# Patient Record
Sex: Male | Born: 1935 | Race: White | Hispanic: No | Marital: Married | Smoking: Former smoker
Health system: Southern US, Community
[De-identification: ages and names within clinical notes are randomized; demographics above are authoritative.]

## PROBLEM LIST (undated history)

## (undated) DIAGNOSIS — L905 Scar conditions and fibrosis of skin: Secondary | ICD-10-CM

## (undated) DIAGNOSIS — N429 Disorder of prostate, unspecified: Secondary | ICD-10-CM

## (undated) DIAGNOSIS — I519 Heart disease, unspecified: Secondary | ICD-10-CM

## (undated) DIAGNOSIS — H3530 Unspecified macular degeneration: Secondary | ICD-10-CM

## (undated) DIAGNOSIS — I2510 Atherosclerotic heart disease of native coronary artery without angina pectoris: Secondary | ICD-10-CM

## (undated) DIAGNOSIS — Z9861 Coronary angioplasty status: Secondary | ICD-10-CM

## (undated) DIAGNOSIS — I714 Abdominal aortic aneurysm, without rupture: Secondary | ICD-10-CM

## (undated) DIAGNOSIS — M1990 Unspecified osteoarthritis, unspecified site: Secondary | ICD-10-CM

## (undated) DIAGNOSIS — R251 Tremor, unspecified: Secondary | ICD-10-CM

## (undated) DIAGNOSIS — I2109 ST elevation (STEMI) myocardial infarction involving other coronary artery of anterior wall: Secondary | ICD-10-CM

## (undated) DIAGNOSIS — I4891 Unspecified atrial fibrillation: Secondary | ICD-10-CM

## (undated) HISTORY — PX: APPENDECTOMY: SHX54

## (undated) HISTORY — PX: OTHER SURGICAL HISTORY: SHX169

---

## 1998-03-24 HISTORY — PX: HERNIA REPAIR: SHX51

## 2001-11-12 ENCOUNTER — Encounter: Admission: RE | Admit: 2001-11-12 | Discharge: 2001-11-12 | Payer: Self-pay | Admitting: Internal Medicine

## 2001-11-12 ENCOUNTER — Encounter: Payer: Self-pay | Admitting: Internal Medicine

## 2001-11-26 ENCOUNTER — Ambulatory Visit (HOSPITAL_COMMUNITY): Admission: RE | Admit: 2001-11-26 | Discharge: 2001-11-26 | Payer: Self-pay | Admitting: Gastroenterology

## 2001-11-26 ENCOUNTER — Encounter (INDEPENDENT_AMBULATORY_CARE_PROVIDER_SITE_OTHER): Payer: Self-pay | Admitting: *Deleted

## 2002-12-30 ENCOUNTER — Encounter: Payer: Self-pay | Admitting: Internal Medicine

## 2002-12-30 ENCOUNTER — Encounter: Admission: RE | Admit: 2002-12-30 | Discharge: 2002-12-30 | Payer: Self-pay | Admitting: Internal Medicine

## 2003-01-12 ENCOUNTER — Ambulatory Visit: Admission: RE | Admit: 2003-01-12 | Discharge: 2003-01-12 | Payer: Self-pay | Admitting: Internal Medicine

## 2003-07-10 ENCOUNTER — Encounter: Admission: RE | Admit: 2003-07-10 | Discharge: 2003-07-10 | Payer: Self-pay | Admitting: Internal Medicine

## 2004-01-23 ENCOUNTER — Encounter: Admission: RE | Admit: 2004-01-23 | Discharge: 2004-01-23 | Payer: Self-pay | Admitting: Internal Medicine

## 2004-10-24 ENCOUNTER — Emergency Department (HOSPITAL_COMMUNITY): Admission: EM | Admit: 2004-10-24 | Discharge: 2004-10-24 | Payer: Self-pay | Admitting: Emergency Medicine

## 2005-02-25 ENCOUNTER — Encounter: Admission: RE | Admit: 2005-02-25 | Discharge: 2005-02-25 | Payer: Self-pay | Admitting: Internal Medicine

## 2006-05-01 ENCOUNTER — Encounter: Admission: RE | Admit: 2006-05-01 | Discharge: 2006-05-01 | Payer: Self-pay | Admitting: Internal Medicine

## 2008-06-01 ENCOUNTER — Ambulatory Visit (HOSPITAL_BASED_OUTPATIENT_CLINIC_OR_DEPARTMENT_OTHER): Admission: RE | Admit: 2008-06-01 | Discharge: 2008-06-01 | Payer: Self-pay | Admitting: Orthopedic Surgery

## 2009-05-23 ENCOUNTER — Encounter: Admission: RE | Admit: 2009-05-23 | Discharge: 2009-05-23 | Payer: Self-pay | Admitting: Internal Medicine

## 2010-07-04 LAB — BASIC METABOLIC PANEL
CO2: 25 mEq/L (ref 19–32)
Calcium: 9.7 mg/dL (ref 8.4–10.5)
Creatinine, Ser: 0.99 mg/dL (ref 0.4–1.2)
Glucose, Bld: 119 mg/dL — ABNORMAL HIGH (ref 70–99)
Potassium: 4.2 mEq/L (ref 3.5–5.1)

## 2010-08-06 NOTE — Op Note (Signed)
[name], [name] NO.:  [id number]   MEDICAL RECORD NO.:  [id number]          PATIENT TYPE:  AMB   LOCATION:  DSC                          FACILITY:  [facility]   PHYSICIAN:  Katy Fitch. Sypher, M.D. DATE OF BIRTH:  January 01, 1932   DATE OF PROCEDURE:  06/01/2008  DATE OF DISCHARGE:                               OPERATIVE REPORT   POSTOPERATIVE DIAGNOSES:  1. Severe degenerative arthritis, right thumb interphalangeal joint      with large dorsal radial loose body and mucoid cyst at dorsoradial      nail fold.  2. Chronic degenerative arthritis, right ring finger distal      interphalangeal joint with large loose body dorsally, small loose      body radially, and central dorsal nail fold mucoid cyst with nail      deformity.   POSTOPERATIVE DIAGNOSES:  1. Severe degenerative arthritis, right thumb interphalangeal joint      with large dorsal radial loose body and mucoid cyst at dorsoradial      nail fold.  2. Chronic degenerative arthritis, right ring finger distal      interphalangeal joint with large loose body dorsally, small loose      body radially, and central dorsal nail fold mucoid cyst with nail      deformity.   OPERATION:  1. Arthrotomy, right thumb interphalangeal joint with debridement of      loose bodies, synovectomy, and removal of marginal osteophytes from      the base of distal phalanx and proximal phalangeal head.  2. Debridement of nail fold mucoid cyst, right thumb.  3. Arthrotomy, right ring finger distal interphalangeal joint with      removal of a large central dorsal loose body, synovectomy, and      removal of multiple small loose bodies adjacent to the radial      collateral ligament.  4. Removal of a central dorsal nail fold cyst that was compressing the      germinal nail matrix.   OPERATIONS:  Katy Fitch. Sypher, MD   ASSISTANT:  Marveen Reeks. Dasnoit, PA-C   ANESTHESIA:  2% lidocaine metacarpal head level block, right thumb and  right ring finger, supplemented by IV sedation.   SUPERVISING ANESTHESIOLOGIST:  Maren Beach, MD   INDICATIONS:  [name] is a 77 year old gentleman who employed by  [employer].  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   He was referred through the courtesy of Dr. Theressa Millard for evaluation  and management of chronic discomfort, mucoid formation, and degenerative  arthritis signs involving his right hand.  He had a very large horn-like  osteophyte/loose body on the dorsal radial aspect of his right thumb IP  joint.  He had a large mucoid cyst distal to this in the radial nail  wall.  X-rays revealed marked degenerative arthritis of all his  interphalangeal joints with a very large fractured osteophyte/loose body  in the IP joint of the right thumb.  X-rays of the ring finger revealed  a small  loose body deep to the radial collateral ligament and large  loose body dorsally.  He had a central dorsal nail fold cyst involving  the ring finger that was causing deformity of the nail.   He requested debridement.  We explained that we could debride the  joints, remove the loose bodies, and typically the mucoid cyst will  abate.  Once the pressure of the cyst is removed, his nail deformity  should correct overtime.   After informed consent, [name] is brought to the operating room at  this time.   PROCEDURE:  [name] was brought to room #8 of the [facility] and placed in the supine position upon the operating table.  Following Betadine prep, 2% lidocaine metacarpal head level blocks were  placed to obtain anesthesia of the right thumb and right ring finger.  As anesthesia set up, the arm and hand were prepped with Betadine soap  and solution, sterilely draped.  A pneumatic tourniquet was not  required.   The thumb was exsanguinated with a gauze wrap and a strip of Esmarch was  used as a digital tourniquet.  The  procedure commenced with an  elliptical excision of the mucoid cyst followed by a curvilinear  incision exposing the dorsoradial aspect of the IP joint.  A large  fractured osteophyte was debrided measuring 6 x 5 mm.  A complete  synovectomy was accomplished over the dorsal aspect of the IP joint  followed by power irrigation with a 20 mL syringe and a 19-gauge blunt  needle.  A microcurette was used to remove osteophytes at the proximal  phalangeal head and at the base of the distal phalanx.  We could not  identify other loose bodies due to the collateral ligaments.   The skin adjacent to the mucoid cyst was debrided with micro-rongeur.  The wound was then repaired with mattress sutures of 5-0 nylon tailoring  the defect created by cyst excision.  Attention was then directed to the  ring finger.  The ring finger was exsanguinated with a gauze wrap and a  strip of Esmarch was used as a digital tourniquet.  The dorsoradial  aspect of the joint was exposed through a curvilinear incision followed  by resection of the capsule between the terminal extensor tendon slip  and the radial collateral ligament.  Debridement of the joint removed a  4 x 3 mm loose body and a smaller loose body deep to the radial  collateral ligament.  After synovectomy, marginal osteophytes were  trimmed with a micro-rongeur and a microcurette.  The mucoid cyst was  then debrided through a second incision dorsally.  The connection  between the joint and the cyst was sounded and appeared to be coming  directly through the central portion of the extensor tendon.   After thorough debridement, the joint was irrigated with a 19 gauge  needle under pressure followed by repair of the skin wounds with  interrupted suture of 5-0 nylon.   The tourniquet was released after dressings were applied with Xeroflo  sterile gauze and Coban.  There were no apparent complications.   [name] tolerated the surgery and anesthesia well.   For aftercare, he  is provided a prescription for Vicodin 5 mg 1 p.o. q.4-6 hours p.r.n.  pain, 20 tablets without refill and Keflex 500 mg 1 p.o. q.8 h. x4 days  as prophylactic antibiotic.   He will return to see [place] in the office for followup in 1 week  or sooner  p.r.n. problems.      Katy Fitch Sypher, M.D.  Electronically Signed    RVS/MEDQ  D:  06/01/2008  T:  06/02/2008  Job:  [id number]   cc:   Theressa Millard, M.D.

## 2010-08-09 NOTE — Op Note (Signed)
   NAME:  [name], [name] NO.:  [id number]   MEDICAL RECORD NO.:  [id number]                   PATIENT TYPE:  AMB   LOCATION:  ENDO                                 FACILITY:  [facility]   PHYSICIAN:  Charolett Bumpers, M.D.             DATE OF BIRTH:  1931/11/16   DATE OF PROCEDURE:  DATE OF DISCHARGE:  11/26/2001                                 OPERATIVE REPORT   PROCEDURE INDICATION:  The patient is a 77 year-old male born May 12, 1931.  The patient has had recurrent bouts of diverticulitis.  He is  scheduled to undergo diagnostic colonoscopy.  I discussed with the patient  the complications associated with colonoscopy and polypectomy including a 15  per 1000 risk of bleeding and 4 per 1000 risk of colon perforation requiring  surgical repair.  The patient has signed the operative permit.   ENDOSCOPIST:  Charolett Bumpers, M.D.   PREMEDICATION:  Versed 5 mg and Fentanyl 50 mcg.   ENDOSCOPE:  Olympus pediatric colonoscope.   PROCEDURE:  After obtaining informed consent the patient was placed in the  left lateral decubitus position.  I administered intravenous Fentanyl and  intravenous Versed to achieve conscious sedation for the procedure.  The  patient's blood pressure, oxygen saturation and cardiac rhythm were  monitored throughout the procedure and documented in the medical record.   Anal inspection was normal.  Digital rectal exam was normal.  The prostate  is non-nodular.  The Olympus pediatric video colonoscope was introduced into  the rectum and easily advanced to the cecum.  Colonic preparation for the  exam today was excellent.   Rectum:  Normal.   Sigmoid colon, descending colon and splenic flexure:  Left colonic  diverticulosis is noted.  There is segmental colitis involving the splenic  flexure and descending colon.  The colonic mucosa is red and friable without  ulcers.  Multiple biopsies were taken.   Transverse colon:   Normal.   Hepatic flexure:  Normal.   Ascending colon:  Normal.   Cecum and ileocecal valve:  Normal.    ASSESSMENT:  Left colonic diverticulosis associated with a short segment of  mucosal colitis involving the splenic flexure and proximal descending colon  with biopsies pending.  No endoscopic evidence for the presence of  colorectal neoplasia.                                               Charolett Bumpers, M.D.    MKJ/MEDQ  D:  11/26/2001  T:  11/28/2001  Job:  [id number]   cc:   Winn Jock. [name], M.D.

## 2011-01-08 ENCOUNTER — Other Ambulatory Visit: Payer: Self-pay | Admitting: Dermatology

## 2012-06-23 ENCOUNTER — Other Ambulatory Visit: Payer: Self-pay | Admitting: Dermatology

## 2013-01-12 ENCOUNTER — Other Ambulatory Visit: Payer: Self-pay | Admitting: Internal Medicine

## 2013-01-12 DIAGNOSIS — I714 Abdominal aortic aneurysm, without rupture, unspecified: Secondary | ICD-10-CM

## 2013-01-14 ENCOUNTER — Ambulatory Visit
Admission: RE | Admit: 2013-01-14 | Discharge: 2013-01-14 | Disposition: A | Discharge status: Home / Self Care | Payer: Medicare Other | Source: Ambulatory Visit | Attending: Internal Medicine | Admitting: Internal Medicine

## 2013-01-14 DIAGNOSIS — I714 Abdominal aortic aneurysm, without rupture, unspecified: Secondary | ICD-10-CM

## 2013-02-10 ENCOUNTER — Encounter (HOSPITAL_COMMUNITY)
Admission: EM | Disposition: A | Discharge status: Home / Self Care | Payer: Medicare Other | Source: Home / Self Care | Attending: Cardiology

## 2013-02-10 ENCOUNTER — Ambulatory Visit (HOSPITAL_COMMUNITY): Admit: 2013-02-10 | Payer: Self-pay | Admitting: Cardiology

## 2013-02-10 ENCOUNTER — Encounter (HOSPITAL_BASED_OUTPATIENT_CLINIC_OR_DEPARTMENT_OTHER): Payer: Self-pay | Admitting: Emergency Medicine

## 2013-02-10 ENCOUNTER — Emergency Department (HOSPITAL_BASED_OUTPATIENT_CLINIC_OR_DEPARTMENT_OTHER): Payer: Medicare Other

## 2013-02-10 ENCOUNTER — Inpatient Hospital Stay (HOSPITAL_BASED_OUTPATIENT_CLINIC_OR_DEPARTMENT_OTHER)
Admission: EM | Admit: 2013-02-10 | Discharge: 2013-02-16 | DRG: 246 | Disposition: A | Discharge status: Home / Self Care | Payer: Medicare Other | Attending: Cardiology | Admitting: Cardiology

## 2013-02-10 DIAGNOSIS — I4819 Other persistent atrial fibrillation: Secondary | ICD-10-CM | POA: Diagnosis not present

## 2013-02-10 DIAGNOSIS — I2510 Atherosclerotic heart disease of native coronary artery without angina pectoris: Secondary | ICD-10-CM

## 2013-02-10 DIAGNOSIS — Z7982 Long term (current) use of aspirin: Secondary | ICD-10-CM

## 2013-02-10 DIAGNOSIS — I519 Heart disease, unspecified: Secondary | ICD-10-CM | POA: Diagnosis not present

## 2013-02-10 DIAGNOSIS — E119 Type 2 diabetes mellitus without complications: Secondary | ICD-10-CM | POA: Diagnosis present

## 2013-02-10 DIAGNOSIS — R030 Elevated blood-pressure reading, without diagnosis of hypertension: Secondary | ICD-10-CM | POA: Diagnosis present

## 2013-02-10 DIAGNOSIS — I714 Abdominal aortic aneurysm, without rupture, unspecified: Secondary | ICD-10-CM | POA: Diagnosis present

## 2013-02-10 DIAGNOSIS — E785 Hyperlipidemia, unspecified: Secondary | ICD-10-CM | POA: Diagnosis present

## 2013-02-10 DIAGNOSIS — Z87891 Personal history of nicotine dependence: Secondary | ICD-10-CM

## 2013-02-10 DIAGNOSIS — Z8679 Personal history of other diseases of the circulatory system: Secondary | ICD-10-CM | POA: Diagnosis not present

## 2013-02-10 DIAGNOSIS — I4891 Unspecified atrial fibrillation: Secondary | ICD-10-CM | POA: Diagnosis present

## 2013-02-10 DIAGNOSIS — I10 Essential (primary) hypertension: Secondary | ICD-10-CM | POA: Diagnosis present

## 2013-02-10 DIAGNOSIS — I739 Peripheral vascular disease, unspecified: Secondary | ICD-10-CM | POA: Diagnosis present

## 2013-02-10 DIAGNOSIS — R570 Cardiogenic shock: Secondary | ICD-10-CM

## 2013-02-10 DIAGNOSIS — I5041 Acute combined systolic (congestive) and diastolic (congestive) heart failure: Secondary | ICD-10-CM

## 2013-02-10 DIAGNOSIS — Z955 Presence of coronary angioplasty implant and graft: Secondary | ICD-10-CM

## 2013-02-10 DIAGNOSIS — I213 ST elevation (STEMI) myocardial infarction of unspecified site: Secondary | ICD-10-CM

## 2013-02-10 DIAGNOSIS — I4901 Ventricular fibrillation: Secondary | ICD-10-CM | POA: Diagnosis present

## 2013-02-10 DIAGNOSIS — I2109 ST elevation (STEMI) myocardial infarction involving other coronary artery of anterior wall: Principal | ICD-10-CM | POA: Diagnosis present

## 2013-02-10 HISTORY — DX: Unspecified atrial fibrillation: I48.91

## 2013-02-10 HISTORY — DX: Abdominal aortic aneurysm, without rupture: I71.4

## 2013-02-10 HISTORY — DX: ST elevation (STEMI) myocardial infarction involving other coronary artery of anterior wall: I21.09

## 2013-02-10 HISTORY — DX: Atherosclerotic heart disease of native coronary artery without angina pectoris: I25.10

## 2013-02-10 HISTORY — PX: PERCUTANEOUS CORONARY STENT INTERVENTION (PCI-S): SHX6016

## 2013-02-10 HISTORY — DX: Disorder of prostate, unspecified: N42.9

## 2013-02-10 HISTORY — DX: Coronary angioplasty status: Z98.61

## 2013-02-10 HISTORY — DX: Heart disease, unspecified: I51.9

## 2013-02-10 HISTORY — PX: LEFT HEART CATHETERIZATION WITH CORONARY ANGIOGRAM: SHX5451

## 2013-02-10 LAB — COMPREHENSIVE METABOLIC PANEL
Albumin: 4.2 g/dL (ref 3.5–5.2)
Alkaline Phosphatase: 65 U/L (ref 39–117)
BUN: 23 mg/dL (ref 6–23)
CO2: 22 mEq/L (ref 19–32)
Calcium: 10.3 mg/dL (ref 8.4–10.5)
Chloride: 100 mEq/L (ref 96–112)
Creatinine, Ser: 1.3 mg/dL (ref 0.50–1.35)
GFR calc Af Amer: 59 mL/min — ABNORMAL LOW (ref >90)
Glucose, Bld: 191 mg/dL — ABNORMAL HIGH (ref 70–99)
Potassium: 4.9 mEq/L (ref 3.5–5.1)
Sodium: 135 mEq/L (ref 135–145)
Total Protein: 7.7 g/dL (ref 6.0–8.3)

## 2013-02-10 LAB — MRSA PCR SCREENING: MRSA by PCR: NEGATIVE

## 2013-02-10 LAB — POCT I-STAT 3, ART BLOOD GAS (G3+)
Bicarbonate: 17.6 mEq/L — ABNORMAL LOW (ref 20.0–24.0)
O2 Saturation: 97 %
TCO2: 19 mmol/L (ref 0–100)
pCO2 arterial: 38.9 mmHg (ref 35.0–45.0)
pH, Arterial: 7.265 — ABNORMAL LOW (ref 7.350–7.450)
pO2, Arterial: 109 mmHg — ABNORMAL HIGH (ref 80.0–100.0)

## 2013-02-10 LAB — CBC
HCT: 49 % (ref 39.0–52.0)
MCV: 91.2 fL (ref 78.0–100.0)
Platelets: 202 10*3/uL (ref 150–400)
RBC: 5.37 MIL/uL (ref 4.22–5.81)
RDW: 14.2 % (ref 11.5–15.5)
WBC: 9.7 10*3/uL (ref 4.0–10.5)

## 2013-02-10 SURGERY — LEFT HEART CATHETERIZATION WITH CORONARY ANGIOGRAM
Anesthesia: LOCAL

## 2013-02-10 MED ORDER — FLUMAZENIL 1 MG/10ML IV SOLN
INTRAVENOUS | Status: AC
  Filled 2013-02-10: qty 10

## 2013-02-10 MED ORDER — HEPARIN (PORCINE) IN NACL 2-0.9 UNIT/ML-% IJ SOLN
INTRAMUSCULAR | Status: AC
  Filled 2013-02-10: qty 1000

## 2013-02-10 MED ORDER — SODIUM CHLORIDE 0.9 % IV SOLN
1000.0000 mL | INTRAVENOUS | Status: DC
  Administered 2013-02-10: 1000 mL via INTRAVENOUS

## 2013-02-10 MED ORDER — MIDAZOLAM HCL 2 MG/2ML IJ SOLN
INTRAMUSCULAR | Status: AC
  Filled 2013-02-10: qty 2

## 2013-02-10 MED ORDER — AMIODARONE HCL IN DEXTROSE 360-4.14 MG/200ML-% IV SOLN
60.0000 mg/h | INTRAVENOUS | Status: AC
  Administered 2013-02-10 (×2): 60 mg/h via INTRAVENOUS
  Filled 2013-02-10 (×2): qty 200

## 2013-02-10 MED ORDER — SODIUM CHLORIDE 0.9 % IJ SOLN: 3.0000 mL | INTRAMUSCULAR | Status: DC | PRN

## 2013-02-10 MED ORDER — BIVALIRUDIN 250 MG IV SOLR
INTRAVENOUS | Status: AC
  Filled 2013-02-10: qty 250

## 2013-02-10 MED ORDER — AMIODARONE HCL IN DEXTROSE 360-4.14 MG/200ML-% IV SOLN
30.0000 mg/h | INTRAVENOUS | Status: DC
  Filled 2013-02-10 (×2): qty 200

## 2013-02-10 MED ORDER — MORPHINE SULFATE 4 MG/ML IJ SOLN
INTRAMUSCULAR | Status: AC
  Administered 2013-02-10: 10:00:00
  Filled 2013-02-10: qty 1

## 2013-02-10 MED ORDER — ONDANSETRON HCL 4 MG/2ML IJ SOLN
4.0000 mg | Freq: Four times a day (QID) | INTRAMUSCULAR | Status: DC | PRN
  Filled 2013-02-10: qty 2

## 2013-02-10 MED ORDER — ASPIRIN 81 MG PO CHEW
81.0000 mg | CHEWABLE_TABLET | Freq: Every day | ORAL | Status: DC
  Administered 2013-02-11 – 2013-02-16 (×6): 81 mg via ORAL
  Filled 2013-02-10 (×6): qty 1

## 2013-02-10 MED ORDER — LIDOCAINE HCL (PF) 1 % IJ SOLN
INTRAMUSCULAR | Status: AC
  Filled 2013-02-10: qty 30

## 2013-02-10 MED ORDER — TICAGRELOR 90 MG PO TABS
90.0000 mg | ORAL_TABLET | Freq: Two times a day (BID) | ORAL | Status: DC
  Administered 2013-02-10 – 2013-02-16 (×12): 90 mg via ORAL
  Filled 2013-02-10 (×13): qty 1

## 2013-02-10 MED ORDER — NITROGLYCERIN 0.2 MG/ML ON CALL CATH LAB
INTRAVENOUS | Status: AC
  Filled 2013-02-10: qty 1

## 2013-02-10 MED ORDER — FUROSEMIDE 10 MG/ML IJ SOLN
20.0000 mg | Freq: Two times a day (BID) | INTRAMUSCULAR | Status: DC
  Administered 2013-02-10 – 2013-02-14 (×8): 20 mg via INTRAVENOUS
  Filled 2013-02-10 (×9): qty 2

## 2013-02-10 MED ORDER — ATORVASTATIN CALCIUM 80 MG PO TABS
80.0000 mg | ORAL_TABLET | Freq: Every day | ORAL | Status: DC
  Administered 2013-02-10 – 2013-02-15 (×6): 80 mg via ORAL
  Filled 2013-02-10 (×7): qty 1

## 2013-02-10 MED ORDER — ALLOPURINOL 100 MG PO TABS
100.0000 mg | ORAL_TABLET | Freq: Every day | ORAL | Status: DC
  Administered 2013-02-11 – 2013-02-16 (×6): 100 mg via ORAL
  Filled 2013-02-10 (×6): qty 1

## 2013-02-10 MED ORDER — NITROGLYCERIN 0.4 MG SL SUBL
SUBLINGUAL_TABLET | SUBLINGUAL | Status: AC
  Administered 2013-02-10: 10:00:00
  Filled 2013-02-10: qty 25

## 2013-02-10 MED ORDER — SODIUM CHLORIDE 0.9 % IV SOLN
2.0000 mg/h | INTRAVENOUS | Status: AC
  Filled 2013-02-10: qty 10

## 2013-02-10 MED ORDER — DOPAMINE-DEXTROSE 3.2-5 MG/ML-% IV SOLN
2.0000 ug/kg/min | INTRAVENOUS | Status: DC
  Administered 2013-02-10: 10 ug/kg/min via INTRAVENOUS
  Administered 2013-02-11: 12 ug/kg/min via INTRAVENOUS
  Administered 2013-02-11: 10 ug/kg/min via INTRAVENOUS
  Administered 2013-02-12: 1 ug/kg/min via INTRAVENOUS
  Administered 2013-02-12: 7 ug/kg/min via INTRAVENOUS
  Filled 2013-02-10 (×3): qty 250

## 2013-02-10 MED ORDER — ACETAMINOPHEN 325 MG PO TABS
650.0000 mg | ORAL_TABLET | ORAL | Status: DC | PRN
  Administered 2013-02-11 (×2): 650 mg via ORAL
  Filled 2013-02-10 (×2): qty 2

## 2013-02-10 MED ORDER — ALUM & MAG HYDROXIDE-SIMETH 200-200-20 MG/5ML PO SUSP
30.0000 mL | ORAL | Status: DC | PRN
Start: 2013-02-10 — End: 2013-02-16
  Administered 2013-02-10: 30 mL via ORAL
  Filled 2013-02-10: qty 30

## 2013-02-10 MED ORDER — SODIUM CHLORIDE 0.9 % IV SOLN
0.2500 mg/kg/h | INTRAVENOUS | Status: DC
  Filled 2013-02-10 (×2): qty 250

## 2013-02-10 MED ORDER — SODIUM CHLORIDE 0.9 % IV SOLN
250.0000 mL | INTRAVENOUS | Status: DC | PRN
  Administered 2013-02-10: 13:00:00 via INTRAVENOUS
  Administered 2013-02-11: 250 mL via INTRAVENOUS

## 2013-02-10 MED ORDER — HEPARIN SODIUM (PORCINE) 5000 UNIT/ML IJ SOLN
INTRAMUSCULAR | Status: AC
  Filled 2013-02-10: qty 1

## 2013-02-10 MED ORDER — FUROSEMIDE 10 MG/ML IJ SOLN
20.0000 mg | Freq: Once | INTRAMUSCULAR | Status: AC
  Administered 2013-02-10: 15:00:00 via INTRAVENOUS

## 2013-02-10 MED ORDER — SODIUM CHLORIDE 0.9 % IV SOLN
1000.0000 mL | Freq: Once | INTRAVENOUS | Status: AC
  Administered 2013-02-10: 1000 mL via INTRAVENOUS

## 2013-02-10 MED ORDER — SODIUM CHLORIDE 0.9 % IV SOLN
1.0000 mL/kg/h | INTRAVENOUS | Status: AC
  Administered 2013-02-10: 1 mL/kg/h via INTRAVENOUS

## 2013-02-10 MED ORDER — SODIUM CHLORIDE 0.9 % IV SOLN
50.0000 ug/h | INTRAVENOUS | Status: AC
  Filled 2013-02-10: qty 50

## 2013-02-10 MED ORDER — ONDANSETRON HCL 4 MG/2ML IJ SOLN
INTRAMUSCULAR | Status: AC
  Filled 2013-02-10: qty 2

## 2013-02-10 MED ORDER — ASPIRIN 81 MG PO CHEW
CHEWABLE_TABLET | ORAL | Status: AC
  Administered 2013-02-10: 10:00:00
  Filled 2013-02-10: qty 4

## 2013-02-10 MED ORDER — FUROSEMIDE 10 MG/ML IJ SOLN
INTRAMUSCULAR | Status: AC
  Administered 2013-02-10: 20 mg via INTRAVENOUS
  Filled 2013-02-10: qty 4

## 2013-02-10 MED ORDER — HEPARIN BOLUS VIA INFUSION
4000.0000 [IU] | Freq: Once | INTRAVENOUS | Status: AC
  Administered 2013-02-10: 4000 [IU] via INTRAVENOUS

## 2013-02-10 MED ORDER — FUROSEMIDE 10 MG/ML IJ SOLN
20.0000 mg | Freq: Once | INTRAMUSCULAR | Status: AC
  Administered 2013-02-10: 20 mg via INTRAVENOUS

## 2013-02-10 MED ORDER — SODIUM CHLORIDE 0.9 % IJ SOLN
3.0000 mL | Freq: Two times a day (BID) | INTRAMUSCULAR | Status: DC
  Administered 2013-02-10 – 2013-02-15 (×11): 3 mL via INTRAVENOUS

## 2013-02-10 MED ORDER — TICAGRELOR 90 MG PO TABS
ORAL_TABLET | ORAL | Status: AC
  Filled 2013-02-10: qty 1

## 2013-02-10 NOTE — ED Provider Notes (Signed)
CSN: [id number]     Arrival date & time 02/10/13  4098 History   First MD Initiated Contact with Patient 02/10/13 0945     Chief Complaint  Patient presents with  . Chest Pain   (Consider location/radiation/quality/duration/timing/severity/associated sxs/prior Treatment) Patient is a 79 y.o. male presenting with chest pain.  Chest Pain Associated symptoms: diaphoresis and nausea   Associated symptoms: no abdominal pain, no shortness of breath and not vomiting     79 year old male here with chest pain. Complains of 10 out of 10 substernal chest pressure-like pain that intermittently radiating to the back. Started last at 6 PM and resolved overnight, started again this morning prompting his trip to the ED. Has associated nausea without vomiting and diaphoresis.  Denies dyspnea. Pain is helped minimally by morphine and nitroglycerin. Past Medical History  Diagnosis Date  . Hypertension   . Prostate disease   . Abdominal aortic aneurysm    Past Surgical History  Procedure Laterality Date  . Appendectomy     No family history on file. History  Substance Use Topics  . Smoking status: Former Games developer  . Smokeless tobacco: Not on file  . Alcohol Use: No    Review of Systems  Constitutional: Positive for diaphoresis.  Respiratory: Negative for shortness of breath.   Cardiovascular: Positive for chest pain.  Gastrointestinal: Positive for nausea. Negative for vomiting and abdominal pain.  All other systems reviewed and are negative.    Allergies  Ciprofloxacin  Home Medications   Current Outpatient Rx  Name  Route  Sig  Dispense  Refill  . allopurinol (ZYLOPRIM) 100 MG tablet   Oral   Take 100 mg by mouth daily.         Marland Kitchen aspirin 81 MG tablet   Oral   Take 81 mg by mouth daily.         Marland Kitchen atenolol (TENORMIN) 100 MG tablet   Oral   Take 100 mg by mouth daily.         Marland Kitchen dutasteride (AVODART) 0.5 MG capsule   Oral   Take 0.5 mg by mouth daily.         Marland Kitchen  GLUCOSAMINE HCL PO   Oral   Take by mouth.         Marland Kitchen lisinopril (PRINIVIL,ZESTRIL) 10 MG tablet   Oral   Take 10 mg by mouth daily.         . tamsulosin (FLOMAX) 0.4 MG CAPS capsule   Oral   Take 0.4 mg by mouth.          BP 104/74  Pulse 70  Temp(Src) 97.6 F (36.4 C) (Oral)  Resp 18  Ht 5\' 10"  (1.778 m)  Wt 192 lb (87.091 kg)  BMI 27.55 kg/m2  SpO2 100% Physical Exam  Nursing note and vitals reviewed. Constitutional: He is oriented to person, place, and time. He appears well-developed and well-nourished. He appears distressed.  HENT:  Head: Normocephalic and atraumatic.  Eyes: EOM are normal. Pupils are equal, round, and reactive to light.  Neck: Normal range of motion. Neck supple.  Cardiovascular: Normal rate, regular rhythm and normal heart sounds.   Pulmonary/Chest: Effort normal and breath sounds normal. No respiratory distress. He has no wheezes. He exhibits no tenderness.  Abdominal: Soft. Bowel sounds are normal. There is no tenderness.  Musculoskeletal: He exhibits no edema.  Neurological: He is alert and oriented to person, place, and time.  Skin: Skin is warm. He is diaphoretic.  Psychiatric: His  mood appears anxious.    ED Course  Procedures (including critical care time) Labs Review Labs Reviewed  COMPREHENSIVE METABOLIC PANEL - Abnormal; Notable for the following:    Glucose, Bld 191 (*)    GFR calc non Af Amer 51 (*)    GFR calc Af Amer 59 (*)    All other components within normal limits  PRO B NATRIURETIC PEPTIDE  CBC  TROPONIN I   Imaging Review Dg Chest Portable 1 View  02/10/2013   CLINICAL DATA:  Chest pain.  EXAM: PORTABLE CHEST - 1 VIEW  COMPARISON:  None.  FINDINGS: Mediastinum and hilar structures are normal. Borderline cardiomegaly with pulmonary vascular prominence and interstitial prominence with Kerley B lines. Tiny left pleural effusion cannot be excluded. These findings consistent with congestive heart failure with pulmonary  interstitial edema. No pneumothorax. No acute osseous abnormality.  IMPRESSION: Congestive heart failure with pulmonary interstitial edema.   Electronically Signed   By: Maisie Fus  Register   On: 02/10/2013 10:08    EKG Interpretation   None       MDM   1. STEMI (ST elevation myocardial infarction)     79 year old male here with evolving STEMI> Initial EKG showing depression in 2, 3, and AVf, repeat 20 minutes later shows diffuse elevation in anterior leads.   Given Nitro,morphine, O2,  2 L NS bolus for for transient hypotension to systolic of 60s, ASA, and started on a heparin drip. HE did take his atenolol at home this am.   Attending discused case with cardiology and [facility] EDP at [facility] and patient was [facility]  For further treatment.   Murtis Sink, MD [facility] Resident, PGY-2 02/10/2013, 10:46 AM       Elenora Gamma, MD 02/10/13 1046

## 2013-02-10 NOTE — ED Provider Notes (Signed)
I saw and evaluated the patient, reviewed the resident's note and I agree with the findings and plan.   .Face to face Exam:  General:  Awake, complaining of substernal chest pain with occasional radiation to the back.  Complaining of diaphoresis and nausea. HEENT:  Atraumatic Resp:  Normal effort Abd:  Nondistended Neuro:No focal weakness  Patient had dramatic EKG changes between the first and second EKGs consistent with acute anterior ST elevation MI.  At the time the EKG was done I was talking to the cardiologist, Dr. Herbie Baltimore, who directed me to transfer patient immediately to catheter lab at [facility] which was done.  At time of discharge patient was awake alert and conversing.  Patient still having chest pain but it seemed to have improved.  CRITICAL CARE Performed by: Nelia Shi Total critical care time: 45 min Critical care time was exclusive of separately billable procedures and treating other patients. Critical care was necessary to treat or prevent imminent or life-threatening deterioration. Critical care was time spent personally by me on the following activities: development of treatment plan with patient and/or surrogate as well as nursing, discussions with consultants, evaluation of patient's response to treatment, examination of patient, obtaining history from patient or surrogate, ordering and performing treatments and interventions, ordering and review of laboratory studies, ordering and review of radiographic studies, pulse oximetry and re-evaluation of patient's condition.     Nelia Shi, MD 02/10/13 [phone]

## 2013-02-10 NOTE — ED Notes (Signed)
Transferred to [facility] via [facility]

## 2013-02-10 NOTE — Progress Notes (Signed)
Patient came in as [name] and post CPR. Pt. was immediately taken to Cath Lab . I visited with patient family in consult room 5 and provided emotional and spiritual support. There was presence with patient wife a [facility] staff member who took the time on her way to work  Help pt.'s wife by escorting her to cath lab waiting and remained with her son arrived.  I assisted her in directing her son to her location and offered our services. Will follow as needed and pass on to unit Chaplain for continued support.  02/10/13 1200  Clinical Encounter Type  Visited With Family;Health care provider  Visit Type Spiritual support;Patient in surgery;Trauma  Referral From Nurse  Spiritual Encounters  Spiritual Needs Emotional  Stress Factors  Family Stress Factors Health changes  .Marland KitchenMarland KitchenVenida Jarvis, [place], pager [phone]

## 2013-02-10 NOTE — ED Notes (Signed)
Pt c/o chest pain since yesterday. Pt sts pain radiates to back occasionally. Denies n/v/shob. Denies dizziness.

## 2013-02-10 NOTE — H&P (Signed)
Chief Complaint: Chest Pain  HPI: The patient is a 79 y/o male, with a history of HTN, HLD and AAA, who initially presented to [facility] with a complaint of severe 10/10 chest pain x 1 days and diaphroesis. EKG demonstrated anterolateral ST elevations. Code STEMI was activated and he was transported urgently to the [facility] Cath lab for emergent cardiac catheterization. In the field, he went into VF and was shocked x 2. CPR was performed for 2 minutes. On arrival to [facility], he was stable and taken to the cath lab. He was given 4 baby ASA, IV heparin and IV Amiodarone was initiated.    Past Medical History  Diagnosis Date  . Hypertension   . Prostate disease   . Abdominal aortic aneurysm     Past Surgical History  Procedure Laterality Date  . Appendectomy      No family history on file. Social History:  reports that he has quit smoking. He does not have any smokeless tobacco history on file. He reports that he does not drink alcohol or use illicit drugs.  Allergies:  Allergies  Allergen Reactions  . Ciprofloxacin     Medications Prior to Admission  Medication Sig Dispense Refill  . allopurinol (ZYLOPRIM) 100 MG tablet Take 100 mg by mouth daily.      Marland Kitchen aspirin 81 MG tablet Take 81 mg by mouth daily.      Marland Kitchen atenolol (TENORMIN) 100 MG tablet Take 100 mg by mouth daily.      Marland Kitchen dutasteride (AVODART) 0.5 MG capsule Take 0.5 mg by mouth daily.      Marland Kitchen GLUCOSAMINE HCL PO Take by mouth.      Marland Kitchen lisinopril (PRINIVIL,ZESTRIL) 10 MG tablet Take 10 mg by mouth daily.      . tamsulosin (FLOMAX) 0.4 MG CAPS capsule Take 0.4 mg by mouth.        Results for orders placed during the hospital encounter of 02/10/13 (from the past 48 hour(s))  PRO B NATRIURETIC PEPTIDE     Status: None   Collection Time    02/10/13  9:55 AM      Result Value Range   Pro B Natriuretic peptide (BNP) 215.4  0 - 450 pg/mL  CBC     Status: None   Collection Time    02/10/13  9:55 AM      Result Value  Range   WBC 9.7  4.0 - 10.5 K/uL   RBC 5.37  4.22 - 5.81 MIL/uL   Hemoglobin 16.5  13.0 - 17.0 g/dL   HCT 78.2  95.6 - 21.3 %   MCV 91.2  78.0 - 100.0 fL   MCH 30.7  26.0 - 34.0 pg   MCHC 33.7  30.0 - 36.0 g/dL   RDW 08.6  57.8 - 46.9 %   Platelets 202  150 - 400 K/uL  COMPREHENSIVE METABOLIC PANEL     Status: Abnormal   Collection Time    02/10/13  9:55 AM      Result Value Range   Sodium 135  135 - 145 mEq/L   Potassium 4.9  3.5 - 5.1 mEq/L   Chloride 100  96 - 112 mEq/L   CO2 22  19 - 32 mEq/L   Glucose, Bld 191 (*) 70 - 99 mg/dL   BUN 23  6 - 23 mg/dL   Creatinine, Ser 6.29  0.50 - 1.35 mg/dL   Calcium 52.8  8.4 - 41.3 mg/dL  Total Protein 7.7  6.0 - 8.3 g/dL   Albumin 4.2  3.5 - 5.2 g/dL   AST 36  0 - 37 U/L   Comment: SLIGHT HEMOLYSIS     HEMOLYSIS AT THIS LEVEL MAY AFFECT RESULT   ALT 30  0 - 53 U/L   Alkaline Phosphatase 65  39 - 117 U/L   Total Bilirubin 0.4  0.3 - 1.2 mg/dL   GFR calc non Af Amer 51 (*) >90 mL/min   GFR calc Af Amer 59 (*) >90 mL/min   Comment: (NOTE)     The eGFR has been calculated using the CKD EPI equation.     This calculation has not been validated in all clinical situations.     eGFR's persistently <90 mL/min signify possible Chronic Kidney     Disease.  TROPONIN I     Status: None   Collection Time    02/10/13  9:55 AM      Result Value Range   Troponin I <0.30  <0.30 ng/mL   Comment:            Due to the release kinetics of cTnI,     a negative result within the first hours     of the onset of symptoms does not rule out     myocardial infarction with certainty.     If myocardial infarction is still suspected,     repeat the test at appropriate intervals.   Dg Chest Portable 1 View  02/10/2013   CLINICAL DATA:  Chest pain.  EXAM: PORTABLE CHEST - 1 VIEW  COMPARISON:  None.  FINDINGS: Mediastinum and hilar structures are normal. Borderline cardiomegaly with pulmonary vascular prominence and interstitial prominence with Kerley B  lines. Tiny left pleural effusion cannot be excluded. These findings consistent with congestive heart failure with pulmonary interstitial edema. No pneumothorax. No acute osseous abnormality.  IMPRESSION: Congestive heart failure with pulmonary interstitial edema.   Electronically Signed   By: Maisie Fus  Register   On: 02/10/2013 10:08    Review of Systems  Constitutional: Positive for diaphoresis.  Cardiovascular: Positive for chest pain.  All other systems reviewed and are negative.    Blood pressure 104/74, pulse 70, temperature 97.6 F (36.4 C), temperature source Oral, resp. rate 18, height 5\' 10"  (1.778 m), weight 192 lb (87.091 kg), SpO2 100.00%. Physical Exam  Constitutional: He is oriented to person, place, and time. He appears well-developed and well-nourished.  Cardiovascular: Normal rate, regular rhythm, normal heart sounds and intact distal pulses.  Exam reveals no gallop and no friction rub.   No murmur heard. Pulses:      Radial pulses are 2+ on the right side, and 2+ on the left side.       Dorsalis pedis pulses are 2+ on the right side, and 2+ on the left side.  Respiratory: Effort normal and breath sounds normal. No respiratory distress. He has no wheezes.  Musculoskeletal: He exhibits no edema.  Neurological: He is alert and oriented to person, place, and time.  Skin:  Dry but cool  Psychiatric: He has a normal mood and affect. His behavior is normal.     Assessment/Plan Principal Problem:   STEMI (ST elevation myocardial infarction) Active Problems:   HTN (hypertension)   HLD (hyperlipidemia)   AAA (abdominal aortic aneurysm)  Plan: Proceed to cath lab for emergent PCI in setting of anterolatearl STEMI.  Allayne Butcher, PA-C 02/10/2013, 12:29 PM  I have seen and evaluated the patient  this PM along with Boyce Medici, PA, or [place], [place]. I agree with her findings, examination as well as impression recommendations.  Has a history of hypertension,  diabetes and dyslipidemia. He is a former smoker with a moderate sized AAA.  Began having SSCP last PM - came & went x 2, but returned this AM - more severe & did not go away.  Was getting ready to do yard work @ the [employer], but friend took him home & wife called EMS --> [facility], initial ECG with Inferolateral ST depressions.  CP did not go away & follow-on ECG showed Anterior STE (~1-2 mm in V1, V3-V6, 3-4 mm in V2) --> CODE STEMI called.  En route to [facility], VF with CPR x 2 min & 1 shock.   Upon arrival, BPs in ~90s, SaO2 ~90%.    Taken directly to cath lab for Anterior STEMI.   Marykay Lex, M.D., M.S. [facility] [address] [place], [place]  [address]  [phone] Pager # [phone] 02/10/2013 12:34 PM

## 2013-02-10 NOTE — CV Procedure (Signed)
CARDIAC CATHETERIZATION AND PERCUTANEOUS CORONARY INTERVENTION REPORT  NAME:  [name]   MRN: [id number] DOB:  December 31, 1931   ADMIT DATE: 02/10/2013 Procedure Date: 02/10/2013  INTERVENTIONAL CARDIOLOGIST: Marykay Lex, M.D., MS PRIMARY CARE PROVIDER: Darnelle Bos, MD PRIMARY CARDIOLOGIST: N/a  PATIENT:  [name] is a 79 y.o. male with a history of hypertension, hyperlipidemia, diabetes with a known roughly 4 cm AAA, who is otherwise very active. He was in his usual state of health until yesterday when he began noted significant substernal chest discomfort. He had at least 2 episodes yesterday spontaneously resolved. He was able to get to sleep without much difficulty last night however, this morning (02/10/2013) he awoke normally and went with a friend to have breakfast at church prior to doing some yard work. While at breakfast he again had an episode of this discomfort. His friend who is a cardiac patient was concerned and took him back home. Wants a ride home the significance of the pain was dramatically and he was then willing to call EMS. He was taken to the med Center direct and arrived they are somewhere around 9 to 9:30 AM. An ECG showed diffuse inferolateral ST depressions. He continued to have chest discomfort despite having been given sublingual nitroglycerin and morphine. In fact his blood pressures dropped into the 60s systolic after the second nitroglycerin. At that time I was contacted by the ER physician, who felt that this was an acute coronary syndrome. By my request at roughly 1008 hours this morning, another ACT was checked which revealed significant 3-4 mm elevations in V2 with at least 1-2 mm elevations in the remainder the precordial leads. Code STEMI was called, and the patient was brought emergently to the emergency room directly to the cardiac catheterization lab. En route to the emergency room, the patient suffered ventricular fibrillation after recurrence  of severe pain. The EMS crew performed CPR for roughly 2 minutes with one shock restoring a perfusing rhythm. His initial blood pressure after CPR was in the 140s, however upon arrival to the Cath Lab his blood pressures were in the 90s systolic as was his oxygen saturations in the 80-90% range. Given the clear etiology of ACS/anterior STEMI, the patient was prepped and draped for emergent cardiac catheterization. He arrived to the catheterization of a roughly 1100 hours Do to his poor oxygenation level, anesthesiology was on standby for possible intubation. Amiodarone bolus was administered while the patient was being prepped, and the drip continued.  PRE-OPERATIVE DIAGNOSIS:    Anterior STEMI  Status/Post Ventricular Fibrillation  Known peripheral vascular disease with AAA  PROCEDURES PERFORMED:    LEFT HEART CATHETERIZATION WITH CORONARY ANGIOGRAPHY  PERCUTANEOUS CORONARY INTERVENTION OF PROXIMAL-MID LAD 95-99% AND 80% TANDEM LESIONS - PROMUS PREMIER DES 2.75 MM X 24 MM (2.9 MM)  PROCEDURE:Consent:  Risks of procedure as well as the alternatives and risks of each were explained to the patient.  Verbal consent for procedure obtained.   Unable to obtain written consent because of emergent medical necessity.  PROCEDURE: The patient was brought to the 2nd Floor [facility] in the fasting state and prepped and draped in the usual sterile fashion for Right groin or radial access. A modified Allen's test with plethysmography was performed, revealing excellent Ulnar artery collateral flow.  Sterile technique was used including antiseptics, cap, gloves, gown, hand hygiene, mask and sheet.  Skin prep: Chlorhexidine.  Time Out: Verified patient identification, verified procedure, site/side was marked, verified correct  patient position, special equipment/implants available, medications/allergies/relevent history reviewed, required imaging and test results available.   Performed  Access: Right Radial Artery; 6 Fr Sheath -- Seldinger technique (Angiocath Micropuncture Kit)  IA Radial Cocktail given via sheath; IV Angiomax bolus & drip administered once cathteter reached the ascending aorta. Diagnostic:  5 Fr JR 4 advanced to the ascending aorta over a Versicore wire. 6 Fr XB 3.5 Guide, 5 Fr AR-1 and EZ RAD Right catheters exchanged over long exchange safety J-wire  Right Coronary Artery Angiography: JR 4  Left Coronary Artery Angiography: XB LAD 3.5 Guide  LV Hemodynamics (LV Gram): JR 4  MEDICATIONS:  Anesthesia:  Local Lidocaine 2 ml  Sedation:  2 mg IV Versed, 50 mcg IV fentanyl ; 0.4 mg of Rumazicon was administered after the patient became extremely lethargic and unable to protect his airway.  Omnipaque Contrast: 225 ml  Anticoagulation:  Angiomax Bolus & drip  Anti-Platelet Agent:  Brilinta 180 mg  Amiodarone 150 mg bolus followed by initial loading dose drip  Dopamine infusion initiated 20 mcg/Kg/min shortly after diagnostic images of RCA --> reduced to 10 mcg/Kg/min upon completion of PCI  During the end of the diagnostic part of the catheterization and initial in part of the PCI, the patient's oxygen saturation levels began to drop notably. The Cath Lab staff Performed Bag Valve Mask Ventilation to ensure adequate oxygenation. Anesthesia was present, but the patient was arousable after Rumazicon was administered. He was able to protect his own airway but was placed on a 100% nonrebreather. His dopamine dose was also adjusted based on heart rate and blood pressure.  Hemodynamics:  Central Aortic / Mean Pressures: Initial blood pressures 64/48  mmHg; closing blood pressures 96/40 mmHg  Left Ventricular Pressures / EDP: 96/12 mmHg; 28 mmHg  Left Ventriculography: 10 mL Hand-injection via JR 4  EF: Roughly 45 %  Wall Motion: Mild inferior hypokinesis  Coronary Anatomy:  Left Main: Large caliber vessel that is essentially the  proximal LAD. There is a 20% ostial stenosis. LAD: After long segment of the "Left Main," there is a proximal LAD 95-99% subtotal occlusion at the branch point of a small D1 and moderate caliber SP1. After this lesion there is a tubular 50% stenosis leading up to a major D2 that is not involving the lesion. Beyond D2 however there is a focal 80% hazy lesion. The remainder the LAD is free of any significant disease. There 2 smaller diagonal branches downstream as it wraps around the apex.  D1: Small-caliber vessel at the branch point with a severe stenosis is. There is no involvement lesion.  D2: Moderate to large caliber dominant diagonal branch that is not involved in the actual severe segment of the LAD lesion   RCA: Very large caliber, dominant vessel with 2 major RV marginal branches. It bifurcates distally into a large caliber Right Posterior Descending Artery (RPDA) and the moderate large-caliber Right Posterior AV Groove Branch (RPAV). No significant lesions noted  RPDA: Large-caliber vessel with 2 proximal branches. The vessel reaches almost to the apex. Angiographically normal.  RPL Sysytem:The RPAV began large-caliber vessel to moderate large-caliber posterior lateral system which is a major RPL with several smaller branches.  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  It was very difficult to selectively engage. Nonselective imaging revealed that bifurcates into a smaller almost ramus-like vessel then a lateral OM that gives rise to  2 branches. The vessel is moderate caliber as it courses around the aorta but tapers and a smaller moderate caliber branches. No significant lesions were noted.  Based on the localization on the ECG of ST elevations in the clear-cut LAD lesion as culprit, plans are made to proceed with intervention. As the patient was being ventilated using Bag-Valve-Mask, he was  not loaded with Brilinta until after the procedure was over. Was actually bifurcation lesion involving the smaller D1 which would not be salvageable if jailed, however the second 80% lesion in tandem did land very close distally to the ostium of D2. Therefore the decision was made to stent across D2 to cover both lesions with a single stent.  Percutaneous Coronary Intervention:  Culprit lesion, tandem 95-99% and 80% lesions ranging from proximal to mid LAD.   TIMI 3 flow pre-and post.  Both lesions reduced to 0%.  Guide: 6 Fr   XB LAD 3.5 Guidewire: BMW (LAD), Pro-Water (D2) Predilation Balloon: Sprinter Legend 2.5 mm x 15 mm;   6 Atm x 30 Sec x 2 inflations - 1 for each lesion  Stent: Promus Premier DES 2.75 mm x 24 mm;   Deployed at: 12 Atm x 30 Sec,   Postdilated with stent balloon: 16 Atm x 48 Sec  Final Diameter: 2.9 mm  Post deployment angiography in multiple views, with and without guidewire in place revealed excellent stent deployment and lesion coverage.  There was no evidence of dissection or perforation.  After completing the PCI, the 2 additional catheters noted above (AR 1 and EZ RAD Right) were used to attempt to engage the anomalous circumflex for additional angiographic images that didn't reveal the findings of the above.  The JR 4 was first used, was unsuccessful in this endeavor, however was used to cross the aortic valve for measurement of hemodynamics and performing a Hand-Injection Left Ventriculogram (power injection was not used due to the elevated EDP)  TR Band:  1225 Hours, 13 mL air  PATIENT DISPOSITION:    The patient was transferred to the PACU holding area in a relatively hemodynamicaly stable, chest pain free condition on 10 mcg/Kg/min of dopamine, amiodarone drip continued along with Angiomax at reduced rate.  He was still on 100% NRB mask with oxygen saturations of 92-96%.  The patient tolerated the procedure well, and there were no complications.   However, the patient did show signs of cardiogenic shock requiring pressor support. He was also hypoxic with significantly elevated EDP. Exam performed following the procedure revealed pulmonary edema (Killip Class III). The decision was made that he would not need intubation as with radial cath he would not have to lie flat for prolonged period time.  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  Standard post radial cath care for TR band removal.  Based on the elevated LVEDP, he will be administered low dose of boluses of IV Lasix  due to hypotension  Continue dopamine at current rate, would attempt to wean overnight, but would not completely wean below 3 mcg/Kg/min to allow for increased renal perfusion.  DAPT for a minimum of 1 year, however based on the location of this lesion, would continue lifelong. -- Case Management has been consulted for initiation of Brilinta  Would not reinitiate beta blocker or ARB/CCB until blood pressure improves off pressors  As he is currently in Killip 3 heart failure and mild cardiogenic shock, he will not be fast-track discharge. Anticipate earliest discharge being Monday, November 24.   Marykay Lex, M.D., M.S. [facility] [address] [place], [place]  [address]  [phone]  02/10/2013 12:53  PM

## 2013-02-10 NOTE — ED Notes (Signed)
REPEAT EKG PERFORMED AND ST ELEVATION NOTED PER DR BEATON.

## 2013-02-11 DIAGNOSIS — I519 Heart disease, unspecified: Secondary | ICD-10-CM

## 2013-02-11 DIAGNOSIS — I219 Acute myocardial infarction, unspecified: Secondary | ICD-10-CM

## 2013-02-11 DIAGNOSIS — Z9861 Coronary angioplasty status: Secondary | ICD-10-CM

## 2013-02-11 DIAGNOSIS — R570 Cardiogenic shock: Secondary | ICD-10-CM

## 2013-02-11 LAB — CBC
HCT: 45 % (ref 39.0–52.0)
Hemoglobin: 15.7 g/dL (ref 13.0–17.0)
MCHC: 34.9 g/dL (ref 30.0–36.0)
MCV: 91.6 fL (ref 78.0–100.0)
RBC: 4.91 MIL/uL (ref 4.22–5.81)
WBC: 19.9 10*3/uL — ABNORMAL HIGH (ref 4.0–10.5)

## 2013-02-11 LAB — BASIC METABOLIC PANEL
BUN: 25 mg/dL — ABNORMAL HIGH (ref 6–23)
Chloride: 108 mEq/L (ref 96–112)
Glucose, Bld: 160 mg/dL — ABNORMAL HIGH (ref 70–99)
Potassium: 4.3 mEq/L (ref 3.5–5.1)
Sodium: 139 mEq/L (ref 135–145)

## 2013-02-11 LAB — TROPONIN I: Troponin I: 18.22 ng/mL (ref <0.30)

## 2013-02-11 MED ORDER — PERFLUTREN LIPID MICROSPHERE
INTRAVENOUS | Status: AC
  Administered 2013-02-11: 18:00:00
  Filled 2013-02-11: qty 10

## 2013-02-11 MED ORDER — HYDROCODONE-ACETAMINOPHEN 5-325 MG PO TABS
1.0000 | ORAL_TABLET | Freq: Four times a day (QID) | ORAL | Status: DC | PRN
  Administered 2013-02-11 – 2013-02-15 (×6): 1 via ORAL
  Filled 2013-02-11 (×6): qty 1

## 2013-02-11 MED FILL — Sodium Chloride IV Soln 0.9%: INTRAVENOUS | Qty: 50 | Status: AC

## 2013-02-11 NOTE — Progress Notes (Signed)
Subjective: Feels much better. No further CP. Out of bed and in a chair. No right wrist pain.   Objective: Vital signs in last 24 hours: Temp:  [97.2 F (36.2 C)-98.2 F (36.8 C)] 97.8 F (36.6 C) (11/21 0400) Pulse Rate:  [50-96] 87 (11/21 0700) Resp:  [15-28] 23 (11/21 0700) BP: (62-135)/(36-88) 111/49 mmHg (11/21 0700) SpO2:  [69 %-100 %] 98 % (11/21 0700) FiO2 (%):  [30 %-50 %] 30 % (11/21 0400) Weight:  [192 lb (87.091 kg)] 192 lb (87.091 kg) (11/20 1013)    Intake/Output from previous day: 11/20 0701 - 11/21 0700 In: 965.1 [I.V.:965.1] Out: 1760 [Urine:1760] Intake/Output this shift:    Medications Current Facility-Administered Medications  Medication Dose Route Frequency Provider Last Rate Last Dose  . 0.9 %  sodium chloride infusion  1,000 mL Intravenous Continuous Elenora Gamma, MD 125 mL/hr at 02/10/13 1007 1,000 mL at 02/10/13 1007  . 0.9 %  sodium chloride infusion  250 mL Intravenous PRN Marykay Lex, MD      . acetaminophen (TYLENOL) tablet 650 mg  650 mg Oral Q4H PRN Marykay Lex, MD   650 mg at 02/11/13 0054  . allopurinol (ZYLOPRIM) tablet 100 mg  100 mg Oral Daily Marykay Lex, MD      . alum & mag hydroxide-simeth (MAALOX/MYLANTA) 200-200-20 MG/5ML suspension 30 mL  30 mL Oral Q4H PRN Lennette Bihari, MD   30 mL at 02/10/13 2209  . aspirin chewable tablet 81 mg  81 mg Oral Daily Marykay Lex, MD      . atorvastatin (LIPITOR) tablet 80 mg  80 mg Oral q1800 Marykay Lex, MD   80 mg at 02/10/13 1813  . bivalirudin (ANGIOMAX) 5 mg/mL in sodium chloride 0.9 % 50 mL infusion  0.25 mg/kg/hr Intravenous Continuous Marykay Lex, MD   0.253 mg/kg/hr at 02/10/13 1900  . DOPamine (INTROPIN) 800 mg in dextrose 5 % 250 mL infusion  2-20 mcg/kg/min Intravenous Titrated Marykay Lex, MD 19.6 mL/hr at 02/11/13 0306 12 mcg/kg/min at 02/11/13 0306  . fentaNYL (SUBLIMAZE) 10 mcg/mL in sodium chloride 0.9 % 250 mL infusion  50 mcg/hr Intravenous To Cath  Marykay Lex, MD      . furosemide (LASIX) injection 20 mg  20 mg Intravenous BID Marykay Lex, MD   20 mg at 02/10/13 1837  . midazolam (VERSED) 1 mg/mL in sodium chloride 0.9 % 50 mL infusion  2 mg/hr Intravenous To Cath Marykay Lex, MD      . ondansetron [facility]) injection 4 mg  4 mg Intravenous Q6H PRN Marykay Lex, MD      . sodium chloride 0.9 % injection 3 mL  3 mL Intravenous Q12H Marykay Lex, MD   3 mL at 02/10/13 2209  . sodium chloride 0.9 % injection 3 mL  3 mL Intravenous PRN Marykay Lex, MD      . Ticagrelor [facility]) tablet 90 mg  90 mg Oral BID Marykay Lex, MD   90 mg at 02/10/13 2205    PE: General appearance: alert, cooperative and no distress Lungs: clear to auscultation bilaterally Heart: regular rate and rhythm, S1, S2 normal, no murmur, click, rub or gallop Extremities: no LEE Pulses: 2+ and symmetric Skin: warm and dry] Neurologic: Grossly normal  Lab Results:   Recent Labs  02/10/13 0955 02/11/13 0527  WBC 9.7 19.9*  HGB 16.5 15.7  HCT 49.0 45.0  PLT 202  182   BMET  Recent Labs  02/10/13 0955 02/11/13 0527  NA 135 139  K 4.9 4.3  CL 100 108  CO2 22 20  GLUCOSE 191* 160*  BUN 23 25*  CREATININE 1.30 1.30  CALCIUM 10.3 8.7   Cardiac Panel (last 3 results)  Recent Labs  02/10/13 0955 02/10/13 1957 02/11/13 0527  TROPONINI <0.30 4.57* 18.22*    Studies/Results:  [facility] 02/10/13 POST-OPERATIVE DIAGNOSIS:  Severe Single-Vessel CAD involving tandem lesions in the proximal-mid LAD (with near subtotal occlusion at the proximal lesion)  Successful PCI covering both lesions with a single Promus Premier DES stent with excellent angiographic findings post deployment  Mildly diminished LVEF with severely elevated LVEDP consistent with acute systolic and diastolic heart failure secondary to MI  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 He had severe single-vessel CAD involving tandem lesions in the proximal-mid LAD (with near subtotal occlusion at the proximal lesion). Successful PCI covering both lesions with a single Promus Premier DES stent with excellent angiographic findings, post deployment. Mildly diminished LVEF with severely elevated LVEDP consistent with acute systolic and diastolic heart failure secondary to MI. He is on DAPT with ASA + Brilinta. No further CP. ST elevations resolved on EKG. HR stable. BP a bit soft, but stable w/ SBP in the low 100s. He is on low dose IV Lasix for HF. Will likely need a low dose BB and ACE/ARB once BP improves. Continue statin. ?2D echo. Keep in CCU today. Will need cardiac rehab, maybe later today or in the am. Md to follow.     LOS: 1 day    Brittainy M. Delmer Islam 02/11/2013 8:58 AM

## 2013-02-11 NOTE — Progress Notes (Signed)
See my note also. Remains in pulmonary edema and shock. Keep in ICU. Give lasix after he lays back in bed.

## 2013-02-11 NOTE — Progress Notes (Signed)
Subjective:  He is smiling and says he feels well, but remains hypotensive, pressor dependent and hypoxic without O2 supplementation. Chest discomfort when coughing only. Unable to wean down dopamine. Lungs have bilateral rales. Good urine output.  Objective:  Temp:  [97.2 F (36.2 C)-98.2 F (36.8 C)] 97.8 F (36.6 C) (11/21 0400) Pulse Rate:  [50-96] 87 (11/21 0700) Resp:  [15-28] 23 (11/21 0700) BP: (62-135)/(36-88) 111/49 mmHg (11/21 0700) SpO2:  [69 %-100 %] 98 % (11/21 0700) FiO2 (%):  [30 %-50 %] 30 % (11/21 0400) Weight:  [192 lb (87.091 kg)] 192 lb (87.091 kg) (11/20 1013) Weight change:   Intake/Output from previous day: 11/20 0701 - 11/21 0700 In: 965.1 [I.V.:965.1] Out: 1760 [Urine:1760]  Intake/Output from this shift:    Medications: Current Facility-Administered Medications  Medication Dose Route Frequency Provider Last Rate Last Dose  . 0.9 %  sodium chloride infusion  1,000 mL Intravenous Continuous Elenora Gamma, MD 125 mL/hr at 02/10/13 1007 1,000 mL at 02/10/13 1007  . 0.9 %  sodium chloride infusion  250 mL Intravenous PRN Marykay Lex, MD      . acetaminophen (TYLENOL) tablet 650 mg  650 mg Oral Q4H PRN Marykay Lex, MD   650 mg at 02/11/13 0054  . allopurinol (ZYLOPRIM) tablet 100 mg  100 mg Oral Daily Marykay Lex, MD      . alum & mag hydroxide-simeth (MAALOX/MYLANTA) 200-200-20 MG/5ML suspension 30 mL  30 mL Oral Q4H PRN Lennette Bihari, MD   30 mL at 02/10/13 2209  . aspirin chewable tablet 81 mg  81 mg Oral Daily Marykay Lex, MD      . atorvastatin (LIPITOR) tablet 80 mg  80 mg Oral q1800 Marykay Lex, MD   80 mg at 02/10/13 1813  . bivalirudin (ANGIOMAX) 5 mg/mL in sodium chloride 0.9 % 50 mL infusion  0.25 mg/kg/hr Intravenous Continuous Marykay Lex, MD   0.253 mg/kg/hr at 02/10/13 1900  . DOPamine (INTROPIN) 800 mg in dextrose 5 % 250 mL infusion  2-20 mcg/kg/min Intravenous Titrated Marykay Lex, MD 19.6 mL/hr at  02/11/13 0306 12 mcg/kg/min at 02/11/13 0306  . fentaNYL (SUBLIMAZE) 10 mcg/mL in sodium chloride 0.9 % 250 mL infusion  50 mcg/hr Intravenous To Cath Marykay Lex, MD      . furosemide (LASIX) injection 20 mg  20 mg Intravenous BID Marykay Lex, MD   20 mg at 02/10/13 1837  . midazolam (VERSED) 1 mg/mL in sodium chloride 0.9 % 50 mL infusion  2 mg/hr Intravenous To Cath Marykay Lex, MD      . ondansetron [facility]) injection 4 mg  4 mg Intravenous Q6H PRN Marykay Lex, MD      . sodium chloride 0.9 % injection 3 mL  3 mL Intravenous Q12H Marykay Lex, MD   3 mL at 02/10/13 2209  . sodium chloride 0.9 % injection 3 mL  3 mL Intravenous PRN Marykay Lex, MD      . Ticagrelor [facility]) tablet 90 mg  90 mg Oral BID Marykay Lex, MD   90 mg at 02/10/13 2205    Physical Exam: General appearance: alert, cooperative and no distress Neck: no adenopathy, no carotid bruit, no JVD, supple, symmetrical, trachea midline and thyroid not enlarged, symmetric, no tenderness/mass/nodules Lungs: rales bilaterally Heart: regular rate and rhythm, S1, S2 normal and S3 present Abdomen: soft, non-tender; bowel sounds normal; no masses,  no  organomegaly Extremities: extremities normal, atraumatic, no cyanosis or edema Pulses: 2+ and symmetric Skin: Skin color, texture, turgor normal. No rashes or lesions Neurologic: Grossly normal  Lab Results: Results for orders placed during the hospital encounter of 02/10/13 (from the past 48 hour(s))  PRO B NATRIURETIC PEPTIDE     Status: None   Collection Time    02/10/13  9:55 AM      Result Value Range   Pro B Natriuretic peptide (BNP) 215.4  0 - 450 pg/mL  CBC     Status: None   Collection Time    02/10/13  9:55 AM      Result Value Range   WBC 9.7  4.0 - 10.5 K/uL   RBC 5.37  4.22 - 5.81 MIL/uL   Hemoglobin 16.5  13.0 - 17.0 g/dL   HCT 16.1  09.6 - 04.5 %   MCV 91.2  78.0 - 100.0 fL   MCH 30.7  26.0 - 34.0 pg   MCHC 33.7  30.0 - 36.0 g/dL     RDW 40.9  81.1 - 91.4 %   Platelets 202  150 - 400 K/uL  COMPREHENSIVE METABOLIC PANEL     Status: Abnormal   Collection Time    02/10/13  9:55 AM      Result Value Range   Sodium 135  135 - 145 mEq/L   Potassium 4.9  3.5 - 5.1 mEq/L   Chloride 100  96 - 112 mEq/L   CO2 22  19 - 32 mEq/L   Glucose, Bld 191 (*) 70 - 99 mg/dL   BUN 23  6 - 23 mg/dL   Creatinine, Ser 7.82  0.50 - 1.35 mg/dL   Calcium 95.6  8.4 - 21.3 mg/dL   Total Protein 7.7  6.0 - 8.3 g/dL   Albumin 4.2  3.5 - 5.2 g/dL   AST 36  0 - 37 U/L   Comment: SLIGHT HEMOLYSIS     HEMOLYSIS AT THIS LEVEL MAY AFFECT RESULT   ALT 30  0 - 53 U/L   Alkaline Phosphatase 65  39 - 117 U/L   Total Bilirubin 0.4  0.3 - 1.2 mg/dL   GFR calc non Af Amer 51 (*) >90 mL/min   GFR calc Af Amer 59 (*) >90 mL/min   Comment: (NOTE)     The eGFR has been calculated using the CKD EPI equation.     This calculation has not been validated in all clinical situations.     eGFR's persistently <90 mL/min signify possible Chronic Kidney     Disease.  TROPONIN I     Status: None   Collection Time    02/10/13  9:55 AM      Result Value Range   Troponin I <0.30  <0.30 ng/mL   Comment:            Due to the release kinetics of cTnI,     a negative result within the first hours     of the onset of symptoms does not rule out     myocardial infarction with certainty.     If myocardial infarction is still suspected,     repeat the test at appropriate intervals.  POCT ACTIVATED CLOTTING TIME     Status: None   Collection Time    02/10/13 11:34 AM      Result Value Range   Activated Clotting Time 595    POCT I-STAT 3, BLOOD GAS (G3+)  Status: Abnormal   Collection Time    02/10/13 11:56 AM      Result Value Range   pH, Arterial 7.265 (*) 7.350 - 7.450   pCO2 arterial 38.9  35.0 - 45.0 mmHg   pO2, Arterial 109.0 (*) 80.0 - 100.0 mmHg   Bicarbonate 17.6 (*) 20.0 - 24.0 mEq/L   TCO2 19  0 - 100 mmol/L   O2 Saturation 97.0     Acid-base  deficit 9.0 (*) 0.0 - 2.0 mmol/L   Sample type ARTERIAL    MRSA PCR SCREENING     Status: None   Collection Time    02/10/13  1:32 PM      Result Value Range   MRSA by PCR NEGATIVE  NEGATIVE   Comment:            The GeneXpert MRSA Assay (FDA     approved for NASAL specimens     only), is one component of a     comprehensive MRSA colonization     surveillance program. It is not     intended to diagnose MRSA     infection nor to guide or     monitor treatment for     MRSA infections.  TROPONIN I     Status: Abnormal   Collection Time    02/10/13  7:57 PM      Result Value Range   Troponin I 4.57 (*) <0.30 ng/mL   Comment:            Due to the release kinetics of cTnI,     a negative result within the first hours     of the onset of symptoms does not rule out     myocardial infarction with certainty.     If myocardial infarction is still suspected,     repeat the test at appropriate intervals.     REPEATED TO VERIFY     CRITICAL RESULT CALLED TO, READ BACK BY AND VERIFIED WITH:     Roberto Scales [id number] [facility]  CBC     Status: Abnormal   Collection Time    02/11/13  5:27 AM      Result Value Range   WBC 19.9 (*) 4.0 - 10.5 K/uL   RBC 4.91  4.22 - 5.81 MIL/uL   Hemoglobin 15.7  13.0 - 17.0 g/dL   HCT 11.9  14.7 - 82.9 %   MCV 91.6  78.0 - 100.0 fL   MCH 32.0  26.0 - 34.0 pg   MCHC 34.9  30.0 - 36.0 g/dL   RDW 56.2  13.0 - 86.5 %   Platelets 182  150 - 400 K/uL  BASIC METABOLIC PANEL     Status: Abnormal   Collection Time    02/11/13  5:27 AM      Result Value Range   Sodium 139  135 - 145 mEq/L   Potassium 4.3  3.5 - 5.1 mEq/L   Chloride 108  96 - 112 mEq/L   CO2 20  19 - 32 mEq/L   Glucose, Bld 160 (*) 70 - 99 mg/dL   BUN 25 (*) 6 - 23 mg/dL   Creatinine, Ser 7.84  0.50 - 1.35 mg/dL   Calcium 8.7  8.4 - 69.6 mg/dL   GFR calc non Af Amer 51 (*) >90 mL/min   GFR calc Af Amer 59 (*) >90 mL/min   Comment: (NOTE)     The eGFR has been calculated using the CKD  EPI equation.     This calculation has not been validated in all clinical situations.     eGFR's persistently <90 mL/min signify possible Chronic Kidney     Disease.  TROPONIN I     Status: Abnormal   Collection Time    02/11/13  5:27 AM      Result Value Range   Troponin I 18.22 (*) <0.30 ng/mL   Comment:            Due to the release kinetics of cTnI,     a negative result within the first hours     of the onset of symptoms does not rule out     myocardial infarction with certainty.     If myocardial infarction is still suspected,     repeat the test at appropriate intervals.     CRITICAL VALUE NOTED.  VALUE IS CONSISTENT WITH PREVIOUSLY REPORTED AND CALLED VALUE.     REPEATED TO VERIFY    Imaging: Imaging results have been reviewed and Dg Chest Portable 1 View  02/10/2013   CLINICAL DATA:  Chest pain.  EXAM: PORTABLE CHEST - 1 VIEW  COMPARISON:  None.  FINDINGS: Mediastinum and hilar structures are normal. Borderline cardiomegaly with pulmonary vascular prominence and interstitial prominence with Kerley B lines. Tiny left pleural effusion cannot be excluded. These findings consistent with congestive heart failure with pulmonary interstitial edema. No pneumothorax. No acute osseous abnormality.  IMPRESSION: Congestive heart failure with pulmonary interstitial edema.   Electronically Signed   By: Maisie Fus  Register   On: 02/10/2013 10:08    Assessment:  1. Principal Problem: 2.   ST elevation myocardial infarction (STEMI) of anterior wall, initial episode of care 3. Active Problems: 4.   HTN (hypertension) 5.   HLD (hyperlipidemia) 6.   AAA (abdominal aortic aneurysm) 7.   CAD S/P percutaneous coronary angioplasty - Prox-Mid LAD; Promus DES 2.75 mm x 24 mm (2.9 mm) 8.   Presence of drug coated stent in LAD coronary artery 9.   Cardiogenic shock 10.   Ventricular fibrillation - occurred en route to [facility]; CPR & Defib by EMS 11. Pulmonary edema  Plan:  1. Remains critically ill,  Killip III, pressor dependent 2. Troponin release is relatively low, suggesting that he has a large amount of stunning and can expect improvement after a couple of days. Meanwhile he is very vulnerable to therisk of worsening hypotension, heart failure and malignant arrhythmia. Keep in ICU. Unable to give ACEi or beta blockers. Check echo. He still needs diuretics.  Time Spent Directly with Patient:  35 minutes  Length of Stay:  LOS: 1 day    Jaxzen Vanhorn 02/11/2013, 9:07 AM

## 2013-02-11 NOTE — Progress Notes (Addendum)
  Echocardiogram 2D Echocardiogram with Definity has been performed.  Lucynda Rosano FRANCES 02/11/2013, 6:35 PM

## 2013-02-12 DIAGNOSIS — I4901 Ventricular fibrillation: Secondary | ICD-10-CM

## 2013-02-12 LAB — PRO B NATRIURETIC PEPTIDE: Pro B Natriuretic peptide (BNP): 9429 pg/mL — ABNORMAL HIGH (ref 0–450)

## 2013-02-12 LAB — CBC
MCH: 30.3 pg (ref 26.0–34.0)
MCHC: 33.6 g/dL (ref 30.0–36.0)
MCV: 90.4 fL (ref 78.0–100.0)
Platelets: 163 10*3/uL (ref 150–400)
RBC: 4.58 MIL/uL (ref 4.22–5.81)

## 2013-02-12 LAB — BASIC METABOLIC PANEL
Calcium: 9.2 mg/dL (ref 8.4–10.5)
Creatinine, Ser: 1.07 mg/dL (ref 0.50–1.35)
GFR calc Af Amer: 74 mL/min — ABNORMAL LOW (ref >90)
GFR calc non Af Amer: 64 mL/min — ABNORMAL LOW (ref >90)
Glucose, Bld: 134 mg/dL — ABNORMAL HIGH (ref 70–99)
Potassium: 4.1 mEq/L (ref 3.5–5.1)
Sodium: 132 mEq/L — ABNORMAL LOW (ref 135–145)

## 2013-02-12 LAB — URINALYSIS, ROUTINE W REFLEX MICROSCOPIC
Bilirubin Urine: NEGATIVE
Hgb urine dipstick: NEGATIVE
Ketones, ur: NEGATIVE mg/dL
Leukocytes, UA: NEGATIVE
Nitrite: NEGATIVE
Protein, ur: NEGATIVE mg/dL
pH: 6 (ref 5.0–8.0)

## 2013-02-12 LAB — CK TOTAL AND CKMB (NOT AT ARMC)
CK, MB: 12.7 ng/mL (ref 0.3–4.0)
Total CK: 422 U/L — ABNORMAL HIGH (ref 7–232)

## 2013-02-12 LAB — TROPONIN I: Troponin I: 8.46 ng/mL (ref <0.30)

## 2013-02-12 NOTE — Progress Notes (Signed)
CRITICAL VALUE ALERT  Critical value received:  CKMB 12.7  Date of notification:  02/12/2013  Time of notification:  1310  Critical value read back:yes  Nurse who received alert:  Kimber Relic   MD notified (1st page): Nada Boozer, NP  Time of first page:  1310  MD notified (2nd page):  Time of second page:  Responding MD:  Nada Boozer  Time MD responded:  1311

## 2013-02-12 NOTE — Progress Notes (Signed)
Subjective:   Objective: Vital signs in last 24 hours: Temp:  [98.1 F (36.7 C)-98.9 F (37.2 C)] 98.9 F (37.2 C) (11/22 0800) Pulse Rate:  [82-110] 83 (11/22 0800) Resp:  [19-27] 22 (11/22 0800) BP: (81-134)/(33-92) 97/34 mmHg (11/22 0800) SpO2:  [95 %-100 %] 99 % (11/22 0800) Weight:  [190 lb 14.7 oz (86.6 kg)] 190 lb 14.7 oz (86.6 kg) (11/22 0500) Weight change: -1 lb 1.3 oz (-0.491 kg) Last BM Date: 02/10/13 Intake/Output from previous day: -3040 (-3814 since admit) 11/21 0701 - 11/22 0700 In: 500.6 [I.V.:500.6] Out: 3520 [Urine:3520] Intake/Output this shift: Total I/O In: 402.8 [P.O.:360; I.V.:42.8] Out: -   PE: Per MD:  General:Pleasant affect, NAD Skin:Warm and dry, brisk capillary refill HEENT:normocephalic, sclera clear, mucus membranes moist Neck:supple, no JVD, no bruits  Heart:S1S2 RRR without murmur, gallup, rub or click Lungs:clear without rales, rhonchi, or wheezes RUE:AVWU, non tender, + BS, do not palpate liver spleen or masses Ext:no lower ext edema, 2+ pedal pulses, 2+ radial pulses Neuro:alert and oriented, MAE, follows commands, + facial symmetry   Lab Results:  Recent Labs  02/10/13 0955 02/11/13 0527  WBC 9.7 19.9*  HGB 16.5 15.7  HCT 49.0 45.0  PLT 202 182   BMET  Recent Labs  02/10/13 0955 02/11/13 0527  NA 135 139  K 4.9 4.3  CL 100 108  CO2 22 20  GLUCOSE 191* 160*  BUN 23 25*  CREATININE 1.30 1.30  CALCIUM 10.3 8.7    Recent Labs  02/10/13 1957 02/11/13 0527  TROPONINI 4.57* 18.22*    No results found for this basename: CHOL,  HDL,  LDLCALC,  LDLDIRECT,  TRIG,  CHOLHDL   No results found for this basename: HGBA1C     No results found for this basename: TSH    Hepatic Function Panel  Recent Labs  02/10/13 0955  PROT 7.7  ALBUMIN 4.2  AST 36  ALT 30  ALKPHOS 65  BILITOT 0.4     Studies/Results: Dg Chest Portable 1 View  02/10/2013   CLINICAL DATA:  Chest pain.  EXAM: PORTABLE CHEST  - 1 VIEW  COMPARISON:  None.  FINDINGS: Mediastinum and hilar structures are normal. Borderline cardiomegaly with pulmonary vascular prominence and interstitial prominence with Kerley B lines. Tiny left pleural effusion cannot be excluded. These findings consistent with congestive heart failure with pulmonary interstitial edema. No pneumothorax. No acute osseous abnormality.  IMPRESSION: Congestive heart failure with pulmonary interstitial edema.   Electronically Signed   By: Maisie Fus  Register   On: 02/10/2013 10:08    Medications: I have reviewed the patient's current medications. Scheduled Meds: . allopurinol  100 mg Oral Daily  . aspirin  81 mg Oral Daily  . atorvastatin  80 mg Oral q1800  . furosemide  20 mg Intravenous BID  . sodium chloride  3 mL Intravenous Q12H  . Ticagrelor  90 mg Oral BID   Continuous Infusions: . DOPamine 7 mcg/kg/min (02/12/13 0530)   PRN Meds:.sodium chloride, alum & mag hydroxide-simeth, HYDROcodone-acetaminophen, ondansetron (ZOFRAN) IV, sodium chloride  Assessment/Plan: Principal Problem:   ST elevation myocardial infarction (STEMI) of anterior wall, initial episode of care Active Problems:   CAD S/P percutaneous coronary angioplasty - Prox-Mid LAD; Promus DES 2.75 mm x 24 mm (2.9 mm)   Ventricular fibrillation - occurred en route to [facility]; CPR & Defib by EMS   HTN (hypertension)   HLD (hyperlipidemia)   AAA (abdominal aortic aneurysm)  Cardiogenic shock  PLAN:on IV dopamine, slowly weaning. Check labs with elevated WBC yesterday and check UA.  Will recheck CXR in AM.  On Low dose Lasix 20 mg IV BID.    LOS: 2 days   Time spent with pt. :20 minutes. [facility] R  Nurse Practitioner Certified Pager [phone] 02/12/2013, 9:41 AM   Agree with note written by Nada Boozer RNP  S/P Ant STEMI, VF arrest with defib and CPR, emergent PCI/Stent prox LAD done radially by Dr. Herbie Baltimore. He had CGS with pulm edema. Diuresed. Lunds clear. Cor RRR. BP better.  On Amio and IV dop (weaning) and BP better. Denies CP/SOB. BNP was 16K, will re check. WBCs increased  prob related to MI. Will follow. Re check CXR and BNP. AFEB. CRH once off pressors. Start BB and ACE-I once BP stablizes off pressors. 2D pending (not read yet).  Runell Gess 02/12/2013 9:59 AM

## 2013-02-13 ENCOUNTER — Other Ambulatory Visit: Payer: Self-pay

## 2013-02-13 ENCOUNTER — Inpatient Hospital Stay (HOSPITAL_COMMUNITY): Payer: Medicare Other

## 2013-02-13 ENCOUNTER — Encounter (HOSPITAL_COMMUNITY): Payer: Self-pay | Admitting: Cardiology

## 2013-02-13 DIAGNOSIS — I480 Paroxysmal atrial fibrillation: Secondary | ICD-10-CM

## 2013-02-13 DIAGNOSIS — I2510 Atherosclerotic heart disease of native coronary artery without angina pectoris: Secondary | ICD-10-CM

## 2013-02-13 DIAGNOSIS — Z8679 Personal history of other diseases of the circulatory system: Secondary | ICD-10-CM | POA: Diagnosis not present

## 2013-02-13 DIAGNOSIS — I4819 Other persistent atrial fibrillation: Secondary | ICD-10-CM | POA: Diagnosis not present

## 2013-02-13 DIAGNOSIS — I25119 Atherosclerotic heart disease of native coronary artery with unspecified angina pectoris: Secondary | ICD-10-CM | POA: Insufficient documentation

## 2013-02-13 DIAGNOSIS — I4891 Unspecified atrial fibrillation: Secondary | ICD-10-CM

## 2013-02-13 DIAGNOSIS — I519 Heart disease, unspecified: Secondary | ICD-10-CM | POA: Diagnosis not present

## 2013-02-13 HISTORY — DX: Paroxysmal atrial fibrillation: I48.0

## 2013-02-13 HISTORY — DX: Atherosclerotic heart disease of native coronary artery without angina pectoris: I25.10

## 2013-02-13 LAB — MAGNESIUM: Magnesium: 2.7 mg/dL — ABNORMAL HIGH (ref 1.5–2.5)

## 2013-02-13 LAB — BASIC METABOLIC PANEL
BUN: 22 mg/dL (ref 6–23)
BUN: 23 mg/dL (ref 6–23)
CO2: 21 mEq/L (ref 19–32)
Calcium: 8.5 mg/dL (ref 8.4–10.5)
Chloride: 98 mEq/L (ref 96–112)
Creatinine, Ser: 1 mg/dL (ref 0.50–1.35)
Creatinine, Ser: 1.01 mg/dL (ref 0.50–1.35)
GFR calc Af Amer: 79 mL/min — ABNORMAL LOW (ref >90)
GFR calc non Af Amer: 69 mL/min — ABNORMAL LOW (ref >90)
GFR calc non Af Amer: 69 mL/min — ABNORMAL LOW (ref >90)
Glucose, Bld: 114 mg/dL — ABNORMAL HIGH (ref 70–99)
Glucose, Bld: 141 mg/dL — ABNORMAL HIGH (ref 70–99)
Potassium: 3.3 mEq/L — ABNORMAL LOW (ref 3.5–5.1)
Potassium: 3.8 mEq/L (ref 3.5–5.1)
Sodium: 130 mEq/L — ABNORMAL LOW (ref 135–145)
Sodium: 135 mEq/L (ref 135–145)

## 2013-02-13 LAB — CBC
HCT: 41 % (ref 39.0–52.0)
Hemoglobin: 14.5 g/dL (ref 13.0–17.0)
MCH: 31.9 pg (ref 26.0–34.0)
MCHC: 35.4 g/dL (ref 30.0–36.0)
MCV: 90.3 fL (ref 78.0–100.0)
Platelets: 127 10*3/uL — ABNORMAL LOW (ref 150–400)
RBC: 4.54 MIL/uL (ref 4.22–5.81)
WBC: 16.6 10*3/uL — ABNORMAL HIGH (ref 4.0–10.5)

## 2013-02-13 LAB — TROPONIN I: Troponin I: 3.2 ng/mL (ref <0.30)

## 2013-02-13 LAB — HEPARIN LEVEL (UNFRACTIONATED): Heparin Unfractionated: 0.21 IU/mL — ABNORMAL LOW (ref 0.30–0.70)

## 2013-02-13 MED ORDER — AMIODARONE HCL IN DEXTROSE 360-4.14 MG/200ML-% IV SOLN
30.0000 mg/h | INTRAVENOUS | Status: DC
  Administered 2013-02-13 – 2013-02-14 (×2): 30 mg/h via INTRAVENOUS
  Filled 2013-02-13 (×3): qty 200

## 2013-02-13 MED ORDER — SODIUM CHLORIDE 0.9 % IV SOLN
INTRAVENOUS | Status: DC
  Administered 2013-02-13: 10 mL/h via INTRAVENOUS

## 2013-02-13 MED ORDER — HEPARIN BOLUS VIA INFUSION
4000.0000 [IU] | Freq: Once | INTRAVENOUS | Status: AC
  Administered 2013-02-13: 4000 [IU] via INTRAVENOUS
  Filled 2013-02-13: qty 4000

## 2013-02-13 MED ORDER — IBUTILIDE FUMARATE 1 MG/10ML IV SOLN
1.0000 mg | Freq: Once | INTRAVENOUS | Status: AC
  Administered 2013-02-13: 1 mg via INTRAVENOUS
  Filled 2013-02-13: qty 10

## 2013-02-13 MED ORDER — MAGNESIUM SULFATE 40 MG/ML IJ SOLN
2.0000 g | INTRAMUSCULAR | Status: AC
  Administered 2013-02-13: 2 g via INTRAVENOUS
  Filled 2013-02-13: qty 50

## 2013-02-13 MED ORDER — POTASSIUM CHLORIDE CRYS ER 20 MEQ PO TBCR
60.0000 meq | EXTENDED_RELEASE_TABLET | Freq: Once | ORAL | Status: AC
  Administered 2013-02-13: 60 meq via ORAL
  Filled 2013-02-13: qty 3

## 2013-02-13 MED ORDER — AMIODARONE HCL IN DEXTROSE 360-4.14 MG/200ML-% IV SOLN
60.0000 mg/h | INTRAVENOUS | Status: AC
  Administered 2013-02-13: 60 mg/h via INTRAVENOUS
  Filled 2013-02-13 (×2): qty 200

## 2013-02-13 MED ORDER — POTASSIUM CHLORIDE 10 MEQ/100ML IV SOLN
10.0000 meq | INTRAVENOUS | Status: AC
  Administered 2013-02-13 (×2): 10 meq via INTRAVENOUS
  Filled 2013-02-13 (×2): qty 100

## 2013-02-13 MED ORDER — POTASSIUM CHLORIDE CRYS ER 10 MEQ PO TBCR
EXTENDED_RELEASE_TABLET | ORAL | Status: AC
  Filled 2013-02-13: qty 6

## 2013-02-13 MED ORDER — HEPARIN (PORCINE) IN NACL 100-0.45 UNIT/ML-% IJ SOLN
2100.0000 [IU]/h | INTRAMUSCULAR | Status: DC
  Administered 2013-02-13: 1200 [IU]/h via INTRAVENOUS
  Administered 2013-02-13: 1400 [IU]/h via INTRAVENOUS
  Administered 2013-02-14: 1750 [IU]/h via INTRAVENOUS
  Administered 2013-02-15: 2100 [IU]/h via INTRAVENOUS
  Filled 2013-02-13 (×8): qty 250

## 2013-02-13 MED ORDER — IBUTILIDE FUMARATE 1 MG/10ML IV SOLN
1.0000 mg | Freq: Once | INTRAVENOUS | Status: DC
  Filled 2013-02-13: qty 10

## 2013-02-13 MED ORDER — MAGNESIUM SULFATE 40 MG/ML IJ SOLN
2.0000 g | Freq: Once | INTRAMUSCULAR | Status: AC
  Administered 2013-02-13: 2 g via INTRAVENOUS
  Filled 2013-02-13: qty 50

## 2013-02-13 NOTE — Progress Notes (Signed)
ANTICOAGULATION CONSULT NOTE - Initial Consult  Pharmacy Consult for heparin Indication: atrial fibrillation  Allergies  Allergen Reactions  . Ciprofloxacin Nausea Only    Patient Measurements: Height: 5\' 10"  (177.8 cm) Weight: 190 lb 14.7 oz (86.6 kg) IBW/kg (Calculated) : 73  Vital Signs: Temp: 98.3 F (36.8 C) (11/22 2346) Temp src: Other (Comment) (11/22 2000) BP: 91/42 mmHg (11/23 0100) Pulse Rate: 199 (11/23 0100)  Labs:  Recent Labs  02/10/13 0955 02/10/13 1957 02/11/13 0527 02/12/13 1122  HGB 16.5  --  15.7 13.9  HCT 49.0  --  45.0 41.4  PLT 202  --  182 163  CREATININE 1.30  --  1.30 1.07  CKTOTAL  --   --   --  422*  CKMB  --   --   --  12.7*  TROPONINI <0.30 4.57* 18.22* 8.46*    Estimated Creatinine Clearance: 57.8 ml/min (by C-G formula based on Cr of 1.07).   Medical History: Past Medical History  Diagnosis Date  . Hypertension   . Prostate disease   . Abdominal aortic aneurysm     Medications:  Prescriptions prior to admission  Medication Sig Dispense Refill  . allopurinol (ZYLOPRIM) 100 MG tablet Take 100 mg by mouth 2 (two) times daily.      Marland Kitchen aspirin 325 MG tablet Take 325 mg by mouth daily.      Marland Kitchen atenolol (TENORMIN) 50 MG tablet Take 50 mg by mouth daily.      Marland Kitchen CINNAMON PO Take 1,000 mg by mouth 2 (two) times daily.      . Coenzyme Q10 (Q-10 CO-ENZYME PO) Take 100 mg by mouth daily.      Marland Kitchen dutasteride (AVODART) 0.5 MG capsule Take 0.5 mg by mouth daily.      Marland Kitchen dutasteride (AVODART) 0.5 MG capsule Take 0.5 mg by mouth every 30 (thirty) days.      . Glucosamine-Chondroit-Vit C-Mn (GLUCOSAMINE 1500 COMPLEX PO) Take 1,500 mg by mouth 2 (two) times daily.      Marland Kitchen lisinopril (PRINIVIL,ZESTRIL) 10 MG tablet Take 10 mg by mouth daily.      . Multiple Vitamins-Minerals (ICAPS MV PO) Take 1 capsule by mouth 2 (two) times daily.      . primidone (MYSOLINE) 50 MG tablet Take 50 mg by mouth 2 (two) times daily.      . Red Yeast Rice 600 MG CAPS  Take 1,200 mg by mouth 2 (two) times daily.       Scheduled:  . allopurinol  100 mg Oral Daily  . aspirin  81 mg Oral Daily  . atorvastatin  80 mg Oral q1800  . furosemide  20 mg Intravenous BID  . ibutilide (CORVERT) IV bolus (>/= 60 kg) Cardioversion  1 mg Intravenous Once  . magnesium sulfate 1 - 4 g bolus IVPB  2 g Intravenous STAT  . sodium chloride  3 mL Intravenous Q12H  . Ticagrelor  90 mg Oral BID   Infusions:  . DOPamine Stopped (02/13/13 0048)    Assessment: 79yo male admitted 11/20 w/ STEMI and subsequent VF arrest now w/ Afib, to begin heparin.  Goal of Therapy:  Heparin level 0.3-0.7 units/ml Monitor platelets by anticoagulation protocol: Yes   Plan:  Will give heparin 4000 units IV bolus followed by gtt at 1200 units/hr and monitor heparin levels and CBC.  Vernard Gambles, PharmD, BCPS 02/13/2013,1:27 AM

## 2013-02-13 NOTE — Progress Notes (Signed)
ANTICOAGULATION CONSULT NOTE - Follow Up Consult  Pharmacy Consult for heparin Indication: atrial fibrillation  Allergies  Allergen Reactions  . Ciprofloxacin Nausea Only    Patient Measurements: Height: 5\' 10"  (177.8 cm) Weight: 190 lb 14.7 oz (86.6 kg) IBW/kg (Calculated) : 73 Heparin Dosing Weight:   Vital Signs: Temp: 98.4 F (36.9 C) (11/23 2030) Temp src: Oral (11/23 2030) BP: 98/48 mmHg (11/23 2030)  Labs:  Recent Labs  02/11/13 0527 02/12/13 1122 02/13/13 0200 02/13/13 0845 02/13/13 1045 02/13/13 1425 02/13/13 2038  HGB 15.7 13.9 14.5  --   --   --   --   HCT 45.0 41.4 41.0  --   --   --   --   PLT 182 163 127*  --   --   --   --   HEPARINUNFRC  --   --   --   --  0.13*  --  0.21*  CREATININE 1.30 1.07 1.00  --  1.01  --   --   CKTOTAL  --  422*  --   --   --   --   --   CKMB  --  12.7*  --   --   --   --   --   TROPONINI 18.22* 8.46*  --  3.25*  --  3.20*  --     Estimated Creatinine Clearance: 61.2 ml/min (by C-G formula based on Cr of 1.01).   Medications:  Scheduled:  . allopurinol  100 mg Oral Daily  . aspirin  81 mg Oral Daily  . atorvastatin  80 mg Oral q1800  . furosemide  20 mg Intravenous BID  . sodium chloride  3 mL Intravenous Q12H  . Ticagrelor  90 mg Oral BID   Infusions:  . sodium chloride 10 mL/hr (02/13/13 0902)  . amiodarone (NEXTERONE PREMIX) 360 mg/200 mL dextrose 30 mg/hr (02/13/13 1449)  . DOPamine Stopped (02/13/13 0048)  . heparin 1,400 Units/hr (02/13/13 1507)    Assessment: 79 yo male with afib is currently on subtherapeutic heparin.  Heparin level was 0.21. Goal of Therapy:  Heparin level 0.3-0.7 units/ml Monitor platelets by anticoagulation protocol: Yes   Plan:  1) Increase heparin drip to 1550 units/hr 2) Check an 8hr heparin level  Suvi Archuletta, Tsz-Yin 02/13/2013,9:32 PM

## 2013-02-13 NOTE — Significant Event (Addendum)
I was called by RN Alinda Money to evaluate the patient with new onset of atrial fibrillation. Around 1 AM.Patient developed A. fib with RVR with heart rates ranging from 110-150 bpm depending on patient's activity. At the same time blood pressure was around 85/55-90/60 mm Hg, saturation 100% on room air. Patient himself reported shortness of breath with minimal exertion.  Given new onset of atrial fibrillation in patient with significant diastolic dysfunction and recent STEMI lab was to proceed with Rhythm control strategy and with heparin anticoagulation. I also wanted to recheck electrolytes giving significant diuresis over the last day. And if electrolytes were within normal limits proceed with giving IV ibutilide 1 mg for chemical cardioversion. 2 g of magnesium IV was ordered in preparation for possible ibutilide infusion. My plan was to avoid amiodarone, diltiazem, or metoprolol given borderline hypertension  Unfortunately there was a miscommunication between the and the nurse at the bedside. Labs were ordered as back, ibutilide was also ordered to have it at the bedside ready. however there was clear verbal instruction not to give ibutilide until we ensure that potassium and magnesium are well above normal and I was also instructed the nurse to have me at the bedside for ibutilide infusion.   Around 3 AM I received a call from [facility] stating that: Ibutilide was given about 20 minutes ago, and now repeat EKG is showing "acute STEMI", electrolyte panel for unclear reason was still unavailable.  Further questioning patient reported mild chest pain which was somewhat similar to his chest pain at presentation, he continued to have shortness of breath minimal exertion. ECG was notable for worsening ST depression in inferolateral leads and ST elevation in V1 and V2. Given chest pain and shortness of breath as well as EKG changes we entertain the possibility of taking the patient back to the cardiac  catheterization lab. However on further questioning and reviewing old EKGs as well as patient's cath films the decision was made to postpone the. More so on further questioning patient stated that he might have confused does and he actually didn't have that much of chest pain.  Likely EKG changes were secondary to sodium blocking properties of ibutilide. Finally patient's electrolyte panel came back with a potassium of 3.3 and magnesium of 1.8.  After this potassium chloride 60 mEq by mouth and 20 mEq IV ordered as well as additional 2 g of magnesium IV.  At 6:30  a.m. patient remained in the A. fib with RVR with relatively well-controlled heart rates at rest around 105-110 beats per minute

## 2013-02-13 NOTE — Progress Notes (Signed)
Subjective: Pt developed a fib RVR 110-150 with drop in BP.  Ibutilide was given and pt developed chest pain and SOB, EKG abnormal thoughts were to return to cath lab but this was postponed. K+ & Mg+ replaced.    Objective: Vital signs in last 24 hours: Temp:  [97.3 F (36.3 C)-98.6 F (37 C)] 98.3 F (36.8 C) (11/22 2346) Pulse Rate:  [76-199] 110 (11/23 0300) Resp:  [18-29] 29 (11/23 0700) BP: (80-120)/(38-91) 95/54 mmHg (11/23 0700) SpO2:  [93 %-100 %] 100 % (11/23 0700) Weight change:  Last BM Date: 02/10/13 Intake/Output from previous day: -1393 (-5116 since admit)  11/22 0701 - 11/23 0700 In: 1597.5 [P.O.:880; I.V.:417.5; IV Piggyback:300] Out: 2900 [Urine:2900] Intake/Output this shift:    PE: General:Pleasant affect, NAD Skin:Warm and dry, brisk capillary refill HEENT:normocephalic, sclera clear, mucus membranes moist Neck:supple, no JVD, no bruits  Heart:irreg irreg without murmur, gallup, rub or click Lungs:clear without rales, rhonchi, or wheezes ZOX:WRUE, non tender, + BS, do not palpate liver spleen or masses Ext:no lower ext edema, 2+ pedal pulses, 2+ radial pulses Neuro:alert and oriented, MAE, follows commands, + facial symmetry   Lab Results:  Recent Labs  02/12/13 1122 02/13/13 0200  WBC 14.3* 16.6*  HGB 13.9 14.5  HCT 41.4 41.0  PLT 163 127*   BMET  Recent Labs  02/12/13 1122 02/13/13 0200  NA 132* 130*  K 4.1 3.3*  CL 99 98  CO2 25 21  GLUCOSE 134* 114*  BUN 24* 22  CREATININE 1.07 1.00  CALCIUM 9.2 9.0    Recent Labs  02/11/13 0527 02/12/13 1122  TROPONINI 18.22* 8.46*     Hepatic Function Panel  Recent Labs  02/10/13 0955  PROT 7.7  ALBUMIN 4.2  AST 36  ALT 30  ALKPHOS 65  BILITOT 0.4     Studies/Results: 2D ECHO: Procedure narrative: Transthoracic echocardiography. Image quality was adequate. The study was technically difficult. Intravenous contrast (Definity) was administered. - Left  ventricle: The cavity size was normal. Wall thickness was normal. Systolic function was mildly reduced. The estimated ejection fraction was in the range of 45% to 50%. Mild hypokinesis of the anteroseptal, anterior, and apical myocardium. Doppler parameters are consistent with abnormal left ventricular relaxation (grade 1 diastolic dysfunction). Doppler parameters are consistent with elevated mean left atrial filling pressure. Increased echogenicity at apex without definite evidence of thrombus. - Mitral valve: Mildly calcified annulus. - Atrial septum: No defect or patent foramen ovale was identified.   Medications: I have reviewed the patient's current medications. Scheduled Meds: . allopurinol  100 mg Oral Daily  . aspirin  81 mg Oral Daily  . atorvastatin  80 mg Oral q1800  . furosemide  20 mg Intravenous BID  . sodium chloride  3 mL Intravenous Q12H  . Ticagrelor  90 mg Oral BID   Continuous Infusions: . DOPamine Stopped (02/13/13 0048)  . heparin 1,200 Units/hr (02/13/13 0204)   PRN Meds:.sodium chloride, alum & mag hydroxide-simeth, HYDROcodone-acetaminophen, ondansetron (ZOFRAN) IV, sodium chloride  Assessment/Plan: Principal Problem:   ST elevation myocardial infarction (STEMI) of anterior wall, initial episode of care Active Problems:   CAD S/P percutaneous coronary angioplasty - Prox-Mid LAD; Promus DES 2.75 mm x 24 mm (2.9 mm)   Ventricular fibrillation - occurred en route to [facility]; CPR & Defib by EMS   HTN (hypertension)   HLD (hyperlipidemia)   AAA (abdominal aortic aneurysm)   Cardiogenic shock   Atrial fibrillation, 02/13/13  LV dysfunction, EF by echo 02/11/13 45-50%  PLAN: Remains in a. Fib with RVR, rate currently 110.  With abnormal EKG will check troponin this AM and recheck K+ and Mg+, on IV Heparin.  BP continues borderline.  WBC elevated, u/a clear, CXR P, afebrile  Pt with borderline BP this AM, resp. Elevated.   MD will eval. Concerning cardiac  cath.  CXR pending.    LOS: 3 days   Time spent with pt. :20 minutes. [facility] R  Nurse Practitioner Certified Pager [phone] 02/13/2013, 8:03 AM   Agree with note written by Nada Boozer RNP  POD #3 Ant STEMI Rx with DES to prox LAD with an excellent result. EF mildly depressed . He had VF on admission requiring CPR and defibrillation. He was on Amio until yesterday as well as dopamine. He also was in APE and was well diuresed. At around 1:am he went into Afib with RVR. He never had recurrent ischemic CP and his breathing this AM is about the same as yesterday. He received ibutilide IV by covering Fellow without success in conversion. His BP has remained the same despite the arrythmia. His K and Mg were mildly decreased and have since been repleted. Exam benign. Lungs clear. Will restart Amio w/o a bolus, check BMET and Mg. Keep in CCU for now. No BB or ACE-I for now until he becomes hemodynamically more stable.  Runell Gess 02/13/2013 8:40 AM

## 2013-02-13 NOTE — Progress Notes (Signed)
Pt converted from sinus rhythm to atrial fibrillation with rapid ventricular response, which was confirmed by ecg. Md who was outside the pt's room was informed and orders written for stat bmet, and magnesium which were drawn at 0200, and to infuse to 2gm of magnesium and heparin for anticoagulation. Md ordered for corvert(ibulitide) to be given, and not to bedside. A zoll pad was placed on patient for monitoring and defibrillation if indicated.After starting heparin and giving magnesium 2 grams, ibulitide was given per md orders. Patient started having a lot of ectopies, runs of non sustained ventricular tachycardia. Acute MI was seen on one ecg but patient's chest pain as described to me was soreness from cpr given to patient during the initial presentation. Subsequent ecgs did not show any acute changes.Patient was otherwise asymptomatic and said he felt find. Laboratory results showed a magnesium of 1.8 before the 2gm of magnesium was given and a potassium of 3.3. Orders were given to give potassium and by mouth and intravenous respectively and additional   2gm of magnesium.      Patient's rhythm is still in atrial fibrillation with rate of 100-120 with no ectopies. Will continue to monitor patient closely

## 2013-02-13 NOTE — Progress Notes (Signed)
Pt converted to SR, 12 lead EKG to confirm.

## 2013-02-13 NOTE — Progress Notes (Signed)
ANTICOAGULATION CONSULT NOTE - Follow Up  Pharmacy Consult for heparin Indication: atrial fibrillation  Allergies  Allergen Reactions  . Ciprofloxacin Nausea Only   Patient Measurements: Height: 5\' 10"  (177.8 cm) Weight: 190 lb 14.7 oz (86.6 kg) IBW/kg (Calculated) : 73  Vital Signs: Temp: 98 F (36.7 C) (11/23 0800) Temp src: Oral (11/23 0800) BP: 92/44 mmHg (11/23 0900) Pulse Rate: 110 (11/23 0300)  Labs:  Recent Labs  02/11/13 0527 02/12/13 1122 02/13/13 0200 02/13/13 0845 02/13/13 1045  HGB 15.7 13.9 14.5  --   --   HCT 45.0 41.4 41.0  --   --   PLT 182 163 127*  --   --   HEPARINUNFRC  --   --   --   --  0.13*  CREATININE 1.30 1.07 1.00  --  1.01  CKTOTAL  --  422*  --   --   --   CKMB  --  12.7*  --   --   --   TROPONINI 18.22* 8.46*  --  3.25*  --    Estimated Creatinine Clearance: 61.2 ml/min (by C-G formula based on Cr of 1.01).  Medical History: Past Medical History  Diagnosis Date  . Hypertension   . Prostate disease   . Abdominal aortic aneurysm   . Atrial fibrillation, 02/13/13 02/13/2013   Medications:  Prescriptions prior to admission  Medication Sig Dispense Refill  . allopurinol (ZYLOPRIM) 100 MG tablet Take 100 mg by mouth 2 (two) times daily.      Marland Kitchen aspirin 325 MG tablet Take 325 mg by mouth daily.      Marland Kitchen atenolol (TENORMIN) 50 MG tablet Take 50 mg by mouth daily.      Marland Kitchen CINNAMON PO Take 1,000 mg by mouth 2 (two) times daily.      . Coenzyme Q10 (Q-10 CO-ENZYME PO) Take 100 mg by mouth daily.      Marland Kitchen dutasteride (AVODART) 0.5 MG capsule Take 0.5 mg by mouth daily.      Marland Kitchen dutasteride (AVODART) 0.5 MG capsule Take 0.5 mg by mouth every 30 (thirty) days.      . Glucosamine-Chondroit-Vit C-Mn (GLUCOSAMINE 1500 COMPLEX PO) Take 1,500 mg by mouth 2 (two) times daily.      Marland Kitchen lisinopril (PRINIVIL,ZESTRIL) 10 MG tablet Take 10 mg by mouth daily.      . Multiple Vitamins-Minerals (ICAPS MV PO) Take 1 capsule by mouth 2 (two) times daily.      .  primidone (MYSOLINE) 50 MG tablet Take 50 mg by mouth 2 (two) times daily.      . Red Yeast Rice 600 MG CAPS Take 1,200 mg by mouth 2 (two) times daily.       Scheduled:  . allopurinol  100 mg Oral Daily  . aspirin  81 mg Oral Daily  . atorvastatin  80 mg Oral q1800  . furosemide  20 mg Intravenous BID  . sodium chloride  3 mL Intravenous Q12H  . Ticagrelor  90 mg Oral BID   Infusions:  . sodium chloride 10 mL/hr (02/13/13 0902)  . amiodarone (NEXTERONE PREMIX) 360 mg/200 mL dextrose 60 mg/hr (02/13/13 0903)  . amiodarone (NEXTERONE PREMIX) 360 mg/200 mL dextrose    . DOPamine Stopped (02/13/13 0048)  . heparin 1,200 Units/hr (02/13/13 1610)   Assessment: 79yo male admitted 11/20 w/ STEMI and subsequent VF arrest now w/ Afib, and was started on IV heparin.  His initial level this morning is sub-therapeutic at 0.13IU/ml on IV rate  of 1200 units/hr.  This may also be reflective of the 4000 unit bolus he received.  I spoke with his nurse who reports no noted bleeding nor IV complications.  His CBC is stable but he has had a drop in his platelets to 127K from 182K.  This may be due to both his IV heparin and his Ticagrelor.  Goal of Therapy:  Heparin level 0.3-0.7 units/ml Monitor platelets by anticoagulation protocol: Yes   Plan:  Will increase his IV heparin rate to 1400 units/hr Monitor heparin levels and CBC.  Nadara Mustard, PharmD., MS Clinical Pharmacist Pager:  [phone] Thank you for allowing pharmacy to be part of this patients care team. 02/13/2013,12:06 PM

## 2013-02-14 LAB — BASIC METABOLIC PANEL
BUN: 24 mg/dL — ABNORMAL HIGH (ref 6–23)
CO2: 18 mEq/L — ABNORMAL LOW (ref 19–32)
Chloride: 104 mEq/L (ref 96–112)
Creatinine, Ser: 1.12 mg/dL (ref 0.50–1.35)
GFR calc Af Amer: 70 mL/min — ABNORMAL LOW (ref >90)
Glucose, Bld: 134 mg/dL — ABNORMAL HIGH (ref 70–99)
Potassium: 3.4 mEq/L — ABNORMAL LOW (ref 3.5–5.1)

## 2013-02-14 LAB — HEPARIN LEVEL (UNFRACTIONATED): Heparin Unfractionated: 0.22 IU/mL — ABNORMAL LOW (ref 0.30–0.70)

## 2013-02-14 MED ORDER — POTASSIUM CHLORIDE CRYS ER 20 MEQ PO TBCR
40.0000 meq | EXTENDED_RELEASE_TABLET | Freq: Once | ORAL | Status: AC
  Administered 2013-02-14: 40 meq via ORAL
  Filled 2013-02-14: qty 2

## 2013-02-14 MED ORDER — FUROSEMIDE 40 MG PO TABS
40.0000 mg | ORAL_TABLET | Freq: Every day | ORAL | Status: DC
  Filled 2013-02-14: qty 1

## 2013-02-14 MED ORDER — AMIODARONE HCL IN DEXTROSE 360-4.14 MG/200ML-% IV SOLN
30.0000 mg/h | INTRAVENOUS | Status: DC
  Filled 2013-02-14: qty 200

## 2013-02-14 MED ORDER — FUROSEMIDE 40 MG PO TABS: 40.0000 mg | ORAL_TABLET | Freq: Every day | ORAL | Status: DC

## 2013-02-14 MED ORDER — POTASSIUM CHLORIDE CRYS ER 20 MEQ PO TBCR
40.0000 meq | EXTENDED_RELEASE_TABLET | Freq: Every day | ORAL | Status: DC
  Administered 2013-02-15: 40 meq via ORAL
  Filled 2013-02-14 (×2): qty 2

## 2013-02-14 MED ORDER — METOPROLOL TARTRATE 12.5 MG HALF TABLET
12.5000 mg | ORAL_TABLET | Freq: Two times a day (BID) | ORAL | Status: DC
  Administered 2013-02-14 – 2013-02-16 (×5): 12.5 mg via ORAL
  Filled 2013-02-14 (×6): qty 1

## 2013-02-14 MED ORDER — AMIODARONE HCL 200 MG PO TABS
400.0000 mg | ORAL_TABLET | Freq: Two times a day (BID) | ORAL | Status: DC
  Administered 2013-02-14 – 2013-02-16 (×5): 400 mg via ORAL
  Filled 2013-02-14 (×6): qty 2

## 2013-02-14 NOTE — Progress Notes (Signed)
ANTICOAGULATION CONSULT NOTE - Follow Up Consult  Pharmacy Consult for heparin Indication: atrial fibrillation  Labs:  Recent Labs  02/12/13 1122 02/13/13 0200 02/13/13 0845  02/13/13 1045 02/13/13 1425 02/13/13 2038 02/14/13 0255 02/14/13 1254  HGB 13.9 14.5  --   --   --   --   --   --   --   HCT 41.4 41.0  --   --   --   --   --   --   --   PLT 163 127*  --   --   --   --   --   --   --   HEPARINUNFRC  --   --   --   < > 0.13*  --  0.21* 0.23* 0.34  CREATININE 1.07 1.00  --   --  1.01  --   --  1.12  --   CKTOTAL 422*  --   --   --   --   --   --   --   --   CKMB 12.7*  --   --   --   --   --   --   --   --   TROPONINI 8.46*  --  3.25*  --   --  3.20*  --   --   --   < > = values in this interval not displayed.  Assessment: 79yo male was admitted for STEMI, taken to emergent cath, found LAD occlusion and DES was placed on 11/20.  On 11/23 pt had episode of a fib where pharmacy was consulted to dose heparin.  HL had been increased due to low levels.  Today's HL is therapeutic at 0.34, no signs of bleeds, and stable h/h with slight low plt.  Will continue current heparin dose.   Goal of Therapy:  Heparin level 0.3-0.7 units/ml   Plan:   Continue heparin 1750 units/hr and recheck level in 8hr to get two therapeutic HL Monitor h/h/plt and signs of bleed.  Anabel Bene, PharmD Clinical Pharmacist Resident Pager: [phone]   02/14/2013,1:40 PM

## 2013-02-14 NOTE — Progress Notes (Signed)
ANTICOAGULATION CONSULT NOTE - Follow Up Consult  Pharmacy Consult for heparin Indication: atrial fibrillation  Labs:  Recent Labs  02/12/13 1122 02/13/13 0200 02/13/13 0845 02/13/13 1045 02/13/13 1425  02/14/13 0255 02/14/13 1254 02/14/13 2015  HGB 13.9 14.5  --   --   --   --   --   --   --   HCT 41.4 41.0  --   --   --   --   --   --   --   PLT 163 127*  --   --   --   --   --   --   --   HEPARINUNFRC  --   --   --  0.13*  --   < > 0.23* 0.34 0.22*  CREATININE 1.07 1.00  --  1.01  --   --  1.12  --   --   CKTOTAL 422*  --   --   --   --   --   --   --   --   CKMB 12.7*  --   --   --   --   --   --   --   --   TROPONINI 8.46*  --  3.25*  --  3.20*  --   --   --   --   < > = values in this interval not displayed.  Assessment: 79yo male was admitted for STEMI, taken to emergent cath, found LAD occlusion and DES was placed on 11/20.  On 11/23 pt had episode of a fib and pharmacy was consulted to dose heparin.  Heparin drip 1750 uts/hr HL 0.22 < goal.   Goal of Therapy:  Heparin level 0.3-0.7 units/ml   Plan:   Increase heparin 1850 units/hr  Daily CBc, heparin level  Leota Sauers Pharm.D. CPP, BCPS Clinical Pharmacist [phone] 02/14/2013 8:53 PM

## 2013-02-14 NOTE — Progress Notes (Signed)
Subjective: Up in chair  Objective: Vital signs in last 24 hours: Temp:  [98 F (36.7 C)-98.6 F (37 C)] 98.6 F (37 C) (11/24 0700) Resp:  [17-32] 24 (11/24 0800) BP: (82-117)/(33-87) 111/65 mmHg (11/24 0800) SpO2:  [93 %-100 %] 100 % (11/24 0800) Weight change:  Last BM Date: 02/13/13 Intake/Output from previous day: +578 11/23 0701 - 11/24 0700 In: 2006 [P.O.:840; I.V.:1166] Out: 1525 [Urine:1525] Intake/Output this shift: Total I/O In: 44.2 [I.V.:44.2] Out: -   PE:  Per MD General:Pleasant affect, NAD Skin:Warm and dry, brisk capillary refill HEENT:normocephalic, sclera clear, mucus membranes moist Neck:supple, no JVD, no bruits  Heart:S1S2 RRR without murmur, gallup, rub or click Lungs:clear without rales, rhonchi, or wheezes UJW:JXBJ, non tender, + BS, do not palpate liver spleen or masses Ext:no lower ext edema, 2+ pedal pulses, 2+ radial pulses Neuro:alert and oriented, MAE, follows commands, + facial symmetry   Lab Results:  Recent Labs  02/12/13 1122 02/13/13 0200  WBC 14.3* 16.6*  HGB 13.9 14.5  HCT 41.4 41.0  PLT 163 127*   BMET  Recent Labs  02/13/13 1045 02/14/13 0255  NA 135 135  K 3.8 3.4*  CL 103 104  CO2 20 18*  GLUCOSE 141* 134*  BUN 23 24*  CREATININE 1.01 1.12  CALCIUM 8.5 8.3*    Recent Labs  02/13/13 0845 02/13/13 1425  TROPONINI 3.25* 3.20*      Studies/Results: Dg Chest Port 1 View  02/13/2013   CLINICAL DATA:  CHF.  EXAM: PORTABLE CHEST - 1 VIEW  COMPARISON:  02/10/2013.  FINDINGS: Low lung volumes. There is prominence of the interstitial markings. Mild peribronchial cuffing is identified. No focal reason consolidation appreciated. EKG leads appreciated about the chest. Areas likely representing transcutaneous cardiac pacing pads are identified within the left hemithorax. Visualized osseous structures unremarkable.  IMPRESSION: Prominence of the interstitial markings with decreased conspicuity when  compared to previous study. These findings likely reflect mild residual pulmonary edema.   Electronically Signed   By: Salome Holmes M.D.   On: 02/13/2013 08:35    Medications:  Scheduled Meds: . allopurinol  100 mg Oral Daily  . aspirin  81 mg Oral Daily  . atorvastatin  80 mg Oral q1800  . furosemide  20 mg Intravenous BID  . sodium chloride  3 mL Intravenous Q12H  . Ticagrelor  90 mg Oral BID   Continuous Infusions: . sodium chloride Stopped (02/13/13 2200)  . amiodarone (NEXTERONE PREMIX) 360 mg/200 mL dextrose 30 mg/hr (02/14/13 0135)  . DOPamine Stopped (02/13/13 0048)  . heparin 1,750 Units/hr (02/14/13 0759)   PRN Meds:.sodium chloride, alum & mag hydroxide-simeth, HYDROcodone-acetaminophen, ondansetron (ZOFRAN) IV, sodium chloride   Assessment/Plan: Principal Problem:   ST elevation myocardial infarction (STEMI) of anterior wall, initial episode of care Active Problems:   CAD S/P percutaneous coronary angioplasty - Prox-Mid LAD; Promus DES 2.75 mm x 24 mm (2.9 mm)   Ventricular fibrillation - occurred en route to [facility]; CPR & Defib by EMS   HTN (hypertension)   HLD (hyperlipidemia)   AAA (abdominal aortic aneurysm)   Cardiogenic shock   Atrial fibrillation, 02/13/13   LV dysfunction, EF by echo 02/11/13 45-50%   PLAN: Converted to SR last pm.  Begin rehab.  Chang amio to 400 BID, replace K+.  Pt with improved BP occ into the 90s.  HR 78 ? Add low dose BB.    LOS: 4 days   Time spent  with pt. : 20 minutes. [facility] R  Nurse Practitioner Certified Pager [phone] 02/14/2013, 8:38 AM   Agree with note written by Nada Boozer RNP  Doing much better. Converted to NSR from PAF on IV Amio. Feeling much better. No CP. Exam benign. Plan change Amio to PO load, cont IV hep until tomorrow and then stop. I don't think he'll need oral A/C unless he has recurrent PAF. On DAPT. Tx to TCU. [facility]. Hopefully home Wed.  Runell Gess 02/14/2013 8:51 AM

## 2013-02-14 NOTE — Progress Notes (Signed)
ANTICOAGULATION CONSULT NOTE - Follow Up Consult  Pharmacy Consult for heparin Indication: atrial fibrillation  Labs:  Recent Labs  02/11/13 0527 02/12/13 1122 02/13/13 0200 02/13/13 0845 02/13/13 1045 02/13/13 1425 02/13/13 2038 02/14/13 0255  HGB 15.7 13.9 14.5  --   --   --   --   --   HCT 45.0 41.4 41.0  --   --   --   --   --   PLT 182 163 127*  --   --   --   --   --   HEPARINUNFRC  --   --   --   --  0.13*  --  0.21* 0.23*  CREATININE 1.30 1.07 1.00  --  1.01  --   --  1.12  CKTOTAL  --  422*  --   --   --   --   --   --   CKMB  --  12.7*  --   --   --   --   --   --   TROPONINI 18.22* 8.46*  --  3.25*  --  3.20*  --   --     Assessment: 79yo male remains subtherapeutic on heparin after rate increases; lab was drawn early but very little movement in level since rate change.  Goal of Therapy:  Heparin level 0.3-0.7 units/ml   Plan:  Will increase heparin gtt by 2 units/kg/hr to 1750 units/hr and check level in 8hr.  Vernard Gambles, PharmD, BCPS  02/14/2013,5:23 AM

## 2013-02-14 NOTE — Progress Notes (Signed)
CARDIAC REHAB PHASE I   PRE:  Rate/Rhythm: 78SR    BP: sitting 108/62    SaO2:   MODE:  Ambulation: 250 ft   POST:  Rate/Rhythm: 92 SR    BP: sitting 130/63     SaO2:   Pt steady, thankful to walk. C/o SOB with walking about 150 ft. Standing rest, SaO2 94 RA. Return to recliner with VSS. Began ed  With family present. Pt very receptive. Will f/u. Encouraged more walking with staff/family today. [phone]  Elissa Lovett [place] CES, ACSM 02/14/2013 10:51 AM

## 2013-02-15 ENCOUNTER — Encounter (HOSPITAL_COMMUNITY): Payer: Self-pay | Admitting: Cardiology

## 2013-02-15 DIAGNOSIS — I5041 Acute combined systolic (congestive) and diastolic (congestive) heart failure: Secondary | ICD-10-CM

## 2013-02-15 LAB — BASIC METABOLIC PANEL
CO2: 18 mEq/L — ABNORMAL LOW (ref 19–32)
Calcium: 8.5 mg/dL (ref 8.4–10.5)
GFR calc Af Amer: 73 mL/min — ABNORMAL LOW (ref >90)
Glucose, Bld: 123 mg/dL — ABNORMAL HIGH (ref 70–99)
Potassium: 3.9 mEq/L (ref 3.5–5.1)
Sodium: 135 mEq/L (ref 135–145)

## 2013-02-15 LAB — HEPARIN LEVEL (UNFRACTIONATED): Heparin Unfractionated: 0.21 IU/mL — ABNORMAL LOW (ref 0.30–0.70)

## 2013-02-15 LAB — PRO B NATRIURETIC PEPTIDE: Pro B Natriuretic peptide (BNP): 4078 pg/mL — ABNORMAL HIGH (ref 0–450)

## 2013-02-15 MED ORDER — LISINOPRIL 2.5 MG PO TABS
2.5000 mg | ORAL_TABLET | Freq: Every day | ORAL | Status: DC
  Administered 2013-02-15: 2.5 mg via ORAL
  Filled 2013-02-15 (×2): qty 1

## 2013-02-15 MED ORDER — FUROSEMIDE 40 MG PO TABS
40.0000 mg | ORAL_TABLET | Freq: Two times a day (BID) | ORAL | Status: DC
  Administered 2013-02-15: 40 mg via ORAL
  Filled 2013-02-15 (×2): qty 1

## 2013-02-15 MED ORDER — FUROSEMIDE 40 MG PO TABS
40.0000 mg | ORAL_TABLET | Freq: Two times a day (BID) | ORAL | Status: DC
Start: 2013-02-16 — End: 2013-02-16
  Administered 2013-02-16: 40 mg via ORAL
  Filled 2013-02-15 (×3): qty 1

## 2013-02-15 MED ORDER — LOPERAMIDE HCL 2 MG PO CAPS
2.0000 mg | ORAL_CAPSULE | ORAL | Status: DC | PRN
  Administered 2013-02-15: 2 mg via ORAL
  Filled 2013-02-15: qty 1

## 2013-02-15 MED ORDER — FUROSEMIDE 40 MG PO TABS
40.0000 mg | ORAL_TABLET | Freq: Two times a day (BID) | ORAL | Status: DC
  Administered 2013-02-15: 40 mg via ORAL
  Filled 2013-02-15: qty 1

## 2013-02-15 NOTE — Progress Notes (Signed)
    Subjective:  Feels better, walked several times yesterday.  Still a "bit short-winded"  Objective:  Vital Signs in the last 24 hours: Temp:  [97.8 F (36.6 C)-98.6 F (37 C)] 98.6 F (37 C) (11/24 2135) Resp:  [16-24] 18 (11/25 0400) BP: (93-130)/(49-70) 114/53 mmHg (11/25 0400) SpO2:  [93 %-98 %] 93 % (11/24 1939)  Intake/Output from previous day: 11/24 0701 - 11/25 0700 In: 911.2 [P.O.:420; I.V.:491.2] Out: 250 [Urine:250] Intake/Output from this shift: Not accurately  recorded    Physical Exam: General appearance: alert, cooperative, appears stated age and no distress Neck: no adenopathy, no carotid bruit, no JVD and supple, symmetrical, trachea midline Lungs: clear to auscultation bilaterally, normal percussion bilaterally and few mild scattered bibasal rales Heart: regular rate and rhythm, S1, S2 normal, no murmur, click, rub or gallop and normal apical impulse Abdomen: soft, non-tender; bowel sounds normal; no masses,  no organomegaly Extremities: extremities normal, atraumatic, no cyanosis or edema Pulses: 2+ and symmetric Neurologic: Grossly normal  Lab Results:  Recent Labs  02/12/13 1122 02/13/13 0200  WBC 14.3* 16.6*  HGB 13.9 14.5  PLT 163 127*    Recent Labs  02/14/13 0255 02/15/13 0451  NA 135 135  K 3.4* 3.9  CL 104 104  CO2 18* 18*  GLUCOSE 134* 123*  BUN 24* 20  CREATININE 1.12 1.09    Recent Labs  02/13/13 0845 02/13/13 1425  TROPONINI 3.25* 3.20*   Imaging: Imaging results have been reviewed -- CXR: mild residual pulmonary edema  Cardiac Studies: Echo = EF  45-50%, expected Ant-Anteroseptal HK; Grd 1 diastolic  TELE: NSR 80, no further Afib  Assessment/Plan:  Principal Problem:   ST elevation myocardial infarction (STEMI) of anterior wall, initial episode of care Active Problems:   CAD S/P percutaneous coronary angioplasty - Prox-Mid LAD; Promus DES 2.75 mm x 24 mm (2.9 mm)   Cardiogenic shock - resolved 11/24  Ventricular fibrillation - occurred en route to [facility]; CPR & Defib by EMS   Atrial fibrillation, 02/13/13; peri-infarction   Acute combined systolic and diastolic heart failure   HTN (hypertension)   HLD (hyperlipidemia)   AAA (abdominal aortic aneurysm)   LV dysfunction, EF by echo 02/11/13 45-50%  CAD/STEMI: No further Angina or VF -- EF thankfully not too depressed, would expect good return of fxn.   Seems to be tolerating low dose BB  On statin, ASA & Brilinta  Acute on Chronic Combined HF (LVEF 45-50%); pro BNP remains @ ~4000  Still with residual pulmonary edema on CXR (should improve as BP improves) --   Continue with Lasix -- will need on d/c (BID today)  On low dose BB, but would like to try low dose ACE-I today for afterload reduction with reduced EF.  Afib / Vfib;  On Amiodarone PO load - will continue for at least 1 month post MI  If not recurrence of Afib, may not require long-term AC  D/c IV Heparin today  Monitor K+  Glycemic control has been relatively good, but elevated CBG - check HgbA1c (no prior Dx of DM to my knowledge)  Transfer to Tele today; [facility] - anticipate ? D/c by Wed.   LOS: 5 days    Geoffery Aultman W 02/15/2013, 8:08 AM

## 2013-02-15 NOTE — Progress Notes (Signed)
ANTICOAGULATION CONSULT NOTE - Follow Up Consult  Pharmacy Consult for heparin Indication: atrial fibrillation  Labs:  Recent Labs  02/12/13 1122 02/13/13 0200 02/13/13 0845 02/13/13 1045 02/13/13 1425  02/14/13 0255 02/14/13 1254 02/14/13 2015 02/15/13 0451  HGB 13.9 14.5  --   --   --   --   --   --   --   --   HCT 41.4 41.0  --   --   --   --   --   --   --   --   PLT 163 127*  --   --   --   --   --   --   --   --   HEPARINUNFRC  --   --   --  0.13*  --   < > 0.23* 0.34 0.22* 0.21*  CREATININE 1.07 1.00  --  1.01  --   --  1.12  --   --   --   CKTOTAL 422*  --   --   --   --   --   --   --   --   --   CKMB 12.7*  --   --   --   --   --   --   --   --   --   TROPONINI 8.46*  --  3.25*  --  3.20*  --   --   --   --   --   < > = values in this interval not displayed.   Assessment: 79yo male remains subtherapeutic on heparin with no movement in level despite rate increases.  Goal of Therapy:  Heparin level 0.3-0.7 units/ml   Plan:  Will increase heparin gtt by 3 units/kg/hr to 2100 units/hr and check level in 8hr.  Vernard Gambles, PharmD, BCPS  02/15/2013,5:56 AM

## 2013-02-15 NOTE — Progress Notes (Signed)
Patient stated that he was having loose bowel movements. He had about three in an hour. MD Notified and orders received. Patient given Immodium 2mg  po at 2157. Patient stated that he has not had anymore bowel movements at this point. Will continue to monitor.

## 2013-02-15 NOTE — Progress Notes (Signed)
CARDIAC REHAB PHASE I   PRE:  Rate/Rhythm: 70 SR  BP:  Supine:   Sitting: 101/52  Standing:    SaO2: 96 RA  MODE:  Ambulation: 350 ft   POST:  Rate/Rhythm: 90 SR  BP:  Supine:   Sitting: 125/56  Standing:    SaO2: 95 RA 1300-1320 Assisted X 1 to ambulate. Gait steady with hand held assist. Pt without c/o of cp or SOB with walking. VS stable Pt back to recliner after walk with call light in reach and family present. Answered some questions for pt and wife about heart healthy diet. We will follow pt in am.  Melina Copa RN 02/15/2013 1:15 PM

## 2013-02-16 ENCOUNTER — Encounter (HOSPITAL_COMMUNITY): Payer: Self-pay | Admitting: Cardiology

## 2013-02-16 LAB — BASIC METABOLIC PANEL
BUN: 24 mg/dL — ABNORMAL HIGH (ref 6–23)
Calcium: 8.7 mg/dL (ref 8.4–10.5)
Creatinine, Ser: 1.32 mg/dL (ref 0.50–1.35)
GFR calc Af Amer: 58 mL/min — ABNORMAL LOW (ref >90)
GFR calc non Af Amer: 50 mL/min — ABNORMAL LOW (ref >90)
Glucose, Bld: 118 mg/dL — ABNORMAL HIGH (ref 70–99)
Potassium: 4.6 mEq/L (ref 3.5–5.1)

## 2013-02-16 LAB — CBC
Hemoglobin: 15.2 g/dL (ref 13.0–17.0)
MCH: 31.9 pg (ref 26.0–34.0)
MCHC: 34.9 g/dL (ref 30.0–36.0)
MCV: 91.6 fL (ref 78.0–100.0)
Platelets: 225 10*3/uL (ref 150–400)
RDW: 14.5 % (ref 11.5–15.5)
WBC: 12.9 10*3/uL — ABNORMAL HIGH (ref 4.0–10.5)

## 2013-02-16 MED ORDER — TICAGRELOR 90 MG PO TABS: 90.0000 mg | ORAL_TABLET | Freq: Two times a day (BID) | ORAL | Status: DC

## 2013-02-16 MED ORDER — AMIODARONE HCL 400 MG PO TABS: ORAL_TABLET | ORAL | Status: DC

## 2013-02-16 MED ORDER — FUROSEMIDE 20 MG PO TABS: 20.0000 mg | ORAL_TABLET | Freq: Every day | ORAL | Status: DC

## 2013-02-16 MED ORDER — ATORVASTATIN CALCIUM 80 MG PO TABS: 80.0000 mg | ORAL_TABLET | Freq: Every day | ORAL | Status: DC

## 2013-02-16 MED ORDER — METOPROLOL TARTRATE 12.5 MG HALF TABLET: 12.5000 mg | ORAL_TABLET | Freq: Two times a day (BID) | ORAL | Status: DC

## 2013-02-16 MED ORDER — ASPIRIN 81 MG PO CHEW: 81.0000 mg | CHEWABLE_TABLET | Freq: Every day | ORAL | Status: DC

## 2013-02-16 MED ORDER — NITROGLYCERIN 0.4 MG SL SUBL: 0.4000 mg | SUBLINGUAL_TABLET | SUBLINGUAL | Status: DC | PRN

## 2013-02-16 NOTE — Progress Notes (Signed)
IV's and tele monitor d/c at this time; pt, wife, and sons given d/c instructions; all verbalized understanding; pt to d/c home with wife; prescriptions given; pt escorted out via wheelchair and NT.

## 2013-02-16 NOTE — Progress Notes (Signed)
Subjective:  Up without problems - but a bit dizzy this AM with low BP after 10 AM meds. Better by ~1115 AM  Objective:  Vital Signs in the last 24 hours: Temp:  [97.6 F (36.4 C)-98 F (36.7 C)] 98 F (36.7 C) (11/26 0436) Pulse Rate:  [77-84] 78 (11/26 1054) Resp:  [18-20] 18 (11/26 0436) BP: (92-113)/(52-60) 95/52 mmHg (11/26 1054) SpO2:  [94 %-100 %] 94 % (11/26 0436)  Intake/Output from previous day:  Intake/Output Summary (Last 24 hours) at 02/16/13 1058 Last data filed at 02/16/13 1055  Gross per 24 hour  Intake    960 ml  Output   1600 ml  Net   -640 ml    Physical Exam: General appearance: alert, cooperative and no distress Lungs: clear to auscultation bilaterally Heart: regular rate and rhythm Abd: soft/NT/ND/NABS Ext: no C/C/E   Rate: 78  Rhythm: normal sinus rhythm  Lab Results:  Recent Labs  02/16/13 0439  WBC 12.9*  HGB 15.2  PLT 225    Recent Labs  02/15/13 0451 02/16/13 0439  NA 135 134*  K 3.9 4.6  CL 104 102  CO2 18* 19  GLUCOSE 123* 118*  BUN 20 24*  CREATININE 1.09 1.32    Recent Labs  02/13/13 1425  TROPONINI 3.20*   No results found for this basename: INR,  in the last 72 hours  Imaging: Imaging results have been reviewed  Cardiac Studies:  Assessment/Plan:   Principal Problem:   STEMI of anterior wall - 02/10/13 Active Problems:   CAD S/P Prox-Mid LAD; Promus DES    HTN (hypertension)- now hypotensive post MI   Cardiogenic shock - resolved 11/24   Ventricular fibrillation - occurred en route to [facility]; CPR & Defib by EMS   PAF 02/13/13; peri-infarction- NSR on AMIO   LV dysfunction, EF by echo 02/11/13 45-50%   Acute combined systolic and diastolic heart failure   HLD (hyperlipidemia)   AAA (abdominal aortic aneurysm)   PLAN: Will discuss lasix & ACE-I home dose with MD  Corine Shelter PA-C Beeper [phone] 02/16/2013, 10:58 AM  I have seen and evaluated the patient this AM along with Corine Shelter, PA. I agree  with his findings, examination as well as impression recommendations.  He looks much better overall this AM.  Told RN that he felt a bit light-headed/dizzy.  Less dizzy after walking with [facility].  BP still a bit soft after 10 AM meds -- wanted to see how he felt with ambulation.    BP remains too low to safely add ACE-I back, will stop for now  Continue low dose BB and low dose Lasix ~20 mg daily to avoid volume overload.  Taper PO Amiodarone per protocol - may not need long term. If not recurrence of Afib, may not require long-term AC  Did not feel comfortable with 11AM d/c until he proved able to walk in hall without trouble / orthostatic Sx.  He just returned from a walk at the time of my visit.  With medication adjustments made, I feel that he is stable for d/c now off of ACE-I for now.    Will f/u with me or Sherre Poot in ~2 weeks.  Phase 2 [facility] ordered. OK to resume home urologic meds & vitamins.  Marykay Lex, M.D., M.S. [facility] [address] [place], [place]  [address]  [phone] Pager # [phone] 02/16/2013 11:57 AM

## 2013-02-16 NOTE — Progress Notes (Signed)
CARDIAC REHAB PHASE I   PRE:  Rate/Rhythm: 84 SR  BP:  Supine:   Sitting: 92/60  Standing:    SaO2: 94 RA  MODE:  Ambulation: 550 ft   POST:  Rate/Rhythm: 104  BP:  Supine:   Sitting: 100/60  Standing:    SaO2: 94 RA 0928-1010 Pt tolerated ambulation well without /o of cp or SOB. VS stable. Completed MI education with pt and wife. They voice understanding.   Melina Copa RN 02/16/2013 10:09 AM

## 2013-02-16 NOTE — Discharge Summary (Signed)
Patient ID: [name],  MRN: [id number], DOB/AGE: 05/02/1931 79 y.o.  Admit date: 02/10/2013 Discharge date: 02/16/2013  Primary Care Provider: Dr Theressa Millard Primary Cardiologist: Dr Herbie Baltimore  Discharge Diagnoses Principal Problem:   STEMI of anterior wall - 02/10/13 Active Problems:   CAD S/P Prox-Mid LAD; Promus DES    HTN (hypertension)- now hypotensive post MI   Cardiogenic shock - resolved 11/24   Ventricular fibrillation - occurred en route to [facility]; CPR & Defib by EMS   PAF 02/13/13; peri-infarction- NSR on AMIO   LV dysfunction, EF by echo 02/11/13 45-50%   Acute combined systolic and diastolic heart failure   HLD (hyperlipidemia)   AAA (abdominal aortic aneurysm)    Procedures: Urgent cath/ LAD DES (X2) 02/10/13                        Echo 02/11/13  Hospital Course:  [name] is a 79 y.o. male followed by Dr Theressa Millard with a history of hypertension, hyperlipidemia, and Type 2 NIDDM with a known  4 cm AAA, who is otherwise very active. He presented the morning of11/20/2014 to the [facility] with chest pain.. An ECG showed diffuse inferolateral ST depressions. He continued to have chest discomfort despite having been given sublingual nitroglycerin and morphine and a follow up EKG showed 3-4 mm elevations in V2 with at least 1-2 mm elevations in the remainder the precordial leads. Code STEMI was called, and the patient was brought to the directly to the cardiac catheterization lab. En route to the emergency room, the patient suffered ventricular fibrillation after recurrence of severe pain. The EMS crew performed CPR for roughly 2 minutes with one shock restoring a perfusing rhythm. His initial blood pressure after CPR was in the 140s, however upon arrival to the Cath Lab his blood pressures were in the 90s systolic as was his oxygen saturations in the 80-90% range. Cath showed severe LAD disease (single vessel) with am EF of 45%. He underwent LAD PCI with two  Promus DES placed. He was shocky post PCI and was placed on Dopamine. He was also felt to be in in acute pulmonary edema. He responded to Lasix and by the morning of the 21st he was improving. Echo on 02/11/13 showed his EF to be 45-50%. Dopamine was weaned off and he was placed on low dose beta bloker, ASA 81 mg, Brilinta, Lasix BID, and Lipitor 80 mg. We tried him on an ACE but his pressure was too soft.          Early on the morning of the 23d he went into rapid AF and was treated initially by the fellow with ibutilide but this was later changed to Amiodarone and he converted to NSR. The plan is for a month of Amiodarone unless he has recurrent PAF. Dr Herbie Baltimore did not feel he need anticoagulation unless he had recurrent PAF.           He was transferred to telemetry and ambulated. We kept him till noon on the 26th to make sure he tolerated his medications.  Dr Herbie Baltimore feels he can be discharge this afternoon. We will taper his Amiodarone as an OP with plans to stop this in a month if he has no recurrent PAF. As an OP he will need follow up lipids,LFTs, and re challenge with an ACE.    Discharge Vitals:  Blood pressure 95/52, pulse 78, temperature 98 F (36.7 C), temperature  source Oral, resp. rate 18, height 5\' 10"  (1.778 m), weight 190 lb 14.7 oz (86.6 kg), SpO2 94.00%.    Labs: Results for orders placed during the hospital encounter of 02/10/13 (from the past 48 hour(s))  HEPARIN LEVEL (UNFRACTIONATED)     Status: None   Collection Time    02/14/13 12:54 PM      Result Value Range   Heparin Unfractionated 0.34  0.30 - 0.70 IU/mL   Comment:            IF HEPARIN RESULTS ARE BELOW     EXPECTED VALUES, AND PATIENT     DOSAGE HAS BEEN CONFIRMED,     SUGGEST FOLLOW UP TESTING     OF ANTITHROMBIN III LEVELS.  HEPARIN LEVEL (UNFRACTIONATED)     Status: Abnormal   Collection Time    02/14/13  8:15 PM      Result Value Range   Heparin Unfractionated 0.22 (*) 0.30 - 0.70 IU/mL   Comment:             IF HEPARIN RESULTS ARE BELOW     EXPECTED VALUES, AND PATIENT     DOSAGE HAS BEEN CONFIRMED,     SUGGEST FOLLOW UP TESTING     OF ANTITHROMBIN III LEVELS.  BASIC METABOLIC PANEL     Status: Abnormal   Collection Time    02/15/13  4:51 AM      Result Value Range   Sodium 135  135 - 145 mEq/L   Potassium 3.9  3.5 - 5.1 mEq/L   Chloride 104  96 - 112 mEq/L   CO2 18 (*) 19 - 32 mEq/L   Glucose, Bld 123 (*) 70 - 99 mg/dL   BUN 20  6 - 23 mg/dL   Creatinine, Ser 1.61  0.50 - 1.35 mg/dL   Calcium 8.5  8.4 - 09.6 mg/dL   GFR calc non Af Amer 63 (*) >90 mL/min   GFR calc Af Amer 73 (*) >90 mL/min   Comment: (NOTE)     The eGFR has been calculated using the CKD EPI equation.     This calculation has not been validated in all clinical situations.     eGFR's persistently <90 mL/min signify possible Chronic Kidney     Disease.  HEPARIN LEVEL (UNFRACTIONATED)     Status: Abnormal   Collection Time    02/15/13  4:51 AM      Result Value Range   Heparin Unfractionated 0.21 (*) 0.30 - 0.70 IU/mL   Comment:            IF HEPARIN RESULTS ARE BELOW     EXPECTED VALUES, AND PATIENT     DOSAGE HAS BEEN CONFIRMED,     SUGGEST FOLLOW UP TESTING     OF ANTITHROMBIN III LEVELS.  PRO B NATRIURETIC PEPTIDE     Status: Abnormal   Collection Time    02/15/13  4:51 AM      Result Value Range   Pro B Natriuretic peptide (BNP) 4078.0 (*) 0 - 450 pg/mL  BASIC METABOLIC PANEL     Status: Abnormal   Collection Time    02/16/13  4:39 AM      Result Value Range   Sodium 134 (*) 135 - 145 mEq/L   Potassium 4.6  3.5 - 5.1 mEq/L   Chloride 102  96 - 112 mEq/L   CO2 19  19 - 32 mEq/L   Glucose, Bld 118 (*) 70 - 99 mg/dL  BUN 24 (*) 6 - 23 mg/dL   Creatinine, Ser 8.11  0.50 - 1.35 mg/dL   Calcium 8.7  8.4 - 91.4 mg/dL   GFR calc non Af Amer 50 (*) >90 mL/min   GFR calc Af Amer 58 (*) >90 mL/min   Comment: (NOTE)     The eGFR has been calculated using the CKD EPI equation.     This calculation has  not been validated in all clinical situations.     eGFR's persistently <90 mL/min signify possible Chronic Kidney     Disease.  CBC     Status: Abnormal   Collection Time    02/16/13  4:39 AM      Result Value Range   WBC 12.9 (*) 4.0 - 10.5 K/uL   RBC 4.76  4.22 - 5.81 MIL/uL   Hemoglobin 15.2  13.0 - 17.0 g/dL   HCT 78.2  95.6 - 21.3 %   MCV 91.6  78.0 - 100.0 fL   MCH 31.9  26.0 - 34.0 pg   MCHC 34.9  30.0 - 36.0 g/dL   RDW 08.6  57.8 - 46.9 %   Platelets 225  150 - 400 K/uL    Disposition:  Follow-up Information   Follow up with KILROY,LUKE K, PA-C. (office will call you)    Specialty:  Cardiology   Contact information:   [address] [place] [address] [phone]       Discharge Medications:    Medication List    STOP taking these medications       aspirin 325 MG tablet  Replaced by:  aspirin 81 MG chewable tablet     atenolol 50 MG tablet  Commonly known as:  TENORMIN     lisinopril 10 MG tablet  Commonly known as:  PRINIVIL,ZESTRIL     Red Yeast Rice 600 MG Caps      TAKE these medications       allopurinol 100 MG tablet  Commonly known as:  ZYLOPRIM  Take 100 mg by mouth 2 (two) times daily.     amiodarone 400 MG tablet  Commonly known as:  PACERONE  Take one tablet twice a day through Nov 30th. Starting Dec 1st take one tablet daily till instructed to decrease.     aspirin 81 MG chewable tablet  Chew 1 tablet (81 mg total) by mouth daily.     atorvastatin 80 MG tablet  Commonly known as:  LIPITOR  Take 1 tablet (80 mg total) by mouth daily at 6 PM.     CINNAMON PO  Take 1,000 mg by mouth 2 (two) times daily.     dutasteride 0.5 MG capsule  Commonly known as:  AVODART  Take 0.5 mg by mouth daily.     furosemide 20 MG tablet  Commonly known as:  LASIX  Take 1 tablet (20 mg total) by mouth daily.     GLUCOSAMINE 1500 COMPLEX PO  Take 1,500 mg by mouth 2 (two) times daily.     ICAPS MV PO  Take 1 capsule by mouth  2 (two) times daily.     metoprolol tartrate 12.5 mg Tabs tablet  Commonly known as:  LOPRESSOR  Take 0.5 tablets (12.5 mg total) by mouth 2 (two) times daily.     nitroGLYCERIN 0.4 MG SL tablet  Commonly known as:  NITROSTAT  Place 1 tablet (0.4 mg total) under the tongue every 5 (five) minutes as needed for chest pain.  primidone 50 MG tablet  Commonly known as:  MYSOLINE  Take 50 mg by mouth 2 (two) times daily.     Q-10 CO-ENZYME PO  Take 100 mg by mouth daily.     Ticagrelor 90 MG Tabs tablet  Commonly known as:  BRILINTA  Take 1 tablet (90 mg total) by mouth 2 (two) times daily.         Duration of Discharge Encounter: Greater than 30 minutes including physician time.  Signed, Corine Shelter PA-C 02/16/2013 11:58 AM  He looks much better overall this AM. Told RN that he felt a bit light-headed/dizzy. Less dizzy after walking with [facility]. BP still a bit soft after 10 AM meds -- wanted to see how he felt with ambulation.  BP remains too low to safely add ACE-I back, will stop for now  Continue low dose BB and low dose Lasix ~20 mg daily to avoid volume overload. Taper PO Amiodarone per protocol - may not need long term. If not recurrence of Afib, may not require long-term AC  Did not feel comfortable with 11AM d/c until he proved able to walk in hall without trouble / orthostatic Sx. He just returned from a walk at the time of my visit.  With medication adjustments made, I feel that he is stable for d/c now off of ACE-I for now.  Will f/u with me or Sherre Poot in ~2 weeks.  Phase 2 [facility] ordered.  OK to resume home urologic meds & vitamins.  I personally spent 20-25 min in direct consultation with the patient re: d/c expectations, medications management & f/u.  Was not able to d/c by 11 AM due to concern for low BP & orthostatic Sx.  Improved by the time of actually d/c.   Marykay Lex, M.D., M.S. [facility] [address] [place], [place]  [address]  [phone] Pager # [phone] 02/16/2013 12:44 PM

## 2013-02-22 ENCOUNTER — Telehealth: Payer: Self-pay | Admitting: Cardiovascular Disease

## 2013-02-22 ENCOUNTER — Telehealth: Payer: Self-pay | Admitting: Cardiology

## 2013-02-22 NOTE — Telephone Encounter (Signed)
Returned call and pt verified x 2.  Pt informed message received and it is okay to take Claritin and a stool softener.  Pt advised NOT to take laxatives, but daily stool softener is fine.  Pt verbalized understanding and agreed w/ plan.

## 2013-02-22 NOTE — Telephone Encounter (Signed)
Wants to know if he can take Claritin and a stool sofetner?

## 2013-02-22 NOTE — Telephone Encounter (Signed)
Son called said  thatt Dr Allyson Sabal was going to recommend a cardiologist in [place].Would he please call and let him know.

## 2013-02-24 ENCOUNTER — Telehealth: Payer: Self-pay | Admitting: *Deleted

## 2013-02-24 NOTE — Telephone Encounter (Signed)
Per Dr Allyson Sabal.  Dr Vallery Ridge, Dr Hampton Abbot, or Dr Hoy Finlay. Lm with names on message

## 2013-02-24 NOTE — Telephone Encounter (Signed)
Faxed cardiac rehab phase ll order form--signed

## 2013-02-25 ENCOUNTER — Telehealth: Payer: Self-pay | Admitting: Cardiology

## 2013-02-25 MED ORDER — PRASUGREL HCL 10 MG PO TABS: 10.0000 mg | ORAL_TABLET | Freq: Every day | ORAL | Status: DC

## 2013-02-25 NOTE — Telephone Encounter (Signed)
Brilinta  D/C'd and Effient 10mg  qd started.

## 2013-02-25 NOTE — Telephone Encounter (Signed)
[name] is calling about Brilienta 90mg  .. Is causing him to have shortness of breath . When he wake up he finds himself gasping for air and has a little numbmess in his fingers .Marland Kitchen Please Call    Thanks

## 2013-02-25 NOTE — Telephone Encounter (Signed)
Problems with SOB/gasping for air occasionally since taking Brilinta.  Per Belenda Cruise change to Effient 10mg  QD.  Patient notified and voiced understanding.

## 2013-03-03 ENCOUNTER — Encounter: Payer: Self-pay | Admitting: Cardiology

## 2013-03-03 ENCOUNTER — Ambulatory Visit (INDEPENDENT_AMBULATORY_CARE_PROVIDER_SITE_OTHER): Payer: Medicare Other | Admitting: Cardiology

## 2013-03-03 VITALS — BP 104/70 | HR 51 | Ht 71.0 in | Wt 182.0 lb

## 2013-03-03 DIAGNOSIS — I10 Essential (primary) hypertension: Secondary | ICD-10-CM

## 2013-03-03 DIAGNOSIS — I714 Abdominal aortic aneurysm, without rupture, unspecified: Secondary | ICD-10-CM

## 2013-03-03 DIAGNOSIS — Z9861 Coronary angioplasty status: Secondary | ICD-10-CM

## 2013-03-03 DIAGNOSIS — E785 Hyperlipidemia, unspecified: Secondary | ICD-10-CM

## 2013-03-03 DIAGNOSIS — I2510 Atherosclerotic heart disease of native coronary artery without angina pectoris: Secondary | ICD-10-CM

## 2013-03-03 DIAGNOSIS — I4891 Unspecified atrial fibrillation: Secondary | ICD-10-CM

## 2013-03-03 DIAGNOSIS — I2109 ST elevation (STEMI) myocardial infarction involving other coronary artery of anterior wall: Secondary | ICD-10-CM

## 2013-03-03 NOTE — Assessment & Plan Note (Signed)
Complicated by VF arrest, CPR en route to the ER

## 2013-03-03 NOTE — Assessment & Plan Note (Signed)
He is now on Q 6 month surveillance with Dr [name]

## 2013-03-03 NOTE — Assessment & Plan Note (Signed)
His B/P is still soft- hold off on ACE for now

## 2013-03-03 NOTE — Patient Instructions (Signed)
You may resume driving after 6 weeks. Stop Amiodarone after your prescription runs out. OK to start cardiac rehab. See Dr Herbie Baltimore in 3  months

## 2013-03-03 NOTE — Assessment & Plan Note (Signed)
Rx'd with LAD DES. EF 45-50% post PCI

## 2013-03-03 NOTE — Assessment & Plan Note (Signed)
On statin, He will need lipids and LFTs at his next follow up

## 2013-03-03 NOTE — Assessment & Plan Note (Signed)
No recurrent AF

## 2013-03-03 NOTE — Progress Notes (Signed)
03/03/2013 [name]   1931/10/03  [id number]  Primary Physicia Cameron Bos, MD Primary Cardiologist: Dr [name]  HPI:  [name] is a pleasant, active, 79 y/o who is followed by Dr [name]. He has a history of an AAA that is followed by Dr [name] (most recent US Nov 2014- 4.3cm. He is now on a Q 6 month follow up for this). He presented to [facility] 02/10/13 as a STEMI. He had VF arrest en route to the ER and defibrillated and had CPR by EMS. He had an urgent cath and subsequent LAD DES by Dr [name]. Post op he required IV pressors for 24 hrs. On 11/23 he had rapid AF that converted with Amiodarone. His EF was 45-50% post PCI by echo. He was discharge on Brilinta but this was changed to Effient after two weeks because of some SOB. He presents today for a follow up visit. He has done well since discharge. He had some sharp shooting chest pains but nothing like his pre MI symptoms. He denies any palpitations or tachycardia.    Current Outpatient Prescriptions  Medication Sig Dispense Refill  . allopurinol (ZYLOPRIM) 100 MG tablet Take 100 mg by mouth 2 (two) times daily.      Marland Kitchen aspirin 81 MG chewable tablet Chew 1 tablet (81 mg total) by mouth daily.      Marland Kitchen atorvastatin (LIPITOR) 80 MG tablet Take 1 tablet (80 mg total) by mouth daily at 6 PM.  30 tablet  11  . CINNAMON PO Take 1,000 mg by mouth 2 (two) times daily.      . Coenzyme Q10 (Q-10 CO-ENZYME PO) Take 100 mg by mouth daily.      Marland Kitchen dutasteride (AVODART) 0.5 MG capsule Take 0.5 mg by mouth daily.      . furosemide (LASIX) 20 MG tablet Take 1 tablet (20 mg total) by mouth daily.  30 tablet  11  . Glucosamine-Chondroit-Vit C-Mn (GLUCOSAMINE 1500 COMPLEX PO) Take 1,500 mg by mouth 2 (two) times daily.      . metoprolol tartrate (LOPRESSOR) 12.5 mg TABS tablet Take 0.5 tablets (12.5 mg total) by mouth 2 (two) times daily.  30 tablet  11  . Multiple Vitamins-Minerals (ICAPS MV PO) Take 1 capsule by mouth 2 (two) times daily.      .  nitroGLYCERIN (NITROSTAT) 0.4 MG SL tablet Place 1 tablet (0.4 mg total) under the tongue every 5 (five) minutes as needed for chest pain.  25 tablet  2  . prasugrel (EFFIENT) 10 MG TABS tablet Take 1 tablet (10 mg total) by mouth daily.  30 tablet  3  . primidone (MYSOLINE) 50 MG tablet Take 50 mg by mouth 2 (two) times daily.       No current facility-administered medications for this visit.    Allergies  Allergen Reactions  . Ciprofloxacin Nausea Only    History   Social History  . Marital Status: Married    Spouse Name: N/A    Number of Children: N/A  . Years of Education: N/A   Occupational History  . Not on file.   Social History Main Topics  . Smoking status: Former Games developer  . Smokeless tobacco: Not on file  . Alcohol Use: No  . Drug Use: No  . Sexual Activity: Not on file   Other Topics Concern  . Not on file   Social History Narrative  . No narrative on file     Review of Systems: General: negative for chills,  fever, night sweats or weight changes.  Cardiovascular: negative for dyspnea on exertion, edema, orthopnea, palpitations, paroxysmal nocturnal dyspnea or shortness of breath Dermatological: negative for rash Respiratory: negative for cough or wheezing Urologic: negative for hematuria Abdominal: negative for nausea, vomiting, diarrhea, bright red blood per rectum, melena, or hematemesis Neurologic: negative for visual changes, syncope, or dizziness All other systems reviewed and are otherwise negative except as noted above.    Blood pressure 104/70, pulse 51, height 5\' 11"  (1.803 m), weight 182 lb (82.555 kg).  General appearance: alert, cooperative and no distress Neck: no carotid bruit and no JVD Lungs: clear to auscultation bilaterally Heart: regular rate and rhythm  EKG NSR, SB, TWI 1,AVL, V1-V3  ASSESSMENT AND PLAN:   STEMI of anterior wall - 02/10/13 Complicated by VF arrest, CPR en route to the ER  CAD S/P Prox-Mid LAD; Promus DES  02/10/13 Rx'd with LAD DES. EF 45-50% post PCI  PAF 02/13/13; peri-infarction- NSR on AMIO No recurrent AF  AAA (abdominal aortic aneurysm)- 4.3 cm by [place] 11/14 He is now on Q 6 month surveillance with Dr [name]  HLD (hyperlipidemia) On statin, He will need lipids and LFTs at his next follow up  HTN (hypertension)- now hypotensive post MI His B/P is still soft- hold off on ACE for now    PLAN  Discussed with Dr [name], OK to stop Amiodarone. No driving till he is ou t6 weeks. OK to start cardiac rehab. Follow up with Dr [name] in 3 months, lipids and LFTs at next visit.  [name] KPA-C 03/03/2013 11:49 AM

## 2013-03-08 ENCOUNTER — Telehealth: Payer: Self-pay | Admitting: Cardiology

## 2013-03-08 MED ORDER — PRASUGREL HCL 10 MG PO TABS: 10.0000 mg | ORAL_TABLET | Freq: Every day | ORAL | Status: DC

## 2013-03-08 NOTE — Telephone Encounter (Signed)
Returned call and pt verified x 2.  Pt informed samples left at front desk.  Pt also requested Rx to pharmacy and sent.  RN added lorazepam 1 mg prn to med list.  Pt also informed he can take otc Claritin prn allergies and that it may cause increased nosebleeds r/t its drying effect (antihistamine).  Pt advised to hold for a few days if he is having nosebleeds and to be sure to stay hydrated while taking antihistamines.  Pt verbalized understanding and agreed w/ plan.  Rx sent to pharmacy.

## 2013-03-08 NOTE — Telephone Encounter (Signed)
Running out of his Effient samples,if you don"t have any please call in a prescription for him.Please call [facility] to [phone] He also forgot to tell you he is on Lorazepam as needed.He wants to know if he can take Claritin since he has occasional nosebleeds?

## 2013-03-09 ENCOUNTER — Telehealth: Payer: Self-pay | Admitting: *Deleted

## 2013-03-09 NOTE — Telephone Encounter (Signed)
SIGNED ORDER FOR EXERCISE PARAMETERS WITH ABD AORTIC ANEURYSM-FAXED ON 03/08/13

## 2013-03-22 ENCOUNTER — Encounter (HOSPITAL_COMMUNITY)
Admission: RE | Admit: 2013-03-22 | Discharge: 2013-03-22 | Disposition: A | Discharge status: Home / Self Care | Payer: Medicare Other | Source: Ambulatory Visit | Attending: Cardiology | Admitting: Cardiology

## 2013-03-22 NOTE — Progress Notes (Signed)
Cardiac Rehab Medication Review by a Pharmacist  Does the patient  feel that his/her medications are working for him/her?  yes  Has the patient been experiencing any side effects to the medications prescribed?  Yes, nausea but does not know which one is causing  Does the patient measure his/her own blood pressure or blood glucose at home?  Yes, monitors monthly BP  Does the patient have any problems obtaining medications due to transportation or finances?   no  Understanding of regimen: good Understanding of indications: good Potential of compliance: good    Pharmacist comments: [name] is a pleasant 79 yo M presenting for cardiac rehab.  He was very cooperative and had good questions regarding his meds, especially the Lipitor.  He was concerned with the higher strength of 80mg .  I assured him this was a common dose especially after a cardiac event.  When he first had his event, he was started on Brilinta, but d/t nose bleeds, he know takes Prasegrel.  As far as side effects go, he does have some nausea, but its not intolerable.  He is unsure which agent is causing it.  te Lorazepam is a PRN order that he only takes at most monthly and just a quarter tab (0.25mg ) prior to giving a presentation at church.  He is returning to driving today, and is excited about that.  He has not issues acquiring his meds.  The only med he has trouble remembering is the 6pm Lipitor.  I told him, if he wanted, he could move this to the AM with his other meds d/t its longer duration of action (not as necessary to take in evening) if it would help him remember it.  Shelba Flake Achilles Dunk, PharmD Clinical Pharmacist - Resident Pager: [phone] Pharmacy: [phone] 03/22/2013 8:30 AM

## 2013-03-28 ENCOUNTER — Encounter (HOSPITAL_COMMUNITY)
Admission: RE | Admit: 2013-03-28 | Discharge: 2013-03-28 | Disposition: A | Discharge status: Home / Self Care | Payer: Medicare Other | Source: Ambulatory Visit | Attending: Cardiology | Admitting: Cardiology

## 2013-03-28 ENCOUNTER — Encounter (HOSPITAL_COMMUNITY): Payer: Medicare Other

## 2013-03-28 DIAGNOSIS — Z5189 Encounter for other specified aftercare: Secondary | ICD-10-CM | POA: Insufficient documentation

## 2013-03-28 DIAGNOSIS — I714 Abdominal aortic aneurysm, without rupture, unspecified: Secondary | ICD-10-CM | POA: Insufficient documentation

## 2013-03-28 DIAGNOSIS — E785 Hyperlipidemia, unspecified: Secondary | ICD-10-CM | POA: Insufficient documentation

## 2013-03-28 DIAGNOSIS — I5041 Acute combined systolic (congestive) and diastolic (congestive) heart failure: Secondary | ICD-10-CM | POA: Insufficient documentation

## 2013-03-28 DIAGNOSIS — I10 Essential (primary) hypertension: Secondary | ICD-10-CM | POA: Insufficient documentation

## 2013-03-28 DIAGNOSIS — I2510 Atherosclerotic heart disease of native coronary artery without angina pectoris: Secondary | ICD-10-CM | POA: Insufficient documentation

## 2013-03-28 DIAGNOSIS — I4891 Unspecified atrial fibrillation: Secondary | ICD-10-CM | POA: Insufficient documentation

## 2013-03-28 DIAGNOSIS — Z87891 Personal history of nicotine dependence: Secondary | ICD-10-CM | POA: Insufficient documentation

## 2013-03-28 DIAGNOSIS — E119 Type 2 diabetes mellitus without complications: Secondary | ICD-10-CM | POA: Insufficient documentation

## 2013-03-28 DIAGNOSIS — I739 Peripheral vascular disease, unspecified: Secondary | ICD-10-CM | POA: Insufficient documentation

## 2013-03-28 DIAGNOSIS — I4901 Ventricular fibrillation: Secondary | ICD-10-CM | POA: Insufficient documentation

## 2013-03-28 DIAGNOSIS — I2119 ST elevation (STEMI) myocardial infarction involving other coronary artery of inferior wall: Secondary | ICD-10-CM | POA: Insufficient documentation

## 2013-03-28 DIAGNOSIS — Z7982 Long term (current) use of aspirin: Secondary | ICD-10-CM | POA: Insufficient documentation

## 2013-03-28 LAB — GLUCOSE, CAPILLARY: Glucose-Capillary: 93 mg/dL (ref 70–99)

## 2013-03-28 NOTE — Progress Notes (Signed)
Pt started cardiac rehab today.  Pt tolerated light exercise without difficulty. Telemetry rhythm sinus without ectopy. Initial resting blood pressure 94/54 sitting. Patient asymptomatic. Patient given water recheck blood pressure 108/60 sitting. Standing blood pressure 112/60. Don exercise blood pressures were within normal limits. Exit blood pressure 94/60. Patient remained asymptomatic. Recheck blood pressure 98/60. [name] called and notified. No new orders received.  Will fax today's exercise flow sheets for [name] to review. Will continue to monitor the patient throughout  the program. Don left cardiac rehab without symptoms or complaints.

## 2013-03-30 ENCOUNTER — Encounter (HOSPITAL_COMMUNITY)
Admission: RE | Admit: 2013-03-30 | Discharge: 2013-03-30 | Disposition: A | Discharge status: Home / Self Care | Payer: Medicare Other | Source: Ambulatory Visit | Attending: Cardiology | Admitting: Cardiology

## 2013-03-30 ENCOUNTER — Encounter (HOSPITAL_COMMUNITY): Payer: Medicare Other

## 2013-04-01 ENCOUNTER — Encounter (HOSPITAL_COMMUNITY): Payer: Medicare Other

## 2013-04-01 ENCOUNTER — Telehealth (HOSPITAL_COMMUNITY): Payer: Self-pay | Admitting: Internal Medicine

## 2013-04-02 ENCOUNTER — Other Ambulatory Visit: Payer: Self-pay | Admitting: Cardiology

## 2013-04-04 ENCOUNTER — Encounter (HOSPITAL_COMMUNITY)
Admission: RE | Admit: 2013-04-04 | Discharge: 2013-04-04 | Disposition: A | Discharge status: Home / Self Care | Payer: Medicare Other | Source: Ambulatory Visit | Attending: Cardiology | Admitting: Cardiology

## 2013-04-04 ENCOUNTER — Encounter (HOSPITAL_COMMUNITY): Payer: Medicare Other

## 2013-04-06 ENCOUNTER — Encounter (HOSPITAL_COMMUNITY): Payer: Medicare Other

## 2013-04-06 ENCOUNTER — Encounter (HOSPITAL_COMMUNITY)
Admission: RE | Admit: 2013-04-06 | Discharge: 2013-04-06 | Disposition: A | Discharge status: Home / Self Care | Payer: Medicare Other | Source: Ambulatory Visit | Attending: Cardiology | Admitting: Cardiology

## 2013-04-08 ENCOUNTER — Encounter (HOSPITAL_COMMUNITY): Payer: Medicare Other

## 2013-04-08 ENCOUNTER — Encounter (HOSPITAL_COMMUNITY)
Admission: RE | Admit: 2013-04-08 | Discharge: 2013-04-08 | Disposition: A | Discharge status: Home / Self Care | Payer: Medicare Other | Source: Ambulatory Visit | Attending: Cardiology | Admitting: Cardiology

## 2013-04-08 NOTE — Progress Notes (Signed)
Reviewed home exercise with pt today.  Pt plans to continue walking and using stationary bike at home for exercise.  Reviewed THR, pulse (needs practice), RPE, sign and symptoms, NTG use, and when to call 911 or MD.  Pt voiced understanding. Alberteen Sam, MA, ACSM RCEP

## 2013-04-11 ENCOUNTER — Encounter (HOSPITAL_COMMUNITY): Payer: Medicare Other

## 2013-04-11 ENCOUNTER — Encounter (HOSPITAL_COMMUNITY)
Admission: RE | Admit: 2013-04-11 | Discharge: 2013-04-11 | Disposition: A | Discharge status: Home / Self Care | Payer: Medicare Other | Source: Ambulatory Visit | Attending: Cardiology | Admitting: Cardiology

## 2013-04-13 ENCOUNTER — Encounter: Payer: Self-pay | Admitting: Cardiology

## 2013-04-13 ENCOUNTER — Encounter (HOSPITAL_COMMUNITY)
Admission: RE | Admit: 2013-04-13 | Discharge: 2013-04-13 | Disposition: A | Discharge status: Home / Self Care | Payer: Medicare Other | Source: Ambulatory Visit | Attending: Cardiology | Admitting: Cardiology

## 2013-04-13 ENCOUNTER — Encounter (HOSPITAL_COMMUNITY): Payer: Medicare Other

## 2013-04-15 ENCOUNTER — Encounter (HOSPITAL_COMMUNITY): Payer: Medicare Other

## 2013-04-15 ENCOUNTER — Encounter (HOSPITAL_COMMUNITY)
Admission: RE | Admit: 2013-04-15 | Discharge: 2013-04-15 | Disposition: A | Discharge status: Home / Self Care | Payer: Medicare Other | Source: Ambulatory Visit | Attending: Cardiology | Admitting: Cardiology

## 2013-04-18 ENCOUNTER — Encounter (HOSPITAL_COMMUNITY): Payer: Medicare Other

## 2013-04-18 ENCOUNTER — Encounter (HOSPITAL_COMMUNITY)
Admission: RE | Admit: 2013-04-18 | Discharge: 2013-04-18 | Disposition: A | Discharge status: Home / Self Care | Payer: Medicare Other | Source: Ambulatory Visit | Attending: Cardiology | Admitting: Cardiology

## 2013-04-18 ENCOUNTER — Other Ambulatory Visit: Payer: Self-pay | Admitting: Cardiology

## 2013-04-20 ENCOUNTER — Encounter (HOSPITAL_COMMUNITY): Payer: Medicare Other

## 2013-04-20 ENCOUNTER — Encounter (HOSPITAL_COMMUNITY)
Admission: RE | Admit: 2013-04-20 | Discharge: 2013-04-20 | Disposition: A | Discharge status: Home / Self Care | Payer: Medicare Other | Source: Ambulatory Visit | Attending: Cardiology | Admitting: Cardiology

## 2013-04-22 ENCOUNTER — Encounter (HOSPITAL_COMMUNITY)
Admission: RE | Admit: 2013-04-22 | Discharge: 2013-04-22 | Disposition: A | Discharge status: Home / Self Care | Payer: Medicare Other | Source: Ambulatory Visit | Attending: Cardiology | Admitting: Cardiology

## 2013-04-22 ENCOUNTER — Encounter (HOSPITAL_COMMUNITY): Payer: Medicare Other

## 2013-04-22 NOTE — Progress Notes (Signed)
Daryel November 80 y.o. male Nutrition Note Spoke with pt.  Nutrition Plan and Nutrition Survey goals reviewed with pt. Pt is following Step 2 of the Therapeutic Lifestyle Changes diet. Pt wants to lose wt. Pt c/o "wt gain when I first started." Pt wt today 84.5 kg, which is up 1.3 kg from admission. Wt loss discussed.  Pt expressed understanding of the information reviewed. Pt aware of nutrition education classes offered and plans on attending nutrition classes.  Nutrition Diagnosis   Food-and nutrition-related knowledge deficit related to lack of exposure to information as related to diagnosis of: ? CVD    Overweight related to excessive energy intake as evidenced by a BMI of 27.7  Nutrition RX/ Estimated Daily Nutrition Needs for: wt loss  1300-1800 Kcal, 35-50 gm fat, 8-12 gm sat fat, 1.2-1.8 gm trans-fat, <1500 mg sodium  Nutrition Intervention   Pt's individual nutrition plan reviewed with pt.   Benefits of adopting Therapeutic Lifestyle Changes discussed when Medficts reviewed.   Pt to attend the Portion Distortion class   Pt to attend the  ? Nutrition I class                     ? Nutrition II class    Pt given handouts for: ? Nutrition I class ? Nutrition II class ? (per pt request) Diabetes Blitz class   Continue client-centered nutrition education by RD, as part of interdisciplinary care. Goal(s)   Pt to identify food quantities necessary to achieve: ? wt loss to a goal wt of 159-177 lb (72.3-80.5 kg) at graduation from cardiac rehab.  Monitor and Evaluate progress toward nutrition goal with team. Nutrition Risk: Change to Moderate Derek Mound, M.Ed, RD, LDN, CDE 04/22/2013 10:52 AM

## 2013-04-25 ENCOUNTER — Encounter (HOSPITAL_COMMUNITY): Payer: Medicare Other

## 2013-04-25 ENCOUNTER — Encounter (HOSPITAL_COMMUNITY)
Admission: RE | Admit: 2013-04-25 | Discharge: 2013-04-25 | Disposition: A | Discharge status: Home / Self Care | Payer: Medicare Other | Source: Ambulatory Visit | Attending: Cardiology | Admitting: Cardiology

## 2013-04-25 DIAGNOSIS — I714 Abdominal aortic aneurysm, without rupture, unspecified: Secondary | ICD-10-CM | POA: Insufficient documentation

## 2013-04-25 DIAGNOSIS — R570 Cardiogenic shock: Secondary | ICD-10-CM | POA: Insufficient documentation

## 2013-04-25 DIAGNOSIS — I4901 Ventricular fibrillation: Secondary | ICD-10-CM | POA: Insufficient documentation

## 2013-04-25 DIAGNOSIS — I10 Essential (primary) hypertension: Secondary | ICD-10-CM | POA: Insufficient documentation

## 2013-04-25 DIAGNOSIS — I2109 ST elevation (STEMI) myocardial infarction involving other coronary artery of anterior wall: Secondary | ICD-10-CM | POA: Insufficient documentation

## 2013-04-25 DIAGNOSIS — Z5189 Encounter for other specified aftercare: Secondary | ICD-10-CM | POA: Insufficient documentation

## 2013-04-25 DIAGNOSIS — I739 Peripheral vascular disease, unspecified: Secondary | ICD-10-CM | POA: Insufficient documentation

## 2013-04-25 DIAGNOSIS — I4891 Unspecified atrial fibrillation: Secondary | ICD-10-CM | POA: Insufficient documentation

## 2013-04-25 DIAGNOSIS — E119 Type 2 diabetes mellitus without complications: Secondary | ICD-10-CM | POA: Insufficient documentation

## 2013-04-25 DIAGNOSIS — Z87891 Personal history of nicotine dependence: Secondary | ICD-10-CM | POA: Insufficient documentation

## 2013-04-25 DIAGNOSIS — I5041 Acute combined systolic (congestive) and diastolic (congestive) heart failure: Secondary | ICD-10-CM | POA: Insufficient documentation

## 2013-04-25 DIAGNOSIS — I2510 Atherosclerotic heart disease of native coronary artery without angina pectoris: Secondary | ICD-10-CM | POA: Insufficient documentation

## 2013-04-25 DIAGNOSIS — Z7982 Long term (current) use of aspirin: Secondary | ICD-10-CM | POA: Insufficient documentation

## 2013-04-25 DIAGNOSIS — E785 Hyperlipidemia, unspecified: Secondary | ICD-10-CM | POA: Insufficient documentation

## 2013-04-27 ENCOUNTER — Encounter (HOSPITAL_COMMUNITY): Payer: Medicare Other

## 2013-04-27 ENCOUNTER — Encounter (HOSPITAL_COMMUNITY)
Admission: RE | Admit: 2013-04-27 | Discharge: 2013-04-27 | Disposition: A | Discharge status: Home / Self Care | Payer: Medicare Other | Source: Ambulatory Visit | Attending: Cardiology | Admitting: Cardiology

## 2013-04-29 ENCOUNTER — Encounter (HOSPITAL_COMMUNITY): Payer: Medicare Other

## 2013-04-29 ENCOUNTER — Encounter (HOSPITAL_COMMUNITY)
Admission: RE | Admit: 2013-04-29 | Discharge: 2013-04-29 | Disposition: A | Discharge status: Home / Self Care | Payer: Medicare Other | Source: Ambulatory Visit | Attending: Cardiology | Admitting: Cardiology

## 2013-05-02 ENCOUNTER — Encounter (HOSPITAL_COMMUNITY)
Admission: RE | Admit: 2013-05-02 | Discharge: 2013-05-02 | Disposition: A | Discharge status: Home / Self Care | Payer: Medicare Other | Source: Ambulatory Visit | Attending: Cardiology | Admitting: Cardiology

## 2013-05-02 ENCOUNTER — Encounter (HOSPITAL_COMMUNITY): Payer: Medicare Other

## 2013-05-04 ENCOUNTER — Encounter (HOSPITAL_COMMUNITY): Payer: Medicare Other

## 2013-05-04 ENCOUNTER — Encounter (HOSPITAL_COMMUNITY)
Admission: RE | Admit: 2013-05-04 | Discharge: 2013-05-04 | Disposition: A | Discharge status: Home / Self Care | Payer: Medicare Other | Source: Ambulatory Visit | Attending: Cardiology | Admitting: Cardiology

## 2013-05-06 ENCOUNTER — Encounter (HOSPITAL_COMMUNITY)
Admission: RE | Admit: 2013-05-06 | Discharge: 2013-05-06 | Disposition: A | Discharge status: Home / Self Care | Payer: Medicare Other | Source: Ambulatory Visit | Attending: Cardiology | Admitting: Cardiology

## 2013-05-06 ENCOUNTER — Encounter (HOSPITAL_COMMUNITY): Payer: Medicare Other

## 2013-05-09 ENCOUNTER — Encounter (HOSPITAL_COMMUNITY)
Admission: RE | Admit: 2013-05-09 | Discharge: 2013-05-09 | Disposition: A | Discharge status: Home / Self Care | Payer: Medicare Other | Source: Ambulatory Visit | Attending: Cardiology | Admitting: Cardiology

## 2013-05-09 ENCOUNTER — Encounter (HOSPITAL_COMMUNITY): Payer: Medicare Other

## 2013-05-11 ENCOUNTER — Encounter (HOSPITAL_COMMUNITY): Payer: Medicare Other

## 2013-05-11 ENCOUNTER — Encounter (HOSPITAL_COMMUNITY)
Admission: RE | Admit: 2013-05-11 | Discharge: 2013-05-11 | Disposition: A | Discharge status: Home / Self Care | Payer: Medicare Other | Source: Ambulatory Visit | Attending: Cardiology | Admitting: Cardiology

## 2013-05-13 ENCOUNTER — Encounter (HOSPITAL_COMMUNITY): Payer: Medicare Other

## 2013-05-13 ENCOUNTER — Encounter (HOSPITAL_COMMUNITY)
Admission: RE | Admit: 2013-05-13 | Discharge: 2013-05-13 | Disposition: A | Discharge status: Home / Self Care | Payer: Medicare Other | Source: Ambulatory Visit | Attending: Cardiology | Admitting: Cardiology

## 2013-05-16 ENCOUNTER — Encounter (HOSPITAL_COMMUNITY)
Admission: RE | Admit: 2013-05-16 | Discharge: 2013-05-16 | Disposition: A | Discharge status: Home / Self Care | Payer: Medicare Other | Source: Ambulatory Visit | Attending: Cardiology | Admitting: Cardiology

## 2013-05-16 ENCOUNTER — Encounter (HOSPITAL_COMMUNITY): Payer: Medicare Other

## 2013-05-18 ENCOUNTER — Encounter (HOSPITAL_COMMUNITY)
Admission: RE | Admit: 2013-05-18 | Discharge: 2013-05-18 | Disposition: A | Discharge status: Home / Self Care | Payer: Medicare Other | Source: Ambulatory Visit | Attending: Cardiology | Admitting: Cardiology

## 2013-05-18 ENCOUNTER — Encounter (HOSPITAL_COMMUNITY): Payer: Medicare Other

## 2013-05-20 ENCOUNTER — Encounter (HOSPITAL_COMMUNITY): Payer: Medicare Other

## 2013-05-20 ENCOUNTER — Encounter (HOSPITAL_COMMUNITY)
Admission: RE | Admit: 2013-05-20 | Discharge: 2013-05-20 | Disposition: A | Discharge status: Home / Self Care | Payer: Medicare Other | Source: Ambulatory Visit | Attending: Cardiology | Admitting: Cardiology

## 2013-05-23 ENCOUNTER — Encounter (HOSPITAL_COMMUNITY)
Admission: RE | Admit: 2013-05-23 | Discharge: 2013-05-23 | Disposition: A | Discharge status: Home / Self Care | Payer: Medicare Other | Source: Ambulatory Visit | Attending: Cardiology | Admitting: Cardiology

## 2013-05-23 ENCOUNTER — Encounter (HOSPITAL_COMMUNITY): Payer: Medicare Other

## 2013-05-23 DIAGNOSIS — Z7982 Long term (current) use of aspirin: Secondary | ICD-10-CM | POA: Insufficient documentation

## 2013-05-23 DIAGNOSIS — I4901 Ventricular fibrillation: Secondary | ICD-10-CM | POA: Insufficient documentation

## 2013-05-23 DIAGNOSIS — I2510 Atherosclerotic heart disease of native coronary artery without angina pectoris: Secondary | ICD-10-CM | POA: Insufficient documentation

## 2013-05-23 DIAGNOSIS — E119 Type 2 diabetes mellitus without complications: Secondary | ICD-10-CM | POA: Insufficient documentation

## 2013-05-23 DIAGNOSIS — I714 Abdominal aortic aneurysm, without rupture, unspecified: Secondary | ICD-10-CM | POA: Insufficient documentation

## 2013-05-23 DIAGNOSIS — R570 Cardiogenic shock: Secondary | ICD-10-CM | POA: Insufficient documentation

## 2013-05-23 DIAGNOSIS — Z5189 Encounter for other specified aftercare: Secondary | ICD-10-CM | POA: Insufficient documentation

## 2013-05-23 DIAGNOSIS — I739 Peripheral vascular disease, unspecified: Secondary | ICD-10-CM | POA: Insufficient documentation

## 2013-05-23 DIAGNOSIS — I2109 ST elevation (STEMI) myocardial infarction involving other coronary artery of anterior wall: Secondary | ICD-10-CM | POA: Insufficient documentation

## 2013-05-23 DIAGNOSIS — E785 Hyperlipidemia, unspecified: Secondary | ICD-10-CM | POA: Insufficient documentation

## 2013-05-23 DIAGNOSIS — I10 Essential (primary) hypertension: Secondary | ICD-10-CM | POA: Insufficient documentation

## 2013-05-23 DIAGNOSIS — I5041 Acute combined systolic (congestive) and diastolic (congestive) heart failure: Secondary | ICD-10-CM | POA: Insufficient documentation

## 2013-05-23 DIAGNOSIS — I4891 Unspecified atrial fibrillation: Secondary | ICD-10-CM | POA: Insufficient documentation

## 2013-05-23 DIAGNOSIS — Z87891 Personal history of nicotine dependence: Secondary | ICD-10-CM | POA: Insufficient documentation

## 2013-05-25 ENCOUNTER — Encounter (HOSPITAL_COMMUNITY)
Admission: RE | Admit: 2013-05-25 | Discharge: 2013-05-25 | Disposition: A | Discharge status: Home / Self Care | Payer: Medicare Other | Source: Ambulatory Visit | Attending: Cardiology | Admitting: Cardiology

## 2013-05-25 ENCOUNTER — Encounter (HOSPITAL_COMMUNITY): Payer: Medicare Other

## 2013-05-27 ENCOUNTER — Encounter (HOSPITAL_COMMUNITY): Payer: Medicare Other

## 2013-05-27 ENCOUNTER — Encounter (HOSPITAL_COMMUNITY)
Admission: RE | Admit: 2013-05-27 | Discharge: 2013-05-27 | Disposition: A | Discharge status: Home / Self Care | Payer: Medicare Other | Source: Ambulatory Visit | Attending: Cardiology | Admitting: Cardiology

## 2013-05-30 ENCOUNTER — Encounter (HOSPITAL_COMMUNITY): Payer: Medicare Other

## 2013-05-30 ENCOUNTER — Encounter (HOSPITAL_COMMUNITY)
Admission: RE | Admit: 2013-05-30 | Discharge: 2013-05-30 | Disposition: A | Discharge status: Home / Self Care | Payer: Medicare Other | Source: Ambulatory Visit | Attending: Cardiology | Admitting: Cardiology

## 2013-06-01 ENCOUNTER — Encounter (HOSPITAL_COMMUNITY)
Admission: RE | Admit: 2013-06-01 | Discharge: 2013-06-01 | Disposition: A | Discharge status: Home / Self Care | Payer: Medicare Other | Source: Ambulatory Visit | Attending: Cardiology | Admitting: Cardiology

## 2013-06-01 ENCOUNTER — Encounter (HOSPITAL_COMMUNITY): Payer: Medicare Other

## 2013-06-03 ENCOUNTER — Encounter (HOSPITAL_COMMUNITY)
Admission: RE | Admit: 2013-06-03 | Discharge: 2013-06-03 | Disposition: A | Discharge status: Home / Self Care | Payer: Medicare Other | Source: Ambulatory Visit | Attending: Cardiology | Admitting: Cardiology

## 2013-06-03 ENCOUNTER — Encounter (HOSPITAL_COMMUNITY): Payer: Medicare Other

## 2013-06-06 ENCOUNTER — Encounter (HOSPITAL_COMMUNITY): Payer: Medicare Other

## 2013-06-06 ENCOUNTER — Encounter (HOSPITAL_COMMUNITY)
Admission: RE | Admit: 2013-06-06 | Discharge: 2013-06-06 | Disposition: A | Discharge status: Home / Self Care | Payer: Medicare Other | Source: Ambulatory Visit | Attending: Cardiology | Admitting: Cardiology

## 2013-06-06 ENCOUNTER — Ambulatory Visit (INDEPENDENT_AMBULATORY_CARE_PROVIDER_SITE_OTHER): Payer: Medicare Other | Admitting: Cardiology

## 2013-06-06 ENCOUNTER — Encounter: Payer: Self-pay | Admitting: Cardiology

## 2013-06-06 VITALS — BP 104/60 | HR 66 | Ht 70.0 in | Wt 190.0 lb

## 2013-06-06 DIAGNOSIS — I2109 ST elevation (STEMI) myocardial infarction involving other coronary artery of anterior wall: Secondary | ICD-10-CM

## 2013-06-06 DIAGNOSIS — I480 Paroxysmal atrial fibrillation: Secondary | ICD-10-CM

## 2013-06-06 DIAGNOSIS — I10 Essential (primary) hypertension: Secondary | ICD-10-CM

## 2013-06-06 DIAGNOSIS — Z9861 Coronary angioplasty status: Secondary | ICD-10-CM

## 2013-06-06 DIAGNOSIS — I519 Heart disease, unspecified: Secondary | ICD-10-CM

## 2013-06-06 DIAGNOSIS — I4891 Unspecified atrial fibrillation: Secondary | ICD-10-CM

## 2013-06-06 DIAGNOSIS — E785 Hyperlipidemia, unspecified: Secondary | ICD-10-CM

## 2013-06-06 DIAGNOSIS — I2510 Atherosclerotic heart disease of native coronary artery without angina pectoris: Secondary | ICD-10-CM

## 2013-06-06 NOTE — Progress Notes (Signed)
PATIENT: [name] MRN: [id number]  DOB: Aug 17, 1931   DOV:06/08/2013 PCP: Horton Finer, MD  Clinic Note: Chief Complaint  Patient presents with  . 3 month visit     chest pain -once in while, no sob, no edema, has 6 more days of cardiac rehab   HPI: [name] is a 80 y.o.  male with a PMH below who presents today for second post STEMI followup. I am seeing [name] first time since his hospitalization, actually since his cardiac catheterization. As you recall he is a very pleasant elderly gentleman who looks much and stated age. He has a history of an abdominal aortic aneurysm as well as dyslipidemia and hypertension. He presented on 02/10/2013 with a anterior STEMI. In route via EMS she had VF arrest requiring CPR and defibrillation. He underwent PCI of the LAD with a Promus DES. Was switched from [place] to Effient. The symptoms began while having breakfast at church prior to doing yard work. He has done well since his discharge. He has been going to cardiac rehabilitation, and has graduation in the next few weeks.  Interval History: He presents today doing wonderfully. He denies any significant cardiac findings that his chest pain/pressure or dyspnea with rest or exertion. He denies any heart failure symptoms of PND, orthopnea or edema. No rapid or regular heartbeats. No syncope or near syncope no TIA or RCA symptoms. He denies any melena, hematochezia hematuria, or nosebleeds on dual antiplatelet therapy.   He denies any claudication and is eagerly awaiting the word for him to be a return to baseline activities.  Past Medical History  Diagnosis Date  . Hypertension   . Prostate disease   . Abdominal aortic aneurysm   . ST elevation myocardial infarction (STEMI) of anterior wall, initial episode of care 02/10/2013  . CAD S/P percutaneous coronary angioplasty 02/13/2013    PROXIMAL-MID LAD 95-99% AND 80% TANDEM LESIONS - PROMUS PREMIER DES 2.75 MM X 24 MM (2.9 MM)  .  LV dysfunction, EF by echo 02/11/13 45-50% 02/13/2013  . Atrial fibrillation 02/13/2013    Peri-infarction    Prior Cardiac Evaluation and Past Surgical History: Past Surgical History  Procedure Laterality Date  . Appendectomy    . Percutaneous coronary stent intervention (pci-s)  02/10/13    PROXIMAL-MID LAD 95-99% AND 80% TANDEM LESIONS - PROMUS PREMIER DES 2.75 MM X 24 MM (2.9 MM)    Allergies  Allergen Reactions  . Ciprofloxacin Diarrhea and Nausea Only    Current Outpatient Prescriptions  Medication Sig Dispense Refill  . allopurinol (ZYLOPRIM) 100 MG tablet Take 100 mg by mouth 2 (two) times daily.      Marland Kitchen aspirin 81 MG chewable tablet Chew 1 tablet (81 mg total) by mouth daily.      Marland Kitchen atorvastatin (LIPITOR) 80 MG tablet Take 1 tablet (80 mg total) by mouth daily at 6 PM.  30 tablet  11  . CINNAMON PO Take 1,000 mg by mouth 2 (two) times daily.      . Coenzyme Q10 (Q-10 CO-ENZYME PO) Take 100 mg by mouth daily.      Marland Kitchen dutasteride (AVODART) 0.5 MG capsule Take 0.5 mg by mouth every morning.       . furosemide (LASIX) 20 MG tablet TAKE 1 TABLET BY MOUTH EVERY DAY  30 tablet  10  . Glucosamine-Chondroit-Vit C-Mn (GLUCOSAMINE 1500 COMPLEX PO) Take 1,500 mg by mouth 2 (two) times daily.      Marland Kitchen LORazepam (ATIVAN) 1  MG tablet Take 0.25 mg by mouth as needed for anxiety.       . metoprolol tartrate (LOPRESSOR) 12.5 mg TABS tablet Take 0.5 tablets (12.5 mg total) by mouth 2 (two) times daily.  30 tablet  11  . Multiple Vitamins-Minerals (ICAPS MV PO) Take 1 capsule by mouth 2 (two) times daily.      . nitroGLYCERIN (NITROSTAT) 0.4 MG SL tablet Place 1 tablet (0.4 mg total) under the tongue every 5 (five) minutes as needed for chest pain.  25 tablet  2  . prasugrel (EFFIENT) 10 MG TABS tablet Take 1 tablet (10 mg total) by mouth daily.  30 tablet  2  . primidone (MYSOLINE) 50 MG tablet Take 50 mg by mouth 2 (two) times daily.       No current facility-administered medications for this  visit.    History   Social History Narrative   He is married. Retired.   Quit smoking in 1985. Does not drink alcohol.   Currently ready to graduate from cardiac rehabilitation. Plans to sign up for followup program at the local [facility].   ROS: A comprehensive Review of Systems - Negative except Feeling a bit dried out with Lasix. Has gained weight over the holidays with increased eating.  PHYSICAL EXAM BP 104/60  Pulse 66  Ht 5\' 10"  (1.778 m)  Wt 190 lb (86.183 kg)  BMI 27.26 kg/m2 General appearance: alert, cooperative, appears stated age, no distress and Well-nourished, well-groomed. Answers questions properly. Neck: no adenopathy, no carotid bruit, no JVD, supple, symmetrical, trachea midline and thyroid not enlarged, symmetric, no tenderness/mass/nodules Lungs: clear to auscultation bilaterally, normal percussion bilaterally and Nonlabored, good air movement Heart: regular rate and rhythm, S1, S2 normal, no murmur, click, rub or gallop and normal apical impulse Abdomen: soft, non-tender; bowel sounds normal; no masses,  no organomegaly Extremities: no edema, redness or tenderness in the calves or thighs Pulses: 2+ and symmetric Neurologic: Alert and oriented X 3, normal strength and tone. Normal symmetric reflexes. Normal coordination and gait   Adult ECG Report  Rate: 66 ;  Rhythm: normal sinus rhythm with mild sinus arrhythmia  QRS Axis: 34 ;  PR Interval: 176 ;  QRS Duration: 72 ; QTc: 440 (ms)  Voltages: Normal  Conduction Disturbances: none  Other Abnormalities: none   Narrative Interpretation:  Relatively normal EKG  Recent Labs: None -- due for recheck.  ASSESSMENT / PLAN: STEMI of anterior wall - 16/10/96 Complicated by VF arrest and CPR. Found that LAD lesion treated with a DES stent. No recurrent symptoms. No adverse effects. Only mild reduction in LVEF by echo post MI. Would anticipate near complete recovery of function.  LV dysfunction, EF by echo 02/11/13  45-50% Would recheck echocardiogram in one year post MI to ensure full recovery. No signs of heart failure. Will simply make Lasix when necessary.  CAD S/P Prox-Mid LAD; Promus DES 02/10/13 DES to LAD. Switched from [place] do to dyspnea symptoms in early December. No signs of bleeding. Would consider converting to Plavix at the end of one year. Recommended that if he has any mild bleeding issues he can hold his aspirin temporarily.  HTN (hypertension)- but initially hypotensive post MI Continues to have relatively low blood pressures. Is on low-dose metoprolol only.  HLD (hyperlipidemia) On high-dose statin. We'll need lipid and chemistry check. Would help to reduce to a lower dose atorvastatin.  PAF 02/13/13; peri-infarction- NSR on AMIO No further recurrence of A. fib outside of  the MRI. He remains on low-dose beta blocker. Would not anticoagulate unless there is recurrence.    Orders Placed This Encounter  Procedures  . Lipid panel  . Hepatic function panel  . Basic metabolic panel  . EKG 12-Lead   No orders of the defined types were placed in this encounter.    Followup:  6 months  Gwenith Tschida W. Ellyn Hack, M.D., M.S. Interventional Cardiology [facility]

## 2013-06-06 NOTE — Patient Instructions (Signed)
Continue with current medication, you can take Lasix as needed.   Your physician wants you to follow-up in 6 month Dr Ellyn Hack .You will receive a reminder letter in the mail two months in advance. If you don't receive a letter, please call our office to schedule the follow-up appointment.

## 2013-06-08 ENCOUNTER — Encounter: Payer: Self-pay | Admitting: Cardiology

## 2013-06-08 ENCOUNTER — Telehealth: Payer: Self-pay | Admitting: *Deleted

## 2013-06-08 ENCOUNTER — Encounter (HOSPITAL_COMMUNITY): Payer: Medicare Other

## 2013-06-08 ENCOUNTER — Encounter (HOSPITAL_COMMUNITY)
Admission: RE | Admit: 2013-06-08 | Discharge: 2013-06-08 | Disposition: A | Discharge status: Home / Self Care | Payer: Medicare Other | Source: Ambulatory Visit | Attending: Cardiology | Admitting: Cardiology

## 2013-06-08 NOTE — Assessment & Plan Note (Signed)
On high-dose statin. We'll need lipid and chemistry check. Would help to reduce to a lower dose atorvastatin.

## 2013-06-08 NOTE — Assessment & Plan Note (Signed)
Continues to have relatively low blood pressures. Is on low-dose metoprolol only.

## 2013-06-08 NOTE — Assessment & Plan Note (Addendum)
Would recheck echocardiogram in one year post MI to ensure full recovery. No signs of heart failure. Will simply make Lasix when necessary.

## 2013-06-08 NOTE — Assessment & Plan Note (Addendum)
DES to LAD. Switched from [place] do to dyspnea symptoms in early December. No signs of bleeding. Would consider converting to Plavix at the end of one year. Recommended that if he has any mild bleeding issues he can hold his aspirin temporarily.

## 2013-06-08 NOTE — Assessment & Plan Note (Signed)
Complicated by VF arrest and CPR. Found that LAD lesion treated with a DES stent. No recurrent symptoms. No adverse effects. Only mild reduction in LVEF by echo post MI. Would anticipate near complete recovery of function.

## 2013-06-08 NOTE — Telephone Encounter (Signed)
Notified patient that a lab slip will be in the mail for bmp ,lipid, hep level- from last visit, Pt voiced understanding.

## 2013-06-08 NOTE — Telephone Encounter (Signed)
Message copied by Raiford Simmonds on Wed Jun 08, 2013  9:49 AM ------      Message from: Ellyn Hack, DAVID W      Created: Wed Jun 08, 2013  2:26 AM       Ivin Booty I forgot to order his lipids and chemistry panel during his visit. I ordered them while dictating.            The order should that for now. Please let me know if this did not work out. I did tell him personally they recheck his lipids but just forgot put it on the AVS.            HARDING,DAVID W, MD       ------

## 2013-06-08 NOTE — Assessment & Plan Note (Signed)
No further recurrence of A. fib outside of the MRI. He remains on low-dose beta blocker. Would not anticoagulate unless there is recurrence.

## 2013-06-10 ENCOUNTER — Encounter (HOSPITAL_COMMUNITY)
Admission: RE | Admit: 2013-06-10 | Discharge: 2013-06-10 | Disposition: A | Discharge status: Home / Self Care | Payer: Medicare Other | Source: Ambulatory Visit | Attending: Cardiology | Admitting: Cardiology

## 2013-06-10 ENCOUNTER — Encounter (HOSPITAL_COMMUNITY): Payer: Medicare Other

## 2013-06-13 ENCOUNTER — Encounter (HOSPITAL_COMMUNITY)
Admission: RE | Admit: 2013-06-13 | Discharge: 2013-06-13 | Disposition: A | Discharge status: Home / Self Care | Payer: Medicare Other | Source: Ambulatory Visit | Attending: Cardiology | Admitting: Cardiology

## 2013-06-13 ENCOUNTER — Encounter (HOSPITAL_COMMUNITY): Payer: Medicare Other

## 2013-06-13 ENCOUNTER — Telehealth: Payer: Self-pay | Admitting: Cardiology

## 2013-06-13 NOTE — Telephone Encounter (Signed)
Returned call and pt verified x 2.  Pt stated the mailman came early this morning and forgot one so he came back and it was the letter w/ the lab order.  RN answered pt's questions about where to get labs and if fasting needed.  Pt verbalized understanding and agreed w/ plan.

## 2013-06-13 NOTE — Telephone Encounter (Signed)
Pt stated he still have not received his lab order.

## 2013-06-14 LAB — BASIC METABOLIC PANEL
BUN: 21 mg/dL (ref 6–23)
CO2: 28 mEq/L (ref 19–32)
CREATININE: 1.16 mg/dL (ref 0.50–1.35)
Calcium: 10 mg/dL (ref 8.4–10.5)
Chloride: 102 mEq/L (ref 96–112)
Glucose, Bld: 98 mg/dL (ref 70–99)
POTASSIUM: 4.7 meq/L (ref 3.5–5.3)
Sodium: 137 mEq/L (ref 135–145)

## 2013-06-14 LAB — LIPID PANEL
Cholesterol: 93 mg/dL (ref 0–200)
HDL: 34 mg/dL — AB (ref >39)
LDL CALC: 46 mg/dL (ref 0–99)
TRIGLYCERIDES: 67 mg/dL (ref <150)
Total CHOL/HDL Ratio: 2.7 Ratio
VLDL: 13 mg/dL (ref 0–40)

## 2013-06-14 LAB — HEPATIC FUNCTION PANEL
ALBUMIN: 4.3 g/dL (ref 3.5–5.2)
ALK PHOS: 60 U/L (ref 39–117)
ALT: 36 U/L (ref 0–53)
AST: 44 U/L — ABNORMAL HIGH (ref 0–37)
Bilirubin, Direct: 0.1 mg/dL (ref 0.0–0.3)
Indirect Bilirubin: 0.3 mg/dL (ref 0.2–1.2)
TOTAL PROTEIN: 6.5 g/dL (ref 6.0–8.3)
Total Bilirubin: 0.4 mg/dL (ref 0.2–1.2)

## 2013-06-15 ENCOUNTER — Telehealth (HOSPITAL_COMMUNITY): Payer: Self-pay | Admitting: Internal Medicine

## 2013-06-15 ENCOUNTER — Encounter (HOSPITAL_COMMUNITY): Payer: Medicare Other

## 2013-06-17 ENCOUNTER — Telehealth: Payer: Self-pay | Admitting: *Deleted

## 2013-06-17 ENCOUNTER — Encounter (HOSPITAL_COMMUNITY)
Admission: RE | Admit: 2013-06-17 | Discharge: 2013-06-17 | Disposition: A | Discharge status: Home / Self Care | Payer: Medicare Other | Source: Ambulatory Visit | Attending: Cardiology | Admitting: Cardiology

## 2013-06-17 ENCOUNTER — Encounter (HOSPITAL_COMMUNITY): Payer: Medicare Other

## 2013-06-17 NOTE — Telephone Encounter (Signed)
Left message on answer machine to call back. Concerning lab results

## 2013-06-17 NOTE — Telephone Encounter (Signed)
Message copied by Raiford Simmonds on Fri Jun 17, 2013  8:24 AM ------      Message from: Leonie Man      Created: Tue Jun 14, 2013 11:55 PM       Mostly normal labs. Lipid panel is pretty good with the exception of slightly reduced HDL (good cholesterol). This can be increased really only by exercise. The other cholesterol numbers looked great.  LFTs are pretty normal as well. AST is just a bit elevated not much to worry about. Chemistries looked great. Glucose is much better and renal function is better.            We can cut back to one half dose of Lipitor that he is currently on. ------

## 2013-06-17 NOTE — Progress Notes (Signed)
[name] had a foley catheter in by his urologist.  I spoke with Darrelyn Hillock NP who said [name] had no restrictions with Exercise. [name] is wearing a leg bag. [name]'s weight was down 1.9 kg from his last exercise visit last week. Blood pressure noted 98/60. Patient asymptomatic.  Patient given 2 cups of Gatorade and water. No complaints noted during exercise. Exit blood pressure 113/73. Will continue to monitor the patient throughout  the program.

## 2013-06-20 ENCOUNTER — Encounter (HOSPITAL_COMMUNITY): Payer: Medicare Other

## 2013-06-21 MED ORDER — ATORVASTATIN CALCIUM 80 MG PO TABS: 40.0000 mg | ORAL_TABLET | Freq: Every day | ORAL | Status: DC

## 2013-06-21 NOTE — Telephone Encounter (Signed)
Spoke with wife ,infoermed her patient labs are in [place]. Any question can call back Change dose of lipitor in EPIC TO 1/2 TABLET DAILY.

## 2013-06-22 ENCOUNTER — Encounter (HOSPITAL_COMMUNITY): Payer: Medicare Other

## 2013-06-24 ENCOUNTER — Encounter (HOSPITAL_COMMUNITY): Payer: Medicare Other

## 2013-06-27 ENCOUNTER — Encounter (HOSPITAL_COMMUNITY): Payer: Medicare Other

## 2013-06-29 ENCOUNTER — Encounter (HOSPITAL_COMMUNITY): Payer: Medicare Other

## 2013-06-29 ENCOUNTER — Telehealth: Payer: Self-pay | Admitting: Cardiology

## 2013-06-29 NOTE — Telephone Encounter (Signed)
Samples x 4 weeks given effient [id number]  EXP: 11/15.

## 2013-06-29 NOTE — Telephone Encounter (Signed)
Wants to know if you have any samples of Effient 10 mg please.

## 2013-07-01 ENCOUNTER — Encounter (HOSPITAL_COMMUNITY): Payer: Medicare Other

## 2013-07-01 ENCOUNTER — Telehealth: Payer: Self-pay | Admitting: *Deleted

## 2013-07-01 ENCOUNTER — Encounter (HOSPITAL_COMMUNITY)
Admission: RE | Admit: 2013-07-01 | Discharge: 2013-07-01 | Disposition: A | Discharge status: Home / Self Care | Payer: Medicare Other | Source: Ambulatory Visit | Attending: Cardiology | Admitting: Cardiology

## 2013-07-01 DIAGNOSIS — Z5189 Encounter for other specified aftercare: Secondary | ICD-10-CM | POA: Insufficient documentation

## 2013-07-01 DIAGNOSIS — I714 Abdominal aortic aneurysm, without rupture, unspecified: Secondary | ICD-10-CM | POA: Insufficient documentation

## 2013-07-01 DIAGNOSIS — Z7982 Long term (current) use of aspirin: Secondary | ICD-10-CM | POA: Insufficient documentation

## 2013-07-01 DIAGNOSIS — I10 Essential (primary) hypertension: Secondary | ICD-10-CM | POA: Insufficient documentation

## 2013-07-01 DIAGNOSIS — E785 Hyperlipidemia, unspecified: Secondary | ICD-10-CM | POA: Insufficient documentation

## 2013-07-01 DIAGNOSIS — I739 Peripheral vascular disease, unspecified: Secondary | ICD-10-CM | POA: Insufficient documentation

## 2013-07-01 DIAGNOSIS — I4901 Ventricular fibrillation: Secondary | ICD-10-CM | POA: Insufficient documentation

## 2013-07-01 DIAGNOSIS — I2510 Atherosclerotic heart disease of native coronary artery without angina pectoris: Secondary | ICD-10-CM | POA: Insufficient documentation

## 2013-07-01 DIAGNOSIS — I4891 Unspecified atrial fibrillation: Secondary | ICD-10-CM | POA: Insufficient documentation

## 2013-07-01 DIAGNOSIS — Z87891 Personal history of nicotine dependence: Secondary | ICD-10-CM | POA: Insufficient documentation

## 2013-07-01 DIAGNOSIS — R570 Cardiogenic shock: Secondary | ICD-10-CM | POA: Insufficient documentation

## 2013-07-01 DIAGNOSIS — I2109 ST elevation (STEMI) myocardial infarction involving other coronary artery of anterior wall: Secondary | ICD-10-CM | POA: Insufficient documentation

## 2013-07-01 DIAGNOSIS — E119 Type 2 diabetes mellitus without complications: Secondary | ICD-10-CM | POA: Insufficient documentation

## 2013-07-01 DIAGNOSIS — I5041 Acute combined systolic (congestive) and diastolic (congestive) heart failure: Secondary | ICD-10-CM | POA: Insufficient documentation

## 2013-07-01 NOTE — Progress Notes (Signed)
Don graduates on Monday and plans to continue exercise at the [facility].

## 2013-07-01 NOTE — Telephone Encounter (Signed)
Spoke to patient.  RN informed patient that Dr Ellyn Hack received 06/22/13 office note from Dr Junious Silk.- urologist Per Dr Ellyn Hack - discontinue Aspirin and continue EFFIENT Patient verbalized understanding.

## 2013-07-04 ENCOUNTER — Encounter (HOSPITAL_COMMUNITY)
Admission: RE | Admit: 2013-07-04 | Discharge: 2013-07-04 | Disposition: A | Discharge status: Home / Self Care | Payer: Medicare Other | Source: Ambulatory Visit | Attending: Cardiology | Admitting: Cardiology

## 2013-07-19 ENCOUNTER — Other Ambulatory Visit: Payer: Self-pay

## 2013-07-19 MED ORDER — PRASUGREL HCL 10 MG PO TABS: 10.0000 mg | ORAL_TABLET | Freq: Every day | ORAL | Status: DC

## 2013-07-21 ENCOUNTER — Other Ambulatory Visit: Payer: Self-pay

## 2013-07-21 MED ORDER — PRASUGREL HCL 10 MG PO TABS: 10.0000 mg | ORAL_TABLET | Freq: Every day | ORAL | Status: DC

## 2013-07-21 NOTE — Telephone Encounter (Signed)
Rx was sent to pharmacy electronically. 

## 2013-08-04 ENCOUNTER — Other Ambulatory Visit: Payer: Self-pay | Admitting: Internal Medicine

## 2013-08-04 DIAGNOSIS — I714 Abdominal aortic aneurysm, without rupture, unspecified: Secondary | ICD-10-CM

## 2013-10-25 ENCOUNTER — Telehealth: Payer: Self-pay | Admitting: Cardiology

## 2013-10-25 MED ORDER — PRASUGREL HCL 10 MG PO TABS: 10.0000 mg | ORAL_TABLET | Freq: Every day | ORAL | Status: DC

## 2013-10-25 NOTE — Telephone Encounter (Signed)
[name] is calling to see if we have samples of Effient ..please call    Thanks

## 2013-10-25 NOTE — Telephone Encounter (Signed)
Patient notified that samples are at front desk.

## 2013-11-24 ENCOUNTER — Telehealth: Payer: Self-pay | Admitting: Cardiology

## 2013-11-24 MED ORDER — PRASUGREL HCL 10 MG PO TABS: 10.0000 mg | ORAL_TABLET | Freq: Every day | ORAL | Status: DC

## 2013-11-24 NOTE — Telephone Encounter (Signed)
Pt called in stating that he is almost out of his Effient and Dr. Stanford Breed mentioned to him about possibly switching him over to Plavix, so he wanted to know if he could get a sample of the Plavix to hold him over until he comes in the see the doctor. Please call  Thanks

## 2013-11-24 NOTE — Telephone Encounter (Signed)
Close encounter 

## 2013-11-24 NOTE — Telephone Encounter (Signed)
RECEIVED CLEARANCE FORM AWAITING FOR DR HARDING TO SIGN

## 2013-11-24 NOTE — Telephone Encounter (Signed)
Spoke to patient  Patient request samples of effient , until next appointment  Which is 12/05/13. RN informed patient samples are available to pick up.

## 2013-11-24 NOTE — Telephone Encounter (Signed)
She needs clarence for this pt to have a green light laser of the prostate please. She will fax over the clarence sheet.

## 2013-12-01 ENCOUNTER — Telehealth: Payer: Self-pay | Admitting: Cardiology

## 2013-12-01 NOTE — Telephone Encounter (Signed)
Left message that the patient has appointment on 12/05/13 for clearance. Call back after that date if clearance letter not recieved.

## 2013-12-01 NOTE — Telephone Encounter (Signed)
She faxed over a clarence on 11-24-13,still have not received it back. He does have an appt on 12-05-13.

## 2013-12-02 ENCOUNTER — Telehealth: Payer: Self-pay | Admitting: *Deleted

## 2013-12-02 NOTE — Telephone Encounter (Signed)
FAXED -   SIGNED FOR GREENLIGHT LASER  OF THE PROSTATE/GENERAL ANESTHESIA  PER DR HARDING  @10  MONTHS SINCE HIS STENT- IT WOULD BE IDEAL TO WAIT UNTIL [place] 2015 .  HOWEVER IF THE PROCEDURE IS URGENT,STOPPING EFFIENT  5 DAYS AT THIS POINT SHOULD BE SAFE

## 2013-12-05 ENCOUNTER — Ambulatory Visit (INDEPENDENT_AMBULATORY_CARE_PROVIDER_SITE_OTHER): Payer: Medicare Other | Admitting: Cardiology

## 2013-12-05 VITALS — BP 102/56 | HR 50 | Ht 71.0 in | Wt 176.7 lb

## 2013-12-05 DIAGNOSIS — I714 Abdominal aortic aneurysm, without rupture, unspecified: Secondary | ICD-10-CM

## 2013-12-05 DIAGNOSIS — I480 Paroxysmal atrial fibrillation: Secondary | ICD-10-CM

## 2013-12-05 DIAGNOSIS — Z01810 Encounter for preprocedural cardiovascular examination: Secondary | ICD-10-CM

## 2013-12-05 DIAGNOSIS — Z9861 Coronary angioplasty status: Secondary | ICD-10-CM

## 2013-12-05 DIAGNOSIS — E785 Hyperlipidemia, unspecified: Secondary | ICD-10-CM

## 2013-12-05 DIAGNOSIS — I2510 Atherosclerotic heart disease of native coronary artery without angina pectoris: Secondary | ICD-10-CM

## 2013-12-05 DIAGNOSIS — I519 Heart disease, unspecified: Secondary | ICD-10-CM

## 2013-12-05 DIAGNOSIS — I10 Essential (primary) hypertension: Secondary | ICD-10-CM

## 2013-12-05 DIAGNOSIS — I4891 Unspecified atrial fibrillation: Secondary | ICD-10-CM

## 2013-12-05 DIAGNOSIS — Z79899 Other long term (current) drug therapy: Secondary | ICD-10-CM

## 2013-12-05 MED ORDER — CLOPIDOGREL BISULFATE 75 MG PO TABS: 75.0000 mg | ORAL_TABLET | Freq: Every day | ORAL | Status: DC

## 2013-12-05 NOTE — Progress Notes (Addendum)
PCP: Valaria Good, MD; Dr. Isidoro Donning. Urologist: Dr. Junious Silk from [facility]  Clinic Note: Chief Complaint  Patient presents with  . Follow-up    6 Months follow-up, pt stated he's been doing great ,  will be schedule for greenlight laser prostate   HPI: OSWALD POTT is a 80 y.o. male with a PMH below who presents today for six-month followup and preoperative risk evaluation and recommendations..  Past Medical History  Diagnosis Date  . Hypertension   . Prostate disease   . Abdominal aortic aneurysm   . ST elevation myocardial infarction (STEMI) of anterior wall, initial episode of care 02/10/2013  . CAD S/P percutaneous coronary angioplasty 02/13/2013    PROXIMAL-MID LAD 95-99% AND 80% TANDEM LESIONS - PROMUS PREMIER DES 2.75 MM X 24 MM (2.9 MM)  . LV dysfunction, EF by echo 02/11/13 45-50% 02/13/2013  . Atrial fibrillation 02/13/2013    Peri-infarction   Interval History: Mr. Wanda Plump presents today for six-month followup and for preoperative evaluation for hopeful "greenlight laser surgery "on his prostate. Apparently he has been maintained within indwelling Foley catheter and leg bag for quite some time. It is becoming quite uncomfortable and also limiting his activity level. Unable to exercise like he would like to. We're being asked to evaluate it would be safe for him to stop his Effient for short-term prior to his surgery. He is almost 10 months out from his MI/CATH-PCI that was in November of last year. He has been doing great ever since his discharge. He did cardiac rehabilitation without problems and had a great time with it. He now rides his bike about 30 minutes a day most days of the week. He also mows a couple wants over the weekend. He's not had any further anginal symptoms with rest or exertion. No PND, orthopnea or edema. No exertional dyspnea. No palpitations, lightheadedness, dizziness, weakness or syncope/near syncope. No TIA/amaurosis fugax  symptoms.  ROS: A comprehensive Review of Systems - was performed Review of Systems  Constitutional: Positive for weight loss. Negative for fever, chills and malaise/fatigue.       He and his wife adopted a special diet for her diabetes and I both started walking together. He has lost 14 pounds since March  HENT: Negative for nosebleeds.   Eyes: Negative for blurred vision and double vision.  Respiratory: Negative.  Negative for cough, shortness of breath and wheezing.   Cardiovascular: Negative.  Negative for claudication.       Per history of present illness  Gastrointestinal: Negative for blood in stool and melena.  Genitourinary: Positive for hematuria. Negative for dysuria.       Currently has an indwelling Foley with leg bag. This has been there for several months.  Neurological: Positive for dizziness. Negative for tingling, sensory change, speech change, focal weakness, seizures, loss of consciousness, weakness and headaches.       Mild positional dizziness very intermittent  Endo/Heme/Allergies: Does not bruise/bleed easily.  Psychiatric/Behavioral: Negative for depression. The patient is not nervous/anxious.   All other systems reviewed and are negative.  MEDICATIONS AND ALLERGIES REVIEWED IN EPIC -- changes  Taking Lasix more when necessary basis. At present dose was reduced to 40 mg. SOCIAL AND FAMILY HISTORY REVIEWED IN EPIC -- no change PSH REVIEWED IN EPIC  Wt Readings from Last 3 Encounters:  12/05/13 176 lb 11.2 oz (80.151 kg)  06/06/13 190 lb (86.183 kg)  03/22/13 183 lb 6.8 oz (83.2 kg)   PHYSICAL EXAM BP  102/56  Pulse 50  Ht 5\' 11"  (1.803 m)  Wt 176 lb 11.2 oz (80.151 kg)  BMI 24.66 kg/m2 General appearance: Alert and oriented X 3, cooperative, appears stated age, no distress and Well-nourished, well-groomed. Answers questions properly.  Neck: Supple, no LAM, JVD or bruit  Lungs: CTAB normal percussion bilaterally and Nonlabored, good air movement  Heart:  RRR, S1, S2 normal, no murmur, click, rub or gallop and normal apical impulse  Abdomen: soft, non-tender; bowel sounds normal; no masses, no organomegaly  Extremities: no edema, redness or tenderness in the calves or thighs (Foley catheter leg bag along the right leg) Pulses: 2+ and symmetric  Neurologic: Grossly normal   Adult ECG Report  Rate: 50 ;  Rhythm: sinus bradycardia and Otherwise normal EKG Stable  Recent Labs:  None available   ASSESSMENT / PLAN: Preoperative cardiovascular examination This is a somewhat unclear situation where he is less than a year out from a drug-eluting stent. Data from [place] would suggest that at 6 months he should be okay holding dual antiplatelet therapy with a third generation DES stent. [place] data has not yet adopted this policy. By the time of surgery is performed to be close to 11 months out. At that time I think he should be close enough where he would be safe stopping the Effient. Did discuss with him that there are always potential risks of in-stent thrombosis with prematurely stopping antiplatelet agents, but I do think that he had an indwelling Foley for several months and probably would benefit from having it taken out. He really would like it done and understands the risks.  This concern notwithstanding, he now low lung has any active cardiac symptoms following revascularization. He does not have diabetes, has not had a stroke, since his MI he has not had heart failure, and his renal function is normal. He therefore does not have any active cardiac risk factors, and the surgery is low risk. He is therefore a low risk patient for low risk surgery and should be of proceed without any further evaluation.  He can stop Effient 7 days prior to the surgery, but preferred to continue taking aspirin. He will continue taking the beta blocker to the course of the surgery  CAD S/P Prox-Mid LAD; Promus DES 02/10/13 He has an LAD DES stent on Effient for now.  Actually not on aspirin. As noted above, if holding Effient, I would like you to begin taking aspirin. Following his operation, we can consider switching to Plavix as he will be per much close the first year out. He is on a statin with excellent lipid control. He is on low-dose beta blocker, but with low pulse rate and low blood pressure, I think it would be best to stop this following his operation in order to continue to have the protective effect during and post-op.  HTN (hypertension)- but initially hypotensive post MI Currently on low-dose beta blocker. I recommended his doctor this 1 month postoperatively  ( or sooner if hypotensive postop, but would continue perioperative)  Hyperlipidemia with target LDL less than 70 We reduce his dose of statin age of 23 last on his own. At the 40 mg of atorvastatin his last lipids in March were excellent. We will recheck within an MR panel following his next visit in 6 months  PAF 02/13/13; peri-infarction- NSR on AMIO No recurrent episodes.  LV dysfunction, EF by echo 02/11/13 45-50% No heart failure symptoms. The plan was to check an echocardiogram when  he is one year out from his MI. I will hold on checking until I see him back in followup in order to not overload and would evaluations with his upcoming operation.  AAA (abdominal aortic aneurysm)- 4.3 cm by [place] 11/14 On six-month surveillance with Dr. Maxwell Caul   Patient instructions:  STOP EFFIENT DURING THE PROCEDURE AND TAKE ASPIRIN 81 MG WHILE OFF EFFIENT IF OKAY WITH UROLOGIST   STOP  METOPROLOL ONE MONTH AFTER PROSTATE SURGERY   AFTER SURGERY STOP EFFIENT  AND START Clopidogrel (PLAVIX)  ONE WEEK AFTER STARTING CLOPIDOGREL - HAVE BLOODWORK (P2Y12)  DONE AT [facility] (GO TO MAIN ENTRANCE FOR REGISTRATION)  Orders Placed This Encounter  Procedures  . Platelet inhibition p2y12  . EKG 12-Lead    Followup: 6 months   HARDING,DAVID W, M.D., M.S. Interventional Cardiologist    Pager # [phone]

## 2013-12-05 NOTE — Patient Instructions (Signed)
STOP EFFIENT DURING THE PROCEDURE AND TAKE ASPIRIN 81 MG WHILE OFF EFFIENT IF OKAY WITH UROLOGIST  STOP  METOPROLOL ONE MONTH AFTER PROSTATE SURGERY  AFTER SURGERY STOP EFFIENT  AND START Clopidogrel (PLAVIX)  ONE WEEK AFTER STARTING CLOPIDOGREL - HAVE BLOODWORK (P2Y12)  DONE AT [place] (GO TO MAIN ENTRANCE FOR REGISTRATION)  Your physician wants you to follow-up in 6 MONTH Dr Ellyn Hack.  You will receive a reminder letter in the mail two months in advance. If you don't receive a letter, please call our office to schedule the follow-up appointment.

## 2013-12-06 ENCOUNTER — Encounter: Payer: Self-pay | Admitting: Cardiology

## 2013-12-06 DIAGNOSIS — Z01810 Encounter for preprocedural cardiovascular examination: Secondary | ICD-10-CM | POA: Insufficient documentation

## 2013-12-06 NOTE — Assessment & Plan Note (Signed)
We reduce his dose of statin age of 30 last on his own. At the 40 mg of atorvastatin his last lipids in March were excellent. We will recheck within an MR panel following his next visit in 6 months

## 2013-12-06 NOTE — Assessment & Plan Note (Signed)
He has an LAD DES stent on Effient for now. Actually not on aspirin. As noted above, if holding Effient, I would like you to begin taking aspirin. Following his operation, we can consider switching to Plavix as he will be per much close the first year out. He is on a statin with excellent lipid control. He is on low-dose beta blocker, but with low pulse rate and low blood pressure, I think it would be best to stop this following his operation in order to continue to have the protective effect during and post-op.

## 2013-12-06 NOTE — Assessment & Plan Note (Signed)
Currently on low-dose beta blocker. I recommended his doctor this 1 month postoperatively  ( or sooner if hypotensive postop, but would continue perioperative)

## 2013-12-06 NOTE — Assessment & Plan Note (Signed)
No heart failure symptoms. The plan was to check an echocardiogram when he is one year out from his MI. I will hold on checking until I see him back in followup in order to not overload and would evaluations with his upcoming operation.

## 2013-12-06 NOTE — Assessment & Plan Note (Signed)
No recurrent episodes. 

## 2013-12-06 NOTE — Assessment & Plan Note (Signed)
On six-month surveillance with Dr. Maxwell Caul

## 2013-12-06 NOTE — Assessment & Plan Note (Signed)
This is a somewhat unclear situation where he is less than a year out from a drug-eluting stent. Data from [place] would suggest that at 6 months he should be okay holding dual antiplatelet therapy with a third generation DES stent. [place] data has not yet adopted this policy. By the time of surgery is performed to be close to 11 months out. At that time I think he should be close enough where he would be safe stopping the Effient. Did discuss with him that there are always potential risks of in-stent thrombosis with prematurely stopping antiplatelet agents, but I do think that he had an indwelling Foley for several months and probably would benefit from having it taken out. He really would like it done and understands the risks.  This concern notwithstanding, he now low lung has any active cardiac symptoms following revascularization. He does not have diabetes, has not had a stroke, since his MI he has not had heart failure, and his renal function is normal. He therefore does not have any active cardiac risk factors, and the surgery is low risk. He is therefore a low risk patient for low risk surgery and should be of proceed without any further evaluation.  He can stop Effient 7 days prior to the surgery, but preferred to continue taking aspirin. He will continue taking the beta blocker to the course of the surgery

## 2013-12-08 ENCOUNTER — Other Ambulatory Visit: Payer: Self-pay | Admitting: Urology

## 2013-12-22 ENCOUNTER — Ambulatory Visit
Admission: RE | Admit: 2013-12-22 | Discharge: 2013-12-22 | Disposition: A | Discharge status: Home / Self Care | Payer: Medicare Other | Source: Ambulatory Visit | Attending: Internal Medicine | Admitting: Internal Medicine

## 2013-12-22 DIAGNOSIS — I714 Abdominal aortic aneurysm, without rupture, unspecified: Secondary | ICD-10-CM

## 2014-01-06 ENCOUNTER — Other Ambulatory Visit: Payer: Self-pay

## 2014-01-09 ENCOUNTER — Telehealth: Payer: Self-pay | Admitting: Cardiology

## 2014-01-09 NOTE — Telephone Encounter (Signed)
Returned call to patient he stated he is scheduled for prostate surgery 02/03/14.Stated he will be stopping effient in 21 days and starting plavix after surgery.Stated he is out of effient and needs samples.Effient 10 mg samples left at [employer] office front desk.Also stated he broke a tooth off and will need to have tooth pulled.Stated he wanted to ask Dr.Harding how long he needs to hold effient before having pulled.

## 2014-01-09 NOTE — Telephone Encounter (Signed)
Would be best if he can have the tooth pulled while he is already off of Effient pre-op. For tooth pull ~5 days off should be fine.  Vannia Pola W c

## 2014-01-09 NOTE — Telephone Encounter (Signed)
Returned call to patient Dr.Harding advised would be best if you can have tooth pulled while off effient.Advised if unable then ok to hold 5 days prior to extraction.

## 2014-01-09 NOTE — Telephone Encounter (Signed)
[name] has a couple of questions. Has a prostate surgery coming up in November and is going to be out of his effient and will be off of the effient for about 10days. Would like some samples . Also may have to have a tooth pull and wants to know if he can go of his blood thinner and wants to know how many days he has to stay off of the blood thinner  Please call    Thanks

## 2014-01-27 ENCOUNTER — Encounter (HOSPITAL_COMMUNITY)
Admission: RE | Admit: 2014-01-27 | Discharge: 2014-01-27 | Disposition: A | Discharge status: Home / Self Care | Payer: Medicare Other | Source: Ambulatory Visit | Attending: Urology | Admitting: Urology

## 2014-01-27 ENCOUNTER — Ambulatory Visit (HOSPITAL_COMMUNITY)
Admission: RE | Admit: 2014-01-27 | Discharge: 2014-01-27 | Disposition: A | Discharge status: Home / Self Care | Payer: Medicare Other | Source: Ambulatory Visit | Attending: Anesthesiology | Admitting: Anesthesiology

## 2014-01-27 ENCOUNTER — Encounter (HOSPITAL_COMMUNITY): Payer: Self-pay

## 2014-01-27 DIAGNOSIS — Z01818 Encounter for other preprocedural examination: Secondary | ICD-10-CM | POA: Insufficient documentation

## 2014-01-27 DIAGNOSIS — I700 Atherosclerosis of aorta: Secondary | ICD-10-CM | POA: Insufficient documentation

## 2014-01-27 DIAGNOSIS — I10 Essential (primary) hypertension: Secondary | ICD-10-CM

## 2014-01-27 DIAGNOSIS — I252 Old myocardial infarction: Secondary | ICD-10-CM | POA: Insufficient documentation

## 2014-01-27 HISTORY — DX: Tremor, unspecified: R25.1

## 2014-01-27 HISTORY — DX: Unspecified osteoarthritis, unspecified site: M19.90

## 2014-01-27 HISTORY — DX: Scar conditions and fibrosis of skin: L90.5

## 2014-01-27 HISTORY — DX: Unspecified macular degeneration: H35.30

## 2014-01-27 LAB — CBC
HEMATOCRIT: 45.5 % (ref 39.0–52.0)
HEMOGLOBIN: 14.8 g/dL (ref 13.0–17.0)
MCH: 30.1 pg (ref 26.0–34.0)
MCHC: 32.5 g/dL (ref 30.0–36.0)
MCV: 92.7 fL (ref 78.0–100.0)
Platelets: 147 10*3/uL — ABNORMAL LOW (ref 150–400)
RBC: 4.91 MIL/uL (ref 4.22–5.81)
RDW: 14.1 % (ref 11.5–15.5)
WBC: 6.7 10*3/uL (ref 4.0–10.5)

## 2014-01-27 LAB — BASIC METABOLIC PANEL
Anion gap: 10 (ref 5–15)
BUN: 21 mg/dL (ref 6–23)
CALCIUM: 10.3 mg/dL (ref 8.4–10.5)
CO2: 27 mEq/L (ref 19–32)
CREATININE: 1.14 mg/dL (ref 0.50–1.35)
Chloride: 104 mEq/L (ref 96–112)
GFR, EST AFRICAN AMERICAN: 68 mL/min — AB (ref >90)
GFR, EST NON AFRICAN AMERICAN: 59 mL/min — AB (ref >90)
GLUCOSE: 83 mg/dL (ref 70–99)
Potassium: 4.9 mEq/L (ref 3.7–5.3)
Sodium: 141 mEq/L (ref 137–147)

## 2014-01-27 NOTE — Patient Instructions (Addendum)
[place]  01/27/2014   Your procedure is scheduled on: Friday 02/03/14  Report to [facility] at 07:00 AM.  Call this number if you have problems the morning of surgery [phone]   Remember:   Do not eat food or drink liquids After Midnight.     Take these medicines the morning of surgery with A SIP OF WATER: lopressor, primidone, nasal spray   Do not wear jewelry, make-up or nail polish.  Do not wear lotions, powders, or perfumes.   Do not shave 48 hours prior to surgery. Men may shave face and neck.  Do not bring valuables to the hospital.  Contacts, dentures or bridgework may not be worn into surgery.  Leave suitcase in the car. After surgery it may be brought to your room.   [place] - Preparing for Surgery Before surgery, you can play an important role.  Because skin is not sterile, your skin needs to be as free of germs as possible.  You can reduce the number of germs on your skin by washing with CHG (chlorahexidine gluconate) soap before surgery.  CHG is an antiseptic cleaner which kills germs and bonds with the skin to continue killing germs even after washing. Please DO NOT use if you have an allergy to CHG or antibacterial soaps.  If your skin becomes reddened/irritated stop using the CHG and inform your nurse when you arrive at Short Stay. Do not shave (including legs and underarms) for at least 48 hours prior to the first CHG shower.  You may shave your face/neck. Please follow these instructions carefully:  1.  Shower with CHG Soap the night before surgery and the  morning of Surgery.  2.  If you choose to wash your hair, wash your hair first as usual with your  normal  shampoo.  3.  After you shampoo, rinse your hair and body thoroughly to remove the  shampoo.                            4.  Use CHG as you would any other liquid soap.  You can apply chg directly  to the skin and wash                       Gently with a scrungie or clean  washcloth.  5.  Apply the CHG Soap to your body ONLY FROM THE NECK DOWN.   Do not use on face/ open                           Wound or open sores. Avoid contact with eyes, ears mouth and genitals (private parts).                       Wash face,  Genitals (private parts) with your normal soap.             6.  Wash thoroughly, paying special attention to the area where your surgery  will be performed.  7.  Thoroughly rinse your body with warm water from the neck down.  8.  DO NOT shower/wash with your normal soap after using and rinsing off  the CHG Soap.                9.  Pat yourself dry with a clean towel.  10.  Wear clean pajamas.            11.  Place clean sheets on your bed the night of your first shower and do not  sleep with pets. Day of Surgery : Do not apply any lotions/deodorants the morning of surgery.  Please wear clean clothes to the hospital/surgery center.  FAILURE TO FOLLOW THESE INSTRUCTIONS MAY RESULT IN THE CANCELLATION OF YOUR SURGERY PATIENT SIGNATURE_________________________________  NURSE SIGNATURE__________________________________  ________________________________________________________________________

## 2014-01-27 NOTE — Progress Notes (Signed)
CXR results done 01/27/2014 sent via EPIC to Dr Junious Silk.

## 2014-01-27 NOTE — Progress Notes (Signed)
   01/27/14 1025  OBSTRUCTIVE SLEEP APNEA  Have you ever been diagnosed with sleep apnea through a sleep study? No  Do you snore loudly (loud enough to be heard through closed doors)?  1  Do you often feel tired, fatigued, or sleepy during the daytime? 0  Has anyone observed you stop breathing during your sleep? 0  Do you have, or are you being treated for high blood pressure? 0  BMI more than 35 kg/m2? 0  Age over 78 years old? 1  Neck circumference greater than 40 cm/16 inches? 1  Gender: 1  Obstructive Sleep Apnea Score 4  Score 4 or greater  Results sent to PCP

## 2014-01-27 NOTE — Progress Notes (Signed)
EKG 12/05/13 on EPIC

## 2014-02-02 NOTE — H&P (Signed)
History of Present Illness    F/u -    1-BPH - Aug 2013 - on Avodart and tamsulosin. No dysuria or hematuria. He voids with a weak stream.  -Aug 2014 weak stream, off tamsulosin, but added back. On Avodart. Nl DRE.    2-Urinary retention - Mar 2015 - presented with gross hematuria and urinary retention x 2L. Foley placed. Nov 2014 - MI and PTCA/stent - on Effient.  -Apr 2015 - foley removed; pt voided with weak stream, aymptomatic. Foley replaced and drained about 900 ml.  -Sept 2015 - urodynamics showed a normal capacity, hypotonic bladder.   3-Gross hematuria - Mar 2015 - presented with gross hematuria and urinary retention x 2L. Foley placed. Mar 24th BUN 21, creatinine 1.16 - on EPIC. Nov 2014 - MI and PTCA/DES - on Effient. -April 2015 - cystoscopy - median lobe, inflamed bladder wall. CT hematuria negative-severely thickened bladder wall, median lobe. I reviewed all the images. Subtle enhancement of the left renal pelvis, mild dilation of the right ureter.   4-PCa screening -  -Aug 2013 - PSA 0.85.    He is on allopurinol for Gout.     Nov 2015 interval hx   He returns for preop appointment. His greenlight photo vaporization is scheduled for November 13. I'll send urine for culture today. He's been well without chest pain or shortness of breath.    Past Medical History Problems  1. History of hypercholesterolemia (Z86.39)  Surgical History Problems  1. History of Inguinal Hernia Repair  Current Meds 1. Allopurinol 100 MG Oral Tablet;  Therapy: [id number] to Recorded 2. Atorvastatin Calcium 80 MG Oral Tablet;  Therapy: 40JWJ1914 to Recorded 3. Avodart 0.5 MG Oral Capsule; Take one capsule daily  Requested for: 11Aug2014; Last  Rx:11Aug2014 Ordered 4. Cinnamon CAPS;  Therapy: (Recorded:03Feb2011) to Recorded 5. Co Q 10 CAPS;  Therapy: (Recorded:11Aug2014) to Recorded 6. Effient 10 MG Oral Tablet;  Therapy: 78GNF6213 to Recorded 7. Furosemide 20 MG Oral  Tablet; as needed only per pt;  Therapy: (Recorded:22Apr2015) to Recorded 8. Glucosamine Chondr 1500 Complx CAPS;  Therapy: (Recorded:25Mar2015) to Recorded 9. LORazepam TABS;  Therapy: (Recorded:25Mar2015) to Recorded 10. Metoprolol Tartrate 25 MG Oral Tablet;   Therapy: 08MVH8469 to Recorded 11. Multi-Vitamin TABS;   Therapy: (Recorded:03Feb2011) to Recorded 12. Nitrofurantoin Monohyd Macro 100 MG Oral Capsule; TAKE 1 CAPSULE TWICE DAILY   WITH MEALS;   Therapy: 62XBM8413 to (Evaluate:05Aug2015)  Requested for: 24MWN0272; Last   Rx:31Jul2015 Ordered 13. Nitrostat 0.4 MG Sublingual Tablet Sublingual;   Therapy: 53GUY4034 to Recorded 14. Oxybutynin Chloride 5 MG Oral Tablet; TAKE 1 TABLET Twice daily PRN;   Therapy: [id number] to (Evaluate:18Jul2015)  Requested for: [phone]; Last   Rx:18Jun2015 Ordered 15. Primidone 50 MG Oral Tablet;   Therapy: 18ACZ6606 to Recorded 16. Sulfamethoxazole-TMP DS 800-160 MG TABS; Take 1 tablet twice daily;   Therapy: 30ZSW1093 to (Evaluate:18Sep2015)  Requested for: 08Sep2015; Last   Rx:08Sep2015 Ordered 17. Tamsulosin HCl - 0.4 MG Oral Capsule; TAKE 1 CAPSULE Daily;   Therapy: 03Sep2015 to (Evaluate:28Aug2016)  Requested for: 03Sep2015; Last   Rx:03Sep2015 Ordered 18. Triamcinolone Acetonide 0.1 % External Cream; APPLY A THIN LAYER TO AFFECTED   AREA(S) TWICE DAILY;   Therapy: 23FTD3220 to (Evaluate:29May2015)  Requested for: 25KYH0623; Last   JS:[id number] Ordered  Allergies Medication  1. Cipro TABS  Family History Problems  1. Family history of Death of family member : Mother, Father 2. Family history of Family Health Status Number Of Children   2 sons  Social History Problems  1. Alcohol Use   2/day 2. Caffeine Use   1/day 3. Family history of Death In The Family Father   deceased age 77 4. Former smoker [phone])   quit 28 years ago 5. Occupation:   Biomedical engineer 6. Tobacco Use   quit 25 yrs ago; smoked 35yrs  2pk/day  Vitals Vital Signs [Data Includes: Last 1 Day]  Recorded: 18ACZ6606 10:36AM  Blood Pressure: 104 / 53 Temperature: 97.5 F Heart Rate: 61  Physical Exam Constitutional: Well nourished and well developed . No acute distress.  Pulmonary: No respiratory distress and normal respiratory rhythm and effort.  Cardiovascular: Heart rate and rhythm are normal . No peripheral edema.  Neuro/Psych:. Mood and affect are appropriate.    Procedure Patient slowly was removed. He was prepped and draped in a new 16 French Foley placed without difficulty.     Assessment Assessed  1. Benign prostatic hyperplasia with urinary obstruction (N40.1)  Plan Benign prostatic hyperplasia with urinary obstruction  1. Follow-up Keep Future Appt Office  Follow-up  Status: Hold For - Appointment  Requested  for: 30ZSW1093 2. Cath, simple, wIinsert Temp Cath; Status:Complete;   Done: 23FTD3220 3. UA MICROSCOPIC; [Do Not Release]; Status:Hold For - Specimen/Data  Collection,Appointment; Requested for:04Nov2015;  4. URINE CULTURE; Status:Hold For - Specimen/Data Collection,Appointment; Requested  [phone];   Discussion/Summary BPH, retention - plan GreenLight PVP and retrograde pyelograms during procedure to ensure normal collecting systems given the prior CT findings. Possible enhancement of left renal pelvis, possible dilation right ureter.    gross hematuria - resolved. Complete eval as above.       Signatures Electronically signed by : Festus Aloe, M.D.; Jan 25 2014 10:46AM EST  Add: Urine cx grew 9K mixed growth. I started NF.

## 2014-02-03 ENCOUNTER — Encounter (HOSPITAL_COMMUNITY): Payer: Self-pay | Admitting: *Deleted

## 2014-02-03 ENCOUNTER — Ambulatory Visit (HOSPITAL_COMMUNITY): Payer: Medicare Other | Admitting: Anesthesiology

## 2014-02-03 ENCOUNTER — Ambulatory Visit (HOSPITAL_COMMUNITY)
Admission: RE | Admit: 2014-02-03 | Discharge: 2014-02-03 | Disposition: A | Discharge status: Home / Self Care | Payer: Medicare Other | Source: Ambulatory Visit | Attending: Urology | Admitting: Urology

## 2014-02-03 ENCOUNTER — Encounter (HOSPITAL_COMMUNITY)
Admission: RE | Disposition: A | Discharge status: Home / Self Care | Payer: Self-pay | Source: Ambulatory Visit | Attending: Urology

## 2014-02-03 DIAGNOSIS — M109 Gout, unspecified: Secondary | ICD-10-CM | POA: Insufficient documentation

## 2014-02-03 DIAGNOSIS — Z87891 Personal history of nicotine dependence: Secondary | ICD-10-CM | POA: Insufficient documentation

## 2014-02-03 DIAGNOSIS — N401 Enlarged prostate with lower urinary tract symptoms: Secondary | ICD-10-CM | POA: Insufficient documentation

## 2014-02-03 DIAGNOSIS — Z79899 Other long term (current) drug therapy: Secondary | ICD-10-CM | POA: Diagnosis not present

## 2014-02-03 DIAGNOSIS — R339 Retention of urine, unspecified: Secondary | ICD-10-CM | POA: Diagnosis not present

## 2014-02-03 DIAGNOSIS — R310 Gross hematuria: Secondary | ICD-10-CM | POA: Diagnosis not present

## 2014-02-03 DIAGNOSIS — I252 Old myocardial infarction: Secondary | ICD-10-CM | POA: Insufficient documentation

## 2014-02-03 DIAGNOSIS — I2510 Atherosclerotic heart disease of native coronary artery without angina pectoris: Secondary | ICD-10-CM | POA: Diagnosis not present

## 2014-02-03 HISTORY — PX: GREEN LIGHT LASER TURP (TRANSURETHRAL RESECTION OF PROSTATE: SHX6260

## 2014-02-03 HISTORY — PX: CYSTOSCOPY W/ RETROGRADES: SHX1426

## 2014-02-03 SURGERY — GREEN LIGHT LASER TURP (TRANSURETHRAL RESECTION OF PROSTATE
Anesthesia: General

## 2014-02-03 MED ORDER — FENTANYL CITRATE 0.05 MG/ML IJ SOLN
25.0000 ug | INTRAMUSCULAR | Status: DC | PRN
  Administered 2014-02-03 (×2): 25 ug via INTRAVENOUS

## 2014-02-03 MED ORDER — PROPOFOL 10 MG/ML IV BOLUS
INTRAVENOUS | Status: DC | PRN
  Administered 2014-02-03: 130 mg via INTRAVENOUS
  Administered 2014-02-03: 40 mg via INTRAVENOUS

## 2014-02-03 MED ORDER — LACTATED RINGERS IV SOLN
INTRAVENOUS | Status: DC
  Administered 2014-02-03: 11:00:00 via INTRAVENOUS
  Administered 2014-02-03: 1000 mL via INTRAVENOUS

## 2014-02-03 MED ORDER — SODIUM CHLORIDE 0.9 % IV SOLN
10.0000 mg | INTRAVENOUS | Status: DC | PRN
  Administered 2014-02-03: 25 ug/min via INTRAVENOUS

## 2014-02-03 MED ORDER — ONDANSETRON HCL 4 MG/2ML IJ SOLN
INTRAMUSCULAR | Status: DC | PRN
  Administered 2014-02-03: 4 mg via INTRAVENOUS

## 2014-02-03 MED ORDER — CEFAZOLIN SODIUM-DEXTROSE 2-3 GM-% IV SOLR
INTRAVENOUS | Status: AC
  Filled 2014-02-03: qty 50

## 2014-02-03 MED ORDER — FENTANYL CITRATE 0.05 MG/ML IJ SOLN
INTRAMUSCULAR | Status: DC | PRN
  Administered 2014-02-03: 25 ug via INTRAVENOUS

## 2014-02-03 MED ORDER — LIDOCAINE HCL (CARDIAC) 20 MG/ML IV SOLN
INTRAVENOUS | Status: AC
  Filled 2014-02-03: qty 5

## 2014-02-03 MED ORDER — ACETAMINOPHEN 325 MG PO TABS
ORAL_TABLET | ORAL | Status: AC
  Filled 2014-02-03: qty 2

## 2014-02-03 MED ORDER — PHENYLEPHRINE HCL 10 MG/ML IJ SOLN
INTRAMUSCULAR | Status: DC | PRN
  Administered 2014-02-03: 80 ug via INTRAVENOUS
  Administered 2014-02-03: 120 ug via INTRAVENOUS

## 2014-02-03 MED ORDER — CLOPIDOGREL BISULFATE 75 MG PO TABS: 75.0000 mg | ORAL_TABLET | Freq: Every day | ORAL | Status: DC

## 2014-02-03 MED ORDER — FENTANYL CITRATE 0.05 MG/ML IJ SOLN
INTRAMUSCULAR | Status: AC
  Filled 2014-02-03: qty 2

## 2014-02-03 MED ORDER — DEXAMETHASONE SODIUM PHOSPHATE 10 MG/ML IJ SOLN
INTRAMUSCULAR | Status: DC | PRN
  Administered 2014-02-03: 10 mg via INTRAVENOUS

## 2014-02-03 MED ORDER — PRASUGREL HCL 10 MG PO TABS: 10.0000 mg | ORAL_TABLET | Freq: Every day | ORAL | Status: DC

## 2014-02-03 MED ORDER — IOHEXOL 300 MG/ML  SOLN
INTRAMUSCULAR | Status: DC | PRN
Start: 2014-02-03 — End: 2014-02-03
  Administered 2014-02-03: 6 mL

## 2014-02-03 MED ORDER — ACETAMINOPHEN 325 MG PO TABS
650.0000 mg | ORAL_TABLET | Freq: Once | ORAL | Status: AC
  Administered 2014-02-03: 650 mg via ORAL

## 2014-02-03 MED ORDER — PROPOFOL 10 MG/ML IV BOLUS
INTRAVENOUS | Status: AC
  Filled 2014-02-03: qty 20

## 2014-02-03 MED ORDER — CEFAZOLIN SODIUM-DEXTROSE 2-3 GM-% IV SOLR
2.0000 g | INTRAVENOUS | Status: AC
  Administered 2014-02-03: 2 g via INTRAVENOUS

## 2014-02-03 MED ORDER — SODIUM CHLORIDE 0.9 % IR SOLN
Status: DC | PRN
  Administered 2014-02-03: 6000 mL via INTRAVESICAL

## 2014-02-03 MED ORDER — LIDOCAINE HCL (CARDIAC) 20 MG/ML IV SOLN
INTRAVENOUS | Status: DC | PRN
  Administered 2014-02-03: 100 mg via INTRAVENOUS

## 2014-02-03 SURGICAL SUPPLY — 22 items
BAG URINE DRAINAGE (UROLOGICAL SUPPLIES) ×3 IMPLANT
BAG URO CATCHER STRL LF (DRAPE) ×3 IMPLANT
CATH INTERMIT  6FR 70CM (CATHETERS) ×1 IMPLANT
CATH TIEMANN FOLEY 18FR 5CC (CATHETERS) ×1 IMPLANT
CATH URET 5FR 28IN CONE TIP (BALLOONS) ×1
CATH URET 5FR 70CM CONE TIP (BALLOONS) IMPLANT
DRAPE CAMERA CLOSED 9X96 (DRAPES) ×3 IMPLANT
ELECT BUTTON HF 24-28F 2 30DE (ELECTRODE) IMPLANT
ELECT LOOP MED HF 24F 12D (CUTTING LOOP) IMPLANT
ELECT LOOP MED HF 24F 12D CBL (CLIP) IMPLANT
ELECT RESECT VAPORIZE 12D CBL (ELECTRODE) IMPLANT
FEE RENTAL LASER GREENLIGHT (Laser) ×2 IMPLANT
GLOVE BIOGEL M STRL SZ7.5 (GLOVE) ×3 IMPLANT
GOWN STRL REUS W/TWL XL LVL3 (GOWN DISPOSABLE) ×4 IMPLANT
HOLDER FOLEY CATH W/STRAP (MISCELLANEOUS) IMPLANT
LASER FIBER /GREENLIGHT LASER (Laser) ×3 IMPLANT
LASER GREENLIGHT RENTAL P/PROC (Laser) ×3 IMPLANT
MANIFOLD NEPTUNE II (INSTRUMENTS) ×3 IMPLANT
PACK CYSTO (CUSTOM PROCEDURE TRAY) ×3 IMPLANT
SYR 30ML LL (SYRINGE) IMPLANT
SYRINGE IRR TOOMEY STRL 70CC (SYRINGE) IMPLANT
TUBING CONNECTING 10 (TUBING) ×3 IMPLANT

## 2014-02-03 NOTE — Anesthesia Preprocedure Evaluation (Addendum)
Anesthesia Evaluation  Patient identified by MRN, date of birth, ID band Patient awake    Reviewed: Allergy & Precautions, H&P , NPO status , Patient's Chart, lab work & pertinent test results  Airway Mallampati: II  TM Distance: >3 FB Neck ROM: Full    Dental no notable dental hx.    Pulmonary former smoker,  breath sounds clear to auscultation  Pulmonary exam normal       Cardiovascular hypertension, Pt. on medications and Pt. on home beta blockers + CAD, + Past MI and + Peripheral Vascular Disease + dysrhythmias Atrial Fibrillation Rhythm:Regular Rate:Normal  LV EF 45-50% by ECHO 02-11-13 H/O A.Fib, PCI 02-13-13  No current cardiopulmonary symptoms.  AAA 4.3 cm   Neuro/Psych negative neurological ROS  negative psych ROS   GI/Hepatic negative GI ROS, Neg liver ROS,   Endo/Other  negative endocrine ROS  Renal/GU negative Renal ROS  negative genitourinary   Musculoskeletal  (+) Arthritis -,   Abdominal   Peds negative pediatric ROS (+)  Hematology negative hematology ROS (+)   Anesthesia Other Findings   Reproductive/Obstetrics negative OB ROS                            Anesthesia Physical Anesthesia Plan  ASA: III  Anesthesia Plan: General   Post-op Pain Management:    Induction: Intravenous  Airway Management Planned: LMA  Additional Equipment:   Intra-op Plan:   Post-operative Plan: Extubation in OR  Informed Consent: I have reviewed the patients History and Physical, chart, labs and discussed the procedure including the risks, benefits and alternatives for the proposed anesthesia with the patient or authorized representative who has indicated his/her understanding and acceptance.   Dental advisory given  Plan Discussed with: CRNA  Anesthesia Plan Comments:         Anesthesia Quick Evaluation

## 2014-02-03 NOTE — Op Note (Signed)
Preoperative diagnosis: Gross hematuria, BPH, urinary retention Postoperative diagnosis: Same  Procedure: Exam under anesthesia Cystoscopy with bilateral retrograde pyelogram Greenlight photo vaporization of the prostate  Surgeon: Junious Silk  Anesthesia: [place]ennen[place]  Anesthesia: Gen.  Indication for procedure: [name] 80 year old or presented with urinary retention and gross hematuria after Foley was placed. CT hematuria protocol revealed a significantly thickened bladder wall, large median lobe of the prostate, J hooking mild dilation of the right distal ureter, some mild enhancement of the left renal pelvis but no mass. He failed multiple voiding trials. He was brought today for greenlight photo selective vaporization of the prostate and bilateral retrograde pyelogram. Retrograde evaluation of the distal right ureter and the left collecting system was the goal.   Findings: On exam under anesthesia the penis was uncircumcised but normal without mass or lesion. There was no phimosis. The testicles were descended bilaterally and palpably normal. On digital rectal exam the prostate was mildly enlarged but smooth without hard area or nodule.  On cystoscopy the urethra appeared normal. The prostatic urethra was obstructed from a large median lobe. The lateral lobes were short and about 90% occlusive. The bladder appeared normal there were no worrisome findings. There was some typical posterior bullous edema from the Foley, otherwise the mucosa appeared normal and there were no tumors. There was severe trabeculation. There were no stones or foreign bodies in the bladder. The trigone ureteral orifices appear normal. There was clear efflux.  Left retrograde pyelogram outlined a single ureter single collecting system unit without filling defect, stricture or dilation.was some mild J hooking and dilation of the distal ureter but no dilation of the renal pelvis or collecting system. There was brisk  drainage.  Right retrograde pyelogram outlined a single right ureter with some mild dilation of the distal ureter slight narrowing of the mid ureter around the iliacs and then a curve and narrowing in the proximal ureter without proximal dilation which is evident on the CT.   The syringe required refilling to complete the retrograde but the fluoroscopy system malfunction and would not work. It was not able to be remedied. I reviewed the CT scan in the collecting system and renal pelvis were adequately evaluated on the prior CT hematuria protocol therefore I did not feel the patient needed to be kept asleep to repair the table and continue fluoroscopy.I was able to get one final image before completing the retrograde which showed brisk drainage of the ureter.  Description of procedure: After consent was obtained patient brought to the operating room. After adequate anesthesia he was placed in lithotomy position and prepped and draped in the usual sterile fashion. An exam under anesthesia was performed. A timeout was performed to confirm the patient and procedure. The cystoscope was passed per urethra and the bladder carefully examined. A cone tip catheter but she is to cannulate each ureteral orifice starting with the left and retrograde injection of contrast was performed. the laser was then deployed and I made incisions at 5:00 and 7:00 down to the bladder neck and capsule. These incisions were carried down to the veru. I made hockey-stick incisions at the apex to identified. From this point forward vaporization and lasering was not carried out rocks more distal to these initial incisions. The median lobe was then vaporized which created a wide-open channel. There was some anterior tissue on the lateral lobes and these were vaporized bladder neck to veru and veru to bladder neck in a systematic fashion. The majority of laser energy  deployed to the median lobe.ureteral orifices were  Repeatedly checked and not  injured. The bladder was drained and under low pressure there was an excellent channel and excellent hemostasis. The bladder was filled and the scope removed. An 51 French coude catheter was placed in the balloon inflated to 15 mL and seated at the bladder neck. His left gravity drainage draining clear urine. The patient was awakened taken to recovery room in stable condition.  Complications: None  Blood loss: Minimal  Drains: 18 French Foley  Specimens: None  Disposition: Patient stable to PACU

## 2014-02-03 NOTE — Discharge Instructions (Signed)
Foley Catheter Care °A Foley catheter is a soft, flexible tube that is placed into the bladder to drain urine. A Foley catheter may be inserted if: °· You leak urine or are not able to control when you urinate (urinary incontinence). °· You are not able to urinate when you need to (urinary retention). °· You had prostate surgery or surgery on the genitals. °· You have certain medical conditions, such as multiple sclerosis, dementia, or a spinal cord injury. °If you are going home with a Foley catheter in place, follow the instructions below. °TAKING CARE OF THE CATHETER °1. Wash your hands with soap and water. °2. Using mild soap and warm water on a clean washcloth: °· Clean the area on your body closest to the catheter insertion site using a circular motion, moving away from the catheter. Never wipe toward the catheter because this could sweep bacteria up into the urethra and cause infection. °· Remove all traces of soap. Pat the area dry with a clean towel. For males, reposition the foreskin. °3. Attach the catheter to your leg so there is no tension on the catheter. Use adhesive tape or a leg strap. If you are using adhesive tape, remove any sticky residue left behind by the previous tape you used. °4. Keep the drainage bag below the level of the bladder, but keep it off the floor. °5. Check throughout the day to be sure the catheter is working and urine is draining freely. Make sure the tubing does not become kinked. °6. Do not pull on the catheter or try to remove it. Pulling could damage internal tissues. °TAKING CARE OF THE DRAINAGE BAGS °You will be given two drainage bags to take home. One is a large overnight drainage bag, and the other is a smaller leg bag that fits underneath clothing. You may wear the overnight bag at any time, but you should never wear the smaller leg bag at night. Follow the instructions below for how to empty, change, and clean your drainage bags. °Emptying the Drainage Bag °You must  empty your drainage bag when it is  -½ full or at least 2-3 times a day. °1. Wash your hands with soap and water. °2. Keep the drainage bag below your hips, below the level of your bladder. This stops urine from going back into the tubing and into your bladder. °3. Hold the dirty bag over the toilet or a clean container. °4. Open the pour spout at the bottom of the bag and empty the urine into the toilet or container. Do not let the pour spout touch the toilet, container, or any other surface. Doing so can place bacteria on the bag, which can cause an infection. °5. Clean the pour spout with a gauze pad or cotton ball that has rubbing alcohol on it. °6. Close the pour spout. °7. Attach the bag to your leg with adhesive tape or a leg strap. °8. Wash your hands well. °Changing the Drainage Bag °Change your drainage bag once a month or sooner if it starts to smell bad or look dirty. Below are steps to follow when changing the drainage bag. °1. Wash your hands with soap and water. °2. Pinch off the rubber catheter so that urine does not spill out. °3. Disconnect the catheter tube from the drainage tube at the connection valve. Do not let the tubes touch any surface. °4. Clean the end of the catheter tube with an alcohol wipe. Use a different alcohol wipe to clean the   end of the drainage tube. 5. Connect the catheter tube to the drainage tube of the clean drainage bag. 6. Attach the new bag to the leg with adhesive tape or a leg strap. Avoid attaching the new bag too tightly. 7. Wash your hands well. Cleaning the Drainage Bag 1. Wash your hands with soap and water. 2. Wash the bag in warm, soapy water. 3. Rinse the bag thoroughly with warm water. 4. Fill the bag with a solution of white vinegar and water (1 cup vinegar to 1 qt warm water [.2 L vinegar to 1 L warm water]). Close the bag and soak it for 30 minutes in the solution. 5. Rinse the bag with warm water. 6. Hang the bag to dry with the pour spout open  and hanging downward. 7. Store the clean bag (once it is dry) in a clean plastic bag. 8. Wash your hands well. PREVENTING INFECTION  Wash your hands before and after handling your catheter.  Take showers daily and wash the area where the catheter enters your body. Do not take baths. Replace wet leg straps with dry ones, if this applies.  Do not use powders, sprays, or lotions on the genital area. Only use creams, lotions, or ointments as directed by your caregiver.  For females, wipe from front to back after each bowel movement.  Drink enough fluids to keep your urine clear or pale yellow unless you have a fluid restriction.  Do not let the drainage bag or tubing touch or lie on the floor.  Wear cotton underwear to absorb moisture and to keep your skin drier. SEEK MEDICAL CARE IF:   Your urine is cloudy or smells unusually bad.  Your catheter becomes clogged.  You are not draining urine into the bag or your bladder feels full.  Your catheter starts to leak. SEEK IMMEDIATE MEDICAL CARE IF:   You have pain, swelling, redness, or pus where the catheter enters the body.  You have pain in the abdomen, legs, lower back, or bladder.  You have a fever.  You see blood fill the catheter, or your urine is pink or red.  You have nausea, vomiting, or chills.  Your catheter gets pulled out. MAKE SURE YOU:   Understand these instructions.  Will watch your condition.  Will get help right away if you are not doing well or get worse. Document Released: 03/10/2005 Document Revised: 07/25/2013 Document Reviewed: 03/01/2012 [facility] Patient Information 2015 [place], [place]. This information is not intended to replace advice given to you by your health care provider. Make sure you discuss any questions you have with your health care provider. Prostate Laser Surgery, Care After Refer to this sheet in the next few weeks. These instructions provide you with information on caring for yourself  after your procedure. Your health care provider may also give you more specific instructions. Your treatment has been planned according to current medical practices, but problems sometimes occur. Call your health care provider if you have any problems or questions after your procedure. WHAT TO EXPECT AFTER THE PROCEDURE  You may notice blood in your urine that could last up to 3 weeks.  After your catheter is removed, you will have burning (especially at the tip of your penis) when you urinate, especially at the end of urination. For the first few weeks after your procedure, you will feel the need to urinate often. HOME CARE INSTRUCTIONS  7. Do not perform vigorous exercise, especially heavy lifting, for 1 week or as  as directed by your health care provider. °8. Avoid sexual activity for 4-6 weeks or as directed by your health care provider. °9. Do not ride in a car for extended periods for 1 month or as directed by your health care provider. °10. Do not strain to have a bowel movement. Drink a lot of fluids and and make sure you get enough fiber in your diet. °11. Drink enough fluids to keep your urine clear or pale yellow. °SEEK IMMEDIATE MEDICAL CARE IF:  °9. Your catheter has been removed and you are suddenly unable to urinate. °10. Your catheter has not been removed, and it develops a blockage. °11. You start to have blood clots in your urine. °12. The blood in your urine becomes persistent or gets thick. °13. Your temperature is greater than 100.5°F (38.1°C). °14. You develop chest pains. °15. You develop shortness of breath. °16. You develop leg swelling or pain. °MAKE SURE YOU: °8. Understand these instructions. °9. Will watch your condition. °10. Will get help right away if you are not doing well or get worse. °Document Released: 03/10/2005 Document Revised: 03/15/2013 Document Reviewed: 08/30/2012 °Exit[employer]® Patient Information ©2015 [employer]. This information is not intended to replace  advice given to you by your health care provider. Make sure you discuss any questions you have with your health care provider. ° ° ° °

## 2014-02-03 NOTE — Interval H&P Note (Signed)
History and Physical Interval Note:  02/03/2014 8:59 AM  [name]  has presented today for surgery, with the diagnosis of BPH GROSS HEMATURA  The various methods of treatment have been discussed with the patient and family. After consideration of risks, benefits and other options for treatment, the patient has consented to  Procedure(s): GREEN LIGHT LASER TURP (TRANSURETHRAL RESECTION OF PROSTATE PHOTO VAPORIZATION (N/A)  BILATERAL RETROGRADE PYELOGRAM (Bilateral) as a surgical intervention .  The patient's history has been reviewed, patient examined, no change in status, stable for surgery.  I have reviewed the patient's chart and labs.  Questions were answered to the patient's satisfaction.     Festus Aloe

## 2014-02-03 NOTE — Anesthesia Postprocedure Evaluation (Signed)
  Anesthesia Post-op Note  Patient: [name]  Procedure(s) Performed: Procedure(s) (LRB): GREEN LIGHT LASER TURP (TRANSURETHRAL RESECTION OF PROSTATE PHOTO VAPORIZATION (N/A)  BILATERAL RETROGRADE PYELOGRAM (Bilateral)  Patient Location: PACU  Anesthesia Type: General  Level of Consciousness: awake and alert   Airway and Oxygen Therapy: Patient Spontanous Breathing  Post-op Pain: mild  Post-op Assessment: Post-op Vital signs reviewed, Patient's Cardiovascular Status Stable, Respiratory Function Stable, Patent Airway and No signs of Nausea or vomiting  Last Vitals:  Filed Vitals:   02/03/14 1254  BP: 103/45  Pulse: 95  Temp:   Resp: 16    Post-op Vital Signs: stable   Complications: No apparent anesthesia complications

## 2014-02-03 NOTE — Transfer of Care (Signed)
Immediate Anesthesia Transfer of Care Note  Patient: [name] November  Procedure(s) Performed: Procedure(s): GREEN LIGHT LASER TURP (TRANSURETHRAL RESECTION OF PROSTATE PHOTO VAPORIZATION (N/A)  BILATERAL RETROGRADE PYELOGRAM (Bilateral)  Patient Location: PACU  Anesthesia Type:General  Level of Consciousness: awake, alert  and oriented  Airway & Oxygen Therapy: Patient Spontanous Breathing and Patient connected to face mask oxygen  Post-op Assessment: Report given to PACU RN and Post -op Vital signs reviewed and stable  Post vital signs: Reviewed  Complications: No apparent anesthesia complications

## 2014-02-06 ENCOUNTER — Encounter (HOSPITAL_COMMUNITY): Payer: Self-pay | Admitting: Urology

## 2014-02-13 ENCOUNTER — Ambulatory Visit (HOSPITAL_COMMUNITY)
Admission: RE | Admit: 2014-02-13 | Discharge: 2014-02-13 | Disposition: A | Discharge status: Home / Self Care | Payer: Medicare Other | Source: Ambulatory Visit | Attending: Cardiology | Admitting: Cardiology

## 2014-02-13 ENCOUNTER — Telehealth: Payer: Self-pay | Admitting: Cardiology

## 2014-02-13 DIAGNOSIS — I2109 ST elevation (STEMI) myocardial infarction involving other coronary artery of anterior wall: Secondary | ICD-10-CM

## 2014-02-13 LAB — PLATELET INHIBITION P2Y12: PLATELET FUNCTION P2Y12: 9 [PRU] — AB (ref 194–418)

## 2014-02-13 NOTE — Telephone Encounter (Signed)
Spoke with lab, p2y12 order found.

## 2014-02-14 ENCOUNTER — Telehealth: Payer: Self-pay | Admitting: *Deleted

## 2014-02-14 NOTE — Telephone Encounter (Signed)
-----   Message from Leonie Man, MD sent at 02/13/2014  5:44 PM EST ----- Plavix seems to be pretty potent.  Stop aspirin. May need to recheck this value in ~23months  Leonie Man, MD

## 2014-02-14 NOTE — Telephone Encounter (Signed)
Left message to call back In regards to lab results

## 2014-02-14 NOTE — Telephone Encounter (Signed)
Spoke to patient. Result given . Verbalized understanding AWARE TO STOP ASPIRIN

## 2014-02-17 ENCOUNTER — Other Ambulatory Visit: Payer: Self-pay | Admitting: Cardiology

## 2014-03-02 ENCOUNTER — Encounter (HOSPITAL_COMMUNITY): Payer: Self-pay | Admitting: Cardiology

## 2014-03-07 ENCOUNTER — Telehealth: Payer: Self-pay | Admitting: Cardiology

## 2014-03-07 NOTE — Telephone Encounter (Signed)
Returned call to patient he stated he picked up plavix prescription from pharmacy.Stated he noticed when he got home Dr.Eskridge's name on bottle his urologist.Stated he had right medication just wrong Dr on bottle.Advised to let pharmacist know.

## 2014-03-07 NOTE — Telephone Encounter (Signed)
Please call,when he picked up his prescription it had the wrong doctor on it.

## 2014-03-21 ENCOUNTER — Other Ambulatory Visit: Payer: Self-pay | Admitting: Dermatology

## 2014-03-28 ENCOUNTER — Telehealth: Payer: Self-pay | Admitting: Cardiology

## 2014-03-28 NOTE — Telephone Encounter (Signed)
Paged re tachycardia and lightheadedness.   Pt had TURP in Nov. Today saw urologist and was having urinary retention. Foley placed and left in. Immediately 600cc UOP after insertion. Has been having 200-300cc UOP every couple of hours since.   At 6pm, took flomax, hadn't taken for 3 weeks. It was restarted at previous dose.   At 9pm, noticed HR up to 120s-130s, felt lightheaded. SBP 118-122. Otherwise feels okay. No changes in eating, drinking. BP meds recently stopped due to low BP  Impression Orthostasis in setting of post obstructive diuresis and re-start of alpha blocker  Plan Discontinue flomax for the time being Encouraged him to hydrate tonight He will call his urologist in the AM to discuss flomax and UOP.  All questions answered. Patient in agreement with plan.

## 2014-04-01 ENCOUNTER — Other Ambulatory Visit: Payer: Self-pay | Admitting: Cardiology

## 2014-04-03 NOTE — Telephone Encounter (Signed)
Rx refill sent to patient pharmacy   

## 2014-04-24 ENCOUNTER — Telehealth: Payer: Self-pay | Admitting: Cardiology

## 2014-04-24 NOTE — Telephone Encounter (Signed)
Pt called in wanting to know if it was ok for him to take Motrin, he Urologist Dr. Junious Silk was inquiring. Please call back  Thanks

## 2014-04-24 NOTE — Telephone Encounter (Signed)
Returned call to patient - wife answered. Communicated that it is OK to take Motrin with other medications - per pharmacist, Erasmo Downer. She voiced understanding and will communicate this to patient

## 2014-04-27 ENCOUNTER — Other Ambulatory Visit: Payer: Self-pay | Admitting: Internal Medicine

## 2014-04-27 DIAGNOSIS — R1032 Left lower quadrant pain: Secondary | ICD-10-CM

## 2014-04-28 ENCOUNTER — Ambulatory Visit
Admission: RE | Admit: 2014-04-28 | Discharge: 2014-04-28 | Disposition: A | Discharge status: Home / Self Care | Payer: Medicare Other | Source: Ambulatory Visit | Attending: Internal Medicine | Admitting: Internal Medicine

## 2014-04-28 DIAGNOSIS — R1032 Left lower quadrant pain: Secondary | ICD-10-CM

## 2014-04-28 MED ORDER — IOHEXOL 300 MG/ML  SOLN
100.0000 mL | Freq: Once | INTRAMUSCULAR | Status: AC | PRN
  Administered 2014-04-28: 100 mL via INTRAVENOUS

## 2014-05-02 ENCOUNTER — Other Ambulatory Visit: Payer: Self-pay

## 2014-05-15 ENCOUNTER — Encounter: Payer: Self-pay | Admitting: Vascular Surgery

## 2014-05-16 ENCOUNTER — Ambulatory Visit (INDEPENDENT_AMBULATORY_CARE_PROVIDER_SITE_OTHER): Payer: Medicare Other | Admitting: Vascular Surgery

## 2014-05-16 ENCOUNTER — Encounter: Payer: Self-pay | Admitting: Vascular Surgery

## 2014-05-16 VITALS — BP 116/60 | HR 85 | Ht 71.0 in | Wt 182.6 lb

## 2014-05-16 DIAGNOSIS — I714 Abdominal aortic aneurysm, without rupture, unspecified: Secondary | ICD-10-CM

## 2014-05-16 NOTE — Progress Notes (Addendum)
HISTORY AND PHYSICAL     CC:  AAA Referring Provider:  Dorian Heckle, MD  HPI: This is a 81 y.o. male who has been followed by physicians at [place] for an abdominal aortic aneurysm that he has known about for ~ 10 years.  He states when it was found, it measured ~3.8cm.  He states that he had his yearly ultrasound on 12/22/13 and the diameter at that time was 4.3cm.    He states that he has been having some pain in his left groin where he had a hernia repair ~ 10 years ago.  He states that he has been having more pain in this area for the past 6 months.  He was sent for a CT scan on 04/28/14.  He states that he received a telephone call and it was said that he had a "4.5cm nodule" in the left groin and he needed to f/u with vascular.  He later learned it was meant he had a 4.5cm AAA, which he was already aware of and has been followed for this.  He denies any abdominal or back pain.   In November of 2014, he did have 10/10 chest pain at [facility] in [place].  He was transported to [facility] and in route developed VF arrest.  He received CPR and 2 shocks.  He was taken to the cath and underwent placement of a DES to the LAD.  At that time, he had an EF of 45%.   This past November 2015, he underwent a Greenlight photo vaporization of the prostate for BPH.  He states that he has done well.  He does not have a catheter now, but does have to I&O himself every morning and evening for 2 months.    He does have a remote hx of tobacco use, but quit 30 years ago.  He states that he no longer takes Effient and is now taking only Plavix.  He is on a statin for his cholesterol.  He is no longer taking a beta blocker.  Past Medical History  Diagnosis Date  . Prostate disease   . ST elevation myocardial infarction (STEMI) of anterior wall, initial episode of care 02/10/2013  . CAD S/P percutaneous coronary angioplasty 02/13/2013    PROXIMAL-MID LAD 95-99% AND 80% TANDEM LESIONS - PROMUS PREMIER DES 2.75  MM X 24 MM (2.9 MM)  . LV dysfunction, EF by echo 02/11/13 45-50% 02/13/2013  . Atrial fibrillation 02/13/2013    Peri-infarction  . Arthritis   . Abdominal aortic aneurysm     being monitored-"checked in Oct. and it stayed the same"  . Tremors of nervous system     both hands-under control  . Macular degeneration of left eye   . 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 Percutaneous coronary stent intervention (pci-s)  02/10/13    PROXIMAL-MID LAD 95-99% AND 80% TANDEM LESIONS - PROMUS PREMIER DES 2.75 MM X 24 MM (2.9 MM)  . Appendectomy  age 68   . Hernia repair Left 2000  . Green light laser turp (transurethral resection of prostate N/A 02/03/2014    Procedure: GREEN LIGHT LASER TURP (TRANSURETHRAL RESECTION OF PROSTATE PHOTO VAPORIZATION;  Surgeon: Festus Aloe, MD;  Location: [facility]S;  Service: Urology;  Laterality: N/A;  . Cystoscopy w/ retrogrades Bilateral 02/03/2014    Procedure:  BILATERAL RETROGRADE PYELOGRAM;  Surgeon: Festus Aloe, MD;  Location: [facility];  Service: Urology;  Laterality: Bilateral;  . Left heart catheterization with coronary angiogram N/A 02/10/2013    Procedure: LEFT HEART CATHETERIZATION WITH CORONARY ANGIOGRAM;  Surgeon: Leonie Man, MD;  Location: [facility] CATH LAB;  Service: Cardiovascular;  Laterality: N/A;    Allergies  Allergen Reactions  . Ciprofloxacin Diarrhea and Nausea Only    Current Outpatient Prescriptions  Medication Sig Dispense Refill  . allopurinol (ZYLOPRIM) 100 MG tablet Take 100 mg by mouth daily as needed (gout flare up).     Marland Kitchen atorvastatin (LIPITOR) 80 MG tablet TAKE 1 TABLET BY MOUTH EVERY DAY AT 6PM 30 tablet 2  . CINNAMON PO Take 1,000 mg by mouth 2 (two) times daily.    . clopidogrel (PLAVIX) 75 MG tablet Take 1 tablet (75 mg total) by mouth daily. 30 tablet 11  . Coenzyme Q10 (Q-10 CO-ENZYME PO) Take 100 mg by mouth every morning.     . furosemide (LASIX) 20 MG  tablet TAKE 1 TABLET BY MOUTH EVERY DAY as needed    . furosemide (LASIX) 20 MG tablet Take 20 mg by mouth daily as needed for fluid or edema.    . Glucosamine-Chondroit-Vit C-Mn (GLUCOSAMINE 1500 COMPLEX PO) Take 1,500 mg by mouth 2 (two) times daily.    Marland Kitchen LORazepam (ATIVAN) 1 MG tablet Take 0.5 mg by mouth daily as needed for anxiety.     Marland Kitchen loteprednol (ALREX) 0.2 % SUSP Place 1-2 drops into the left eye daily as needed (dryness in eye after injection.).    Marland Kitchen Multiple Vitamins-Minerals (ICAPS MV PO) Take 1 capsule by mouth 2 (two) times daily.    . Multiple Vitamins-Minerals (PRESERVISION AREDS 2 PO) Take 1 tablet by mouth 2 (two) times daily.    Marland Kitchen PRESCRIPTION MEDICATION 1 each every 6 (six) weeks. Eye Injection in Left eye.    . primidone (MYSOLINE) 50 MG tablet Take 100 mg by mouth every morning.     . sodium chloride (OCEAN) 0.65 % SOLN nasal spray Place 1 spray into both nostrils every 12 (twelve) hours as needed for congestion.    . sulfamethoxazole-trimethoprim (BACTRIM DS) 800-160 MG per tablet Take 1 tablet by mouth 2 (two) times daily. On hold until 3 days before surgery    . tamsulosin (FLOMAX) 0.4 MG CAPS capsule Take 1 capsule by mouth at bedtime.     . nitroGLYCERIN (NITROSTAT) 0.4 MG SL tablet Place 1 tablet (0.4 mg total) under the tongue every 5 (five) minutes as needed for chest pain. 25 tablet 2   No current facility-administered medications for this visit.    No family history on file.  History   Social History  . Marital Status: Married    Spouse Name: N/A  . Number of Children: N/A  . Years of Education: N/A   Occupational History  . Not on file.   Social History Main Topics  . Smoking status: Former Smoker -- 1.00 packs/day for 10 years    Types: Cigarettes    Quit date: 06/07/1983  . Smokeless tobacco: Never Used  . Alcohol Use: 0.0 oz/week    0 Standard drinks or equivalent per week     Comment: social  . Drug Use: No  . Sexual Activity: Not on file     Other Topics Concern  . Not on file   Social History Narrative   He is married. Retired.   Quit smoking in 1985. Does not drink alcohol.   Currently ready to graduate from cardiac rehabilitation. Plans to  sign up for followup program at the local [employer].     ROS: [x]  Positive   []  Negative   [ ]  All sytems reviewed and are negative  Cardiovascular: []  chest pain/pressure []  palpitations []  SOB lying flat []  DOE []  pain in legs while walking []  pain in feet when lying flat []  hx of DVT []  hx of phlebitis []  swelling in legs []  varicose veins  Pulmonary: []  productive cough []  asthma []  wheezing  Neurologic: []  weakness in []  arms []  legs []  numbness in []  arms []  legs [] difficulty speaking or slurred speech []  temporary loss of vision in one eye []  dizziness  Hematologic: []  bleeding problems []  problems with blood clotting easily  GI []  vomiting blood []  blood in stool  GU: []  burning with urination []  blood in urine [x]  BPH with recent surgery  Psychiatric: []  hx of major depression  Integumentary: []  rashes []  ulcers  Constitutional: []  fever []  chills   PHYSICAL EXAMINATION:  Filed Vitals:   05/16/14 1442  BP: 116/60  Pulse: 85   Body mass index is 25.48 kg/(m^2).  General:  WDWN in NAD Gait: Normal HENT: WNL, normocephalic Pulmonary: normal non-labored breathing , without Rales, rhonchi,  wheezing Cardiac: RRR, without  Murmurs, rubs or gallops; without carotid bruits Abdomen: soft, NT, no masses Skin: without rashes, without ulcers  Vascular Exam/Pulses:  Right Left  Radial 2+ (normal) 2+ (normal)  DP 2+ (normal) 2+ (normal)  PT 2+ (normal) 2+ (normal)   Extremities: without ischemic changes, without Gangrene , without cellulitis; without open wounds;  Musculoskeletal: no muscle wasting or atrophy  Neurologic: A&O X 3; Appropriate Affect ; SENSATION: normal; MOTOR FUNCTION:  moving all extremities equally. Speech is  fluent/normal   Non-Invasive Vascular Imaging:   CT scan 04/28/14: MPRESSION: 1. No acute or inflammatory findings identified in the abdomen or pelvis. 2. Mild bilateral inguinal ligament soft tissue thickening likely postoperative in nature. Significantly improved appearance of the bladder since 2015. 3. Chronic infrarenal abdominal aortic aneurysm with slight progression since 2015, maximum diameter 45 mm. Recommend followup by abdomen and pelvis CTA in 6 months, and vascular surgery referral/consultation if not already obtained   Pt meds includes: Statin:  Yes.   Beta Blocker:  No Aspirin:  No. ACEI:  No. ARB:  No. Other Antiplatelet/Anticoagulant:  Yes.   Plavix   ASSESSMENT/PLAN:: 81 y.o. male with known AAA.   -pt has known hx of AAA for ~ 10 years.  It measured 4.3cm by ultrasound in October 2015 and now measures 4.5cm by CT scan February 2016.   -he is asymptomatic from this AAA -he has been under yearly surveillance for this, but will now need 6 month follow up with ultrasound.  He can have this done at our office or at Dr. Kathline Magic office, which ever he prefers as long as it is being monitored. -he needs to maintain strict blood pressure control -there are no restrictions on lifting -he is off his Effient and now taking Plavix for his DES -signs and symptoms have been discussed with the pt and he knows to call 911 if he has these symptoms.   Leontine Locket, PA-C [facility] [phone]  Clinic MD:  Pt seen and examined in conjunction with Dr. Donnetta Hutching  I have examined the patient, reviewed and agree with above. Discussed his moderate size aneurysm with the patient and his wife present. Explained no restrictions for activity. Will see him in 6 months for follow-up. Discussed  symptoms of aneurysm he knows to report immediately to the emergency room should  EARLY, TODD, MD 05/16/2014 4:04 PM

## 2014-05-17 NOTE — Addendum Note (Signed)
Addended by: Mena Goes on: 05/17/2014 12:03 PM   Modules accepted: Orders

## 2014-06-08 ENCOUNTER — Encounter: Payer: Self-pay | Admitting: Cardiology

## 2014-06-08 ENCOUNTER — Ambulatory Visit (INDEPENDENT_AMBULATORY_CARE_PROVIDER_SITE_OTHER): Payer: Medicare Other | Admitting: Cardiology

## 2014-06-08 VITALS — BP 110/62 | HR 66 | Ht 70.5 in | Wt 181.5 lb

## 2014-06-08 DIAGNOSIS — Z9861 Coronary angioplasty status: Secondary | ICD-10-CM

## 2014-06-08 DIAGNOSIS — I2510 Atherosclerotic heart disease of native coronary artery without angina pectoris: Secondary | ICD-10-CM

## 2014-06-08 DIAGNOSIS — R030 Elevated blood-pressure reading, without diagnosis of hypertension: Secondary | ICD-10-CM

## 2014-06-08 DIAGNOSIS — Z8679 Personal history of other diseases of the circulatory system: Secondary | ICD-10-CM

## 2014-06-08 DIAGNOSIS — Z01810 Encounter for preprocedural cardiovascular examination: Secondary | ICD-10-CM

## 2014-06-08 DIAGNOSIS — I714 Abdominal aortic aneurysm, without rupture, unspecified: Secondary | ICD-10-CM

## 2014-06-08 DIAGNOSIS — Z79899 Other long term (current) drug therapy: Secondary | ICD-10-CM

## 2014-06-08 DIAGNOSIS — I10 Essential (primary) hypertension: Secondary | ICD-10-CM

## 2014-06-08 DIAGNOSIS — I519 Heart disease, unspecified: Secondary | ICD-10-CM

## 2014-06-08 DIAGNOSIS — I480 Paroxysmal atrial fibrillation: Secondary | ICD-10-CM

## 2014-06-08 DIAGNOSIS — I2109 ST elevation (STEMI) myocardial infarction involving other coronary artery of anterior wall: Secondary | ICD-10-CM

## 2014-06-08 DIAGNOSIS — E785 Hyperlipidemia, unspecified: Secondary | ICD-10-CM

## 2014-06-08 MED ORDER — NITROGLYCERIN 0.4 MG SL SUBL: 0.4000 mg | SUBLINGUAL_TABLET | SUBLINGUAL | Status: DC | PRN

## 2014-06-08 MED ORDER — ATORVASTATIN CALCIUM 40 MG PO TABS: 40.0000 mg | ORAL_TABLET | Freq: Every day | ORAL | Status: DC

## 2014-06-08 NOTE — Patient Instructions (Addendum)
PLEASE GO TO [place] TO REGISTER FOR LAB.--P2Y12 LABS - CMP, NMR WITH LIPIDS  NO CHANGE IN MEDICATIONS  Your physician wants you to follow-up in 23 MONTHS Dr Ellyn Hack. You will receive a reminder letter in the mail two months in advance. If you don't receive a letter, please call our office to schedule the follow-up appointment.

## 2014-06-10 ENCOUNTER — Encounter: Payer: Self-pay | Admitting: Cardiology

## 2014-06-10 DIAGNOSIS — Z7901 Long term (current) use of anticoagulants: Secondary | ICD-10-CM | POA: Insufficient documentation

## 2014-06-10 DIAGNOSIS — Z79899 Other long term (current) drug therapy: Secondary | ICD-10-CM | POA: Insufficient documentation

## 2014-06-10 NOTE — Progress Notes (Addendum)
What I  PCP: Dorian Heckle, MD  Clinic Note: Chief Complaint  Patient presents with  . 6 MONTH VISIT    NO CHEST PAIN , NO SOB, NO EDEMA  . Coronary Artery Disease    History of anterior STEMI November  2014   HPI: [name] is a 81 y.o. male with a PMH below who presents today for six-month followup of his CAD - history of anterior stenting in November 2014 with tandem LAD lesions treated with a progress. The accident as noted below.. I last saw him for preoperative assessment for greenlight laser prostate surgery -- he tolerated that well. He has had a relatively uneventful followup since his MI. He did have some peri-infarct atrial fibrillation but has not had any recurrence - he is therefore not on anticoagulation be on Plavix.  Past Medical History  Diagnosis Date  . Prostate disease   . ST elevation myocardial infarction (STEMI) of anterior wall, initial episode of care 02/10/2013  . CAD S/P percutaneous coronary angioplasty 02/13/2013    PROXIMAL-MID LAD 95-99% AND 80% TANDEM LESIONS - PROMUS PREMIER DES 2.75 MM X 24 MM (2.9 MM)  . LV dysfunction, EF by echo 02/11/13 45-50% 02/13/2013  . Atrial fibrillation 02/13/2013    Peri-infarction  . Arthritis   . Abdominal aortic aneurysm     being monitored-"checked in Oct. and it stayed the same"  . Tremors of nervous system     both hands-under control  . Macular degeneration of left eye   . 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 Percutaneous coronary stent intervention (pci-s)  02/10/13    PROXIMAL-MID LAD 95-99% AND 80% TANDEM LESIONS - PROMUS PREMIER DES 2.75 MM X 24 MM (2.9 MM)  . Appendectomy  age 17   . Hernia repair Left 2000  . Green light laser turp (transurethral resection of prostate N/A 02/03/2014    Procedure: GREEN LIGHT LASER TURP (TRANSURETHRAL RESECTION OF PROSTATE PHOTO VAPORIZATION;  Surgeon:  Festus Aloe, MD;  Location: [facility];  Service: Urology;  Laterality: N/A;  . Cystoscopy w/ retrogrades Bilateral 02/03/2014    Procedure:  BILATERAL RETROGRADE PYELOGRAM;  Surgeon: Festus Aloe, MD;  Location: [facility];  Service: Urology;  Laterality: Bilateral;  . Left heart catheterization with coronary angiogram N/A 02/10/2013    Procedure: LEFT HEART CATHETERIZATION WITH CORONARY ANGIOGRAM;  Surgeon: Leonie Man, MD;  Location: [facility] CATH LAB;  Service: Cardiovascular;  Laterality: N/A;   Interval History: Tomi presents today without any major complaints cardiac standpoint. He continues to rise quite for about 30 minutes most days a week and also goes for walks on the weekend. He does stretching conditioning exercises as well.  He denies any chest pain or shortness of breath with rest or exertion. No PND, orthopnea or edema. No palpitations, lightheadedness, dizziness, weakness or syncope/near syncope.he is only taking his furosemide on an as-needed basis. Very infrequently. Partially because he is having issues with urinary system related to his prostate CA. No TIA/amaurosis fugax symptoms.  ROS: A comprehensive was performed. Review of Systems  Constitutional: Negative for malaise/fatigue.  HENT: Negative for nosebleeds.   Respiratory: Negative for cough, shortness of breath and wheezing.   Cardiovascular: Negative for claudication.  Gastrointestinal: Negative for blood in stool and melena.  Genitourinary: Negative for hematuria.  Musculoskeletal: Positive for joint pain. Negative for myalgias.  Neurological:  Positive for dizziness (Occasional orthostatic dizziness). Negative for headaches.  Endo/Heme/Allergies: Bruises/bleeds easily.  Psychiatric/Behavioral: Negative.   All other systems reviewed and are negative.   Current Outpatient Prescriptions on File Prior to Visit  Medication Sig Dispense Refill  . allopurinol (ZYLOPRIM) 100 MG tablet Take 100 mg by mouth daily as  needed (gout flare up).     . CINNAMON PO Take 1,000 mg by mouth 2 (two) times daily.    . clopidogrel (PLAVIX) 75 MG tablet Take 1 tablet (75 mg total) by mouth daily. 30 tablet 11  . Coenzyme Q10 (Q-10 CO-ENZYME PO) Take 100 mg by mouth every morning.     . furosemide (LASIX) 20 MG tablet TAKE 1 TABLET BY MOUTH EVERY DAY as needed    . furosemide (LASIX) 20 MG tablet Take 20 mg by mouth daily as needed for fluid or edema.    . Glucosamine-Chondroit-Vit C-Mn (GLUCOSAMINE 1500 COMPLEX PO) Take 1,500 mg by mouth 2 (two) times daily.    Marland Kitchen LORazepam (ATIVAN) 1 MG tablet Take 0.5 mg by mouth daily as needed for anxiety.     Marland Kitchen loteprednol (ALREX) 0.2 % SUSP Place 1-2 drops into the left eye daily as needed (dryness in eye after injection.).    Marland Kitchen Multiple Vitamins-Minerals (ICAPS MV PO) Take 1 capsule by mouth 2 (two) times daily.    . primidone (MYSOLINE) 50 MG tablet Take 100 mg by mouth every morning.     . sodium chloride (OCEAN) 0.65 % SOLN nasal spray Place 1 spray into both nostrils every 12 (twelve) hours as needed for congestion.     No current facility-administered medications on file prior to visit.   Allergies  Allergen Reactions  . Ciprofloxacin Diarrhea and Nausea Only    History  Substance Use Topics  . Smoking status: Former Smoker -- 1.00 packs/day for 10 years    Types: Cigarettes    Quit date: 06/07/1983  . Smokeless tobacco: Never Used  . Alcohol Use: 0.0 oz/week    0 Standard drinks or equivalent per week     Comment: social   History reviewed. No pertinent family history.  Wt Readings from Last 3 Encounters:  06/08/14 181 lb 8 oz (82.328 kg)  05/16/14 182 lb 9.6 oz (82.827 kg)  02/03/14 179 lb (81.194 kg)   PHYSICAL EXAM BP 110/62 mmHg  Pulse 66  Ht 5' 10.5" (1.791 m)  Wt 181 lb 8 oz (82.328 kg)  BMI 25.67 kg/m2 General appearance: alert, cooperative, appears stated age, no distress and Well-nourished, well-groomed. Answers questions properly. Pleasant mood  and affect Neck: no adenopathy, no carotid bruit, no JVD, supple, symmetrical, trachea midline and thyroid not enlarged, symmetric, no tenderness/mass/nodules Lungs: clear to auscultation bilaterally, normal percussion bilaterally and Nonlabored, good air movement Heart: regular rate and rhythm, S1, S2 normal, no murmur, click, rub or gallop and normal apical impulse Abdomen: soft, non-tender; bowel sounds normal; no masses, no organomegaly; no bruit Extremities: no edema, redness or tenderness in the calves or thighs Pulses: 2+ and symmetric Neurologic: Alert and oriented X 3, normal strength and tone. Normal symmetric reflexes. Normal coordination and gait   Adult ECG Report  Rate: 66 ;  Rhythm: normal sinus rhythm; normal voltage, intervals and durations. Normal axis  Narrative Interpretation: Normal EKG   Recent Labs:  None since 05/2013  - reviewed  Lab Results  Component Value Date   CHOL 93 06/14/2013   HDL 34* 06/14/2013   LDLCALC 46 06/14/2013   TRIG  67 06/14/2013   CHOLHDL 2.7 06/14/2013   ASSESSMENT / PLAN: Problem List Items Addressed This Visit    AAA (abdominal aortic aneurysm)- 4.3 cm by [place] 11/14 (Chronic)    Was seen by Dr. Donnetta Hutching from [facility] - plan is annual surveillance. Stable. CRF modification.      Relevant Medications   atorvastatin (LIPITOR) tablet   nitroGLYCERIN (NITROSTAT) SL tablet   Borderline hypertension (Chronic)    Did not tolerate low-dose beta blocker mostly because of fatigue and hypotension. Would only consider reinitiating if his EF continues to be reduced. - would prefer using ARB instead.      CAD S/P Prox-Mid LAD; Promus DES 02/10/13 (Chronic)    Stable no active angina symptoms. He is on Plavix alone for maintenance. Refilling nitroglycerin. He is not on a beta blocker because of issue with fatigue. We did use a low dose perioperatively, but he did not stay on it. He really does not have much blood pressure room to use beta blockers or ACE  inhibitor/ARB.    I did check a P2Y12 Inhibitor Assay - but the result was quite unusual. I want to recheck to insure that he is not hyperresponsive to Plavix -- if so, we may need to adjust therapy.      Relevant Medications   atorvastatin (LIPITOR) tablet   nitroGLYCERIN (NITROSTAT) SL tablet   Other Relevant Orders   EKG 12-Lead   Platelet inhibition p2y12   Comprehensive metabolic panel   NMR Lipoprofile with Lipids   2D Echocardiogram without contrast   Encounter for long-term (current) use of medications    Potentially stating ARB - check CMP Statin - checking NMR with LFTs (as part of CMP)      Relevant Orders   Platelet inhibition p2y12   Comprehensive metabolic panel   NMR Lipoprofile with Lipids   H/O atrial fibrillation without current medication - peri-infarct with no recurrence - Primary (Chronic)    No evidence of any recurrence. He converted with amiodarone post MI/PCI. Monitor for any recurrence.      Relevant Medications   atorvastatin (LIPITOR) tablet   nitroGLYCERIN (NITROSTAT) SL tablet   Other Relevant Orders   EKG 12-Lead   Platelet inhibition p2y12   Comprehensive metabolic panel   NMR Lipoprofile with Lipids   Hyperlipidemia with target LDL less than 70 (Chronic)    Previous levels were well controlled. We have reduced his statin.  No myalgias Plan: Continue statin & check NMR panel with LFTs from Chem panel      Relevant Medications   atorvastatin (LIPITOR) tablet   nitroGLYCERIN (NITROSTAT) SL tablet   LV dysfunction, EF by echo 02/11/13 45-50% (Chronic)    I plan to order an echocardiogram as part of his followup from mild ischemic cardiomyopathy. We did not discuss this during his clinic visit, we will contact him to have this scheduled.  I will place the order now. Results Will determine  whether or not we need to be more aggressive with beta blocker or ARB/ACE-I therapy .      Relevant Medications   atorvastatin (LIPITOR) tablet    nitroGLYCERIN (NITROSTAT) SL tablet   Other Relevant Orders   2D Echocardiogram without contrast   STEMI of anterior wall - 02/10/13 (Chronic)    He has not had any further symptoms.no heart failure. He did have a mild reduction in EF time his MI. We have not checked a followup echocardiogram to get a new baseline. My impression based on his  symptomatology as that is probably back to normal. He had a relatively rapid revascularization.      Relevant Medications   atorvastatin (LIPITOR) tablet   nitroGLYCERIN (NITROSTAT) SL tablet   Other Relevant Orders   2D Echocardiogram without contrast      Orders Placed This Encounter  Procedures  . Platelet inhibition p2y12  . Comprehensive metabolic panel    Order Specific Question:  Has the patient fasted?    Answer:  Yes  . NMR Lipoprofile with Lipids  . EKG 12-Lead  . 2D Echocardiogram without contrast    Trixie Dredge to contact Patient to inform him prior to Scheduling.    Standing Status: Future     Number of Occurrences:      Standing Expiration Date: 06/10/2015    Order Specific Question:  Type of Echo    Answer:  Complete    Order Specific Question:  Where should this test be performed    Answer:  [facility]    Order Specific Question:  Reason for exam-Echo    Answer:  Cardiomyopathy-Ischemic  414.8 / I25.5    Order Specific Question:  Reason for exam-Echo    Answer:  CAD Native Vessel  414.01 / I25.10    Order Specific Question:  Reason for exam-Echo    Answer:  Myocardial Infact-old  410.9 / I21.3   Meds ordered this encounter  Medications  . alfuzosin (UROXATRAL) 10 MG 24 hr tablet    Sig: Take 10 mg by mouth daily.    Refill:  10  . atorvastatin (LIPITOR) 40 MG tablet    Sig: Take 1 tablet (40 mg total) by mouth daily.    Dispense:  90 tablet    Refill:  3  . DISCONTD: nitroGLYCERIN (NITROSTAT) 0.4 MG SL tablet    Sig: Place 1 tablet (0.4 mg total) under the tongue every 5 (five) minutes as needed for chest  pain.    Dispense:  25 tablet    Refill:  3  . nitroGLYCERIN (NITROSTAT) 0.4 MG SL tablet    Sig: Place 1 tablet (0.4 mg total) under the tongue every 5 (five) minutes as needed for chest pain.    Dispense:  25 tablet    Refill:  3    Followup: 8yr    Leonie Man, M.D., M.S. Interventional Cardiologist   Pager # [phone]

## 2014-06-10 NOTE — Assessment & Plan Note (Signed)
Was seen by Dr. Donnetta Hutching from [facility] - plan is annual surveillance. Stable. CRF modification.

## 2014-06-10 NOTE — Assessment & Plan Note (Signed)
Did not tolerate low-dose beta blocker mostly because of fatigue and hypotension. Would only consider reinitiating if his EF continues to be reduced. - would prefer using ARB instead.

## 2014-06-10 NOTE — Assessment & Plan Note (Signed)
Potentially stating ARB - check CMP Statin - checking NMR with LFTs (as part of CMP)

## 2014-06-10 NOTE — Assessment & Plan Note (Addendum)
Previous levels were well controlled. We have reduced his statin.  No myalgias Plan: Continue statin & check NMR panel with LFTs from Chem panel

## 2014-06-10 NOTE — Assessment & Plan Note (Signed)
He has not had any further symptoms.no heart failure. He did have a mild reduction in EF time his MI. We have not checked a followup echocardiogram to get a new baseline. My impression based on his symptomatology as that is probably back to normal. He had a relatively rapid revascularization.

## 2014-06-10 NOTE — Assessment & Plan Note (Addendum)
Stable no active angina symptoms. He is on Plavix alone for maintenance. Refilling nitroglycerin. He is not on a beta blocker because of issue with fatigue. We did use a low dose perioperatively, but he did not stay on it. He really does not have much blood pressure room to use beta blockers or ACE inhibitor/ARB.    I did check a P2Y12 Inhibitor Assay - but the result was quite unusual. I want to recheck to insure that he is not hyperresponsive to Plavix -- if so, we may need to adjust therapy.

## 2014-06-10 NOTE — Assessment & Plan Note (Signed)
No evidence of any recurrence. He converted with amiodarone post MI/PCI. Monitor for any recurrence.

## 2014-06-10 NOTE — Assessment & Plan Note (Addendum)
I plan to order an echocardiogram as part of his followup from mild ischemic cardiomyopathy. We did not discuss this during his clinic visit, we will contact him to have this scheduled.  I will place the order now. Results Will determine  whether or not we need to be more aggressive with beta blocker or ARB/ACE-I therapy .

## 2014-06-12 ENCOUNTER — Other Ambulatory Visit (HOSPITAL_COMMUNITY)
Admission: AD | Admit: 2014-06-12 | Discharge: 2014-06-12 | Disposition: A | Discharge status: Home / Self Care | Payer: Medicare Other | Source: Ambulatory Visit | Attending: Cardiology | Admitting: Cardiology

## 2014-06-12 DIAGNOSIS — I480 Paroxysmal atrial fibrillation: Secondary | ICD-10-CM | POA: Diagnosis present

## 2014-06-12 DIAGNOSIS — I2510 Atherosclerotic heart disease of native coronary artery without angina pectoris: Secondary | ICD-10-CM | POA: Diagnosis not present

## 2014-06-12 LAB — PLATELET INHIBITION P2Y12: Platelet Function  P2Y12: 96 [PRU] — ABNORMAL LOW (ref 194–418)

## 2014-06-13 ENCOUNTER — Telehealth: Payer: Self-pay | Admitting: *Deleted

## 2014-06-13 NOTE — Telephone Encounter (Signed)
-----   Message from Leonie Man, MD sent at 06/12/2014  5:59 PM EDT ----- This value is much better - I think that the oriinal # was not entered completely.  OK to continue Plavix.

## 2014-06-13 NOTE — Telephone Encounter (Signed)
Spoke to patient. Result given . Verbalized understanding  

## 2014-06-14 ENCOUNTER — Telehealth: Payer: Self-pay | Admitting: *Deleted

## 2014-06-14 NOTE — Telephone Encounter (Signed)
Per Dr Justine Null echo-- reassess after year from  MI  Spoke to patient , he is aware echo will be schedule

## 2014-06-15 ENCOUNTER — Telehealth (HOSPITAL_COMMUNITY): Payer: Self-pay | Admitting: *Deleted

## 2014-06-15 LAB — COMPREHENSIVE METABOLIC PANEL
ALT: 19 U/L (ref 0–53)
AST: 28 U/L (ref 0–37)
Albumin: 4.2 g/dL (ref 3.5–5.2)
Alkaline Phosphatase: 59 U/L (ref 39–117)
BILIRUBIN TOTAL: 0.5 mg/dL (ref 0.2–1.2)
BUN: 20 mg/dL (ref 6–23)
CO2: 30 mEq/L (ref 19–32)
CREATININE: 1.08 mg/dL (ref 0.50–1.35)
Calcium: 9.8 mg/dL (ref 8.4–10.5)
Chloride: 105 mEq/L (ref 96–112)
Glucose, Bld: 98 mg/dL (ref 70–99)
Potassium: 4.7 mEq/L (ref 3.5–5.3)
Sodium: 141 mEq/L (ref 135–145)
Total Protein: 6.6 g/dL (ref 6.0–8.3)

## 2014-06-16 ENCOUNTER — Telehealth (HOSPITAL_COMMUNITY): Payer: Self-pay | Admitting: *Deleted

## 2014-06-17 LAB — NMR LIPOPROFILE WITH LIPIDS
CHOLESTEROL, TOTAL: 107 mg/dL (ref 100–199)
HDL Particle Number: 26.8 umol/L — ABNORMAL LOW (ref >=30.5)
HDL Size: 8.5 nm — ABNORMAL LOW (ref >=9.2)
HDL-C: 38 mg/dL — ABNORMAL LOW (ref >39)
LDL (calc): 53 mg/dL (ref 0–99)
LDL PARTICLE NUMBER: 689 nmol/L (ref <1000)
LDL SIZE: 20.7 nm (ref >=20.8)
LP-IR Score: 43 (ref <=45)
Large HDL-P: 1.6 umol/L — ABNORMAL LOW (ref >=4.8)
Large VLDL-P: <0.8 nmol/L (ref <=2.7)
SMALL LDL PARTICLE NUMBER: 195 nmol/L (ref <=527)
TRIGLYCERIDES: 81 mg/dL (ref 0–149)
VLDL SIZE: 40.2 nm (ref <=46.6)

## 2014-06-23 ENCOUNTER — Ambulatory Visit (HOSPITAL_COMMUNITY)
Admission: RE | Admit: 2014-06-23 | Discharge: 2014-06-23 | Disposition: A | Discharge status: Home / Self Care | Payer: Medicare Other | Source: Ambulatory Visit | Attending: Cardiovascular Disease | Admitting: Cardiovascular Disease

## 2014-06-23 DIAGNOSIS — Z9861 Coronary angioplasty status: Secondary | ICD-10-CM

## 2014-06-23 DIAGNOSIS — Z87891 Personal history of nicotine dependence: Secondary | ICD-10-CM | POA: Diagnosis not present

## 2014-06-23 DIAGNOSIS — I2109 ST elevation (STEMI) myocardial infarction involving other coronary artery of anterior wall: Secondary | ICD-10-CM

## 2014-06-23 DIAGNOSIS — I2510 Atherosclerotic heart disease of native coronary artery without angina pectoris: Secondary | ICD-10-CM | POA: Insufficient documentation

## 2014-06-23 DIAGNOSIS — E785 Hyperlipidemia, unspecified: Secondary | ICD-10-CM | POA: Insufficient documentation

## 2014-06-23 DIAGNOSIS — I519 Heart disease, unspecified: Secondary | ICD-10-CM

## 2014-06-23 HISTORY — PX: TRANSTHORACIC ECHOCARDIOGRAM: SHX275

## 2014-06-23 NOTE — Progress Notes (Signed)
2D Echocardiogram Complete.  06/23/2014   [name] [place], RDCS

## 2014-06-29 ENCOUNTER — Telehealth: Payer: Self-pay | Admitting: *Deleted

## 2014-06-29 NOTE — Telephone Encounter (Signed)
-----   Message from Leonie Man, MD sent at 06/26/2014  6:15 PM EDT ----- Relatively normal Echo  EF improved to ~50-55% & less prominent wall motion abnormality. Grade 1 diastolic dysfunction is probably normal for age.  DH Pls forward to Dr. Dorian Heckle (PCP - [facility])

## 2014-06-29 NOTE — Telephone Encounter (Signed)
-----   Message from Leonie Man, MD sent at 06/26/2014  6:07 PM EDT ----- Labs look pretty good.  Chem panel - electrolytes & kidney/liver function look great.  Lipids look pretty good --> HDL is not as good as we would like - hard to treat this besides exercise. LDL does not quite "correlate" screening (calculated) vs. Measured particle # - but still well within goal.  DH  Pls fwd to PCP

## 2014-06-29 NOTE — Telephone Encounter (Signed)
Patient is not available.Spoke to wife. Result given . Verbalized understanding Information release on MY Chart. Any question can call back

## 2014-07-04 ENCOUNTER — Telehealth: Payer: Self-pay | Admitting: Cardiology

## 2014-07-04 MED ORDER — CLOPIDOGREL BISULFATE 75 MG PO TABS: 75.0000 mg | ORAL_TABLET | Freq: Every day | ORAL | Status: DC

## 2014-07-04 NOTE — Telephone Encounter (Signed)
°  1. Which medications need to be refilled? Plavix  2. Which pharmacy is medication to be sent to?[facility] on [address]  3. Do they need a 30 day or 90 day supply? 30  4. Would they like a call back once the medication has been sent to the pharmacy? yes

## 2014-07-04 NOTE — Telephone Encounter (Signed)
Rx(s) sent to pharmacy electronically. Patient notified. 

## 2014-07-17 ENCOUNTER — Other Ambulatory Visit: Payer: Self-pay | Admitting: Cardiology

## 2014-07-17 NOTE — Telephone Encounter (Signed)
Advise if OK to refill

## 2014-07-17 NOTE — Telephone Encounter (Signed)
Reached pt - asked if he has a urologist or PCP - he states he does, apologized bc he meant to call them for this. Stated no problem - call for any needs.

## 2014-07-17 NOTE — Telephone Encounter (Signed)
°  1. Which medications need to be refilled? Alfuzosin  2. Which pharmacy is medication to be sent to?[facility]   3. Do they need a 30 day or 90 day supply? 30  4. Would they like a call back once the medication has been sent to the pharmacy? Yes

## 2014-09-18 ENCOUNTER — Other Ambulatory Visit: Payer: Self-pay

## 2014-10-16 ENCOUNTER — Other Ambulatory Visit: Payer: Self-pay | Admitting: Cardiology

## 2014-11-14 ENCOUNTER — Ambulatory Visit: Payer: Medicare Other | Admitting: Vascular Surgery

## 2014-11-14 ENCOUNTER — Ambulatory Visit (HOSPITAL_COMMUNITY)
Admission: RE | Admit: 2014-11-14 | Discharge: 2014-11-14 | Disposition: A | Discharge status: Home / Self Care | Payer: Medicare Other | Source: Ambulatory Visit | Attending: Vascular Surgery | Admitting: Vascular Surgery

## 2014-11-14 DIAGNOSIS — I714 Abdominal aortic aneurysm, without rupture, unspecified: Secondary | ICD-10-CM

## 2014-11-24 ENCOUNTER — Encounter: Payer: Self-pay | Admitting: Vascular Surgery

## 2014-11-28 ENCOUNTER — Ambulatory Visit: Payer: Medicare Other | Admitting: Vascular Surgery

## 2014-11-28 ENCOUNTER — Ambulatory Visit (INDEPENDENT_AMBULATORY_CARE_PROVIDER_SITE_OTHER): Payer: Medicare Other | Admitting: Vascular Surgery

## 2014-11-28 ENCOUNTER — Encounter: Payer: Self-pay | Admitting: Vascular Surgery

## 2014-11-28 VITALS — BP 105/58 | HR 68 | Temp 97.0°F | Resp 16 | Ht 70.0 in | Wt 172.0 lb

## 2014-11-28 DIAGNOSIS — I714 Abdominal aortic aneurysm, without rupture, unspecified: Secondary | ICD-10-CM

## 2014-11-28 NOTE — Progress Notes (Signed)
Here today for continued follow-up of infrarenal abdominal aortic aneurysm. He remains in excellent health his age of 35. No cardiac difficulties.  Past Medical History  Diagnosis Date  . Prostate disease   . ST elevation myocardial infarction (STEMI) of anterior wall, initial episode of care 02/10/2013  . CAD S/P percutaneous coronary angioplasty 02/13/2013    PROXIMAL-MID LAD 95-99% AND 80% TANDEM LESIONS - PROMUS PREMIER DES 2.75 MM X 24 MM (2.9 MM)  . LV dysfunction, EF by echo 02/11/13 45-50% 02/13/2013  . Atrial fibrillation 02/13/2013    Peri-infarction  . Arthritis   . Abdominal aortic aneurysm     being monitored-"checked in Oct. and it stayed the same"  . Tremors of nervous system     both hands-under control  . Macular degeneration of left eye   . 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 Smoking status: Former Smoker -- 1.00 packs/day for 10 years    Types: Cigarettes    Quit date: 06/07/1983  . Smokeless tobacco: Never Used  . Alcohol Use: 0.0 oz/week    0 Standard drinks or equivalent per week     Comment: social    History reviewed. No pertinent family history.  Allergies  Allergen Reactions  . Ciprofloxacin Diarrhea and Nausea Only     Current outpatient prescriptions:  .  alfuzosin (UROXATRAL) 10 MG 24 hr tablet, Take 10 mg by mouth daily., Disp: , Rfl: 10 .  atorvastatin (LIPITOR) 40 MG tablet, Take 1 tablet (40 mg total) by mouth daily., Disp: 90 tablet, Rfl: 3 .  CINNAMON PO, Take 1,000 mg by mouth 2 (two) times daily., Disp: , Rfl:  .  clopidogrel (PLAVIX) 75 MG tablet, Take 1 tablet (75 mg total) by mouth daily., Disp: 30 tablet, Rfl: 11 .  Coenzyme Q10 (Q-10 CO-ENZYME PO), Take 100 mg by mouth every morning. , Disp: , Rfl:  .  Glucosamine-Chondroit-Vit C-Mn (GLUCOSAMINE 1500 COMPLEX PO), Take 1,500 mg by mouth 2 (two) times daily., Disp: , Rfl:  .  loteprednol (ALREX) 0.2 % SUSP, Place 1-2 drops into the  left eye daily as needed (dryness in eye after injection.)., Disp: , Rfl:  .  Multiple Vitamins-Minerals (ICAPS MV PO), Take 1 capsule by mouth 2 (two) times daily., Disp: , Rfl:  .  NITROSTAT 0.4 MG SL tablet, PLACE 1 TABLET UNDER TONGUE EVERY 5 MINUTES AS NEEDED FOR CHEST PAIN, Disp: 25 tablet, Rfl: 3 .  primidone (MYSOLINE) 50 MG tablet, Take 100 mg by mouth every morning. , Disp: , Rfl:  .  sodium chloride (OCEAN) 0.65 % SOLN nasal spray, Place 1 spray into both nostrils every 12 (twelve) hours as needed for congestion., Disp: , Rfl:  .  allopurinol (ZYLOPRIM) 100 MG tablet, Take 100 mg by mouth daily as needed (gout flare up). , Disp: , Rfl:  .  furosemide (LASIX) 20 MG tablet, TAKE 1 TABLET BY MOUTH EVERY DAY as needed, Disp: , Rfl:  .  furosemide (LASIX) 20 MG tablet, Take 20 mg by mouth daily as needed for fluid or edema., Disp: , Rfl:  .  LORazepam (ATIVAN) 1 MG tablet, Take 0.5 mg by mouth daily as needed for anxiety. , Disp: , Rfl:   Filed Vitals:   11/28/14 1318  BP: 105/58  Pulse: 68  Temp: 97 F (36.1 C)  Resp: 16  Height: 5\' 10"  (1.778 m)  Weight: 172 lb (78.019  kg)  SpO2: 97%    Body mass index is 24.68 kg/(m^2).       On physical exam, his abdomen is soft nontender no masses noted. Slightly prominent aortic pulsation Pedal pulses are normal bilaterally.  Duplex from 11/14/2014 was reviewed with the patient. This shows no change in his maximal size of his aneurysm at 4.5 cm.  Impression and plan stable infrarenal abdominal aortic aneurysm with no change since CT from 6 months ago. We'll continue his usual activity with no limitation. Plan to see him again in 6 months with repeat ultrasound

## 2014-11-29 NOTE — Addendum Note (Signed)
Addended by: Dorthula Rue L on: 11/29/2014 04:04 PM   Modules accepted: Orders

## 2014-12-11 ENCOUNTER — Telehealth: Payer: Self-pay | Admitting: Cardiology

## 2014-12-11 NOTE — Telephone Encounter (Signed)
Message routed to Dr. Ellyn Hack

## 2014-12-11 NOTE — Telephone Encounter (Signed)
OK to stop Plavix 5-7 days pre-procedure. Restart ~2-3 days post  Leonie Man, MD

## 2014-12-11 NOTE — Telephone Encounter (Signed)
Mr.Skora is having dental work on 12/18/14 (remove a tooth) and wants to know when should he stop his blood thinner . Please call   Thanks

## 2014-12-11 NOTE — Telephone Encounter (Signed)
Patient called and notified of MD advice. He voiced understanding.

## 2015-01-03 ENCOUNTER — Telehealth: Payer: Self-pay | Admitting: Cardiology

## 2015-01-03 NOTE — Telephone Encounter (Signed)
Spoke with patient who was at his urologist office this AM and his BP was 84/56. He reports he is a little lightheaded today but no prescynopal feelings. He reports he was out in the yard doing work yesterday and had little fluid intake. He has a home BP cuff which he tried to use this AM so he could check it against his reading at the MD office but he reports his BP machine is 'all over the place' and needs to be calibrated. He does not take BP medications and has not take lasix is >1 year. Advised patient to increase PO intake and to get new BP cuff, get his machine calibrated, or borrow a cuff from someone if able and monitor BP for a few days. Apart from the lightheadedness, patient feels fine.   Will defer to MD and Pharmacist to advise on low BP

## 2015-01-03 NOTE — Telephone Encounter (Signed)
Hold lasix today & tomorrow. Increase PO hydration & eat.  Wait until after breakfast to take lasix going forward.  Leonie Man, MD

## 2015-01-03 NOTE — Telephone Encounter (Signed)
Returned call to patient and he reports he is doing much better. He report his BP is 94/60 and he is no longer lightheaded. He was advised to eat good meals today and increase PO intake. He will call back if further assistance is needed.   Patient does NOT take daily lasix and has not take prn lasix since 1 year ago

## 2015-01-03 NOTE — Telephone Encounter (Signed)
Pt just left his Urinologist,his blood pressure was low.It was 84/55,was told to call here.

## 2015-03-28 ENCOUNTER — Telehealth: Payer: Self-pay | Admitting: Cardiology

## 2015-03-28 NOTE — Telephone Encounter (Signed)
Pt wants to know if he can take a laxative. He thought he remembers Dr Ellyn Hack telling him not to take a laxative.If not at this phone number,please [phone].

## 2015-03-28 NOTE — Telephone Encounter (Signed)
That sounds like reasonable advice. If he needs to use a laxative on a when necessary basis that would be okay. Would prefer otherwise not to. Could also just simply use organic substances such as prune juice or prunes.  [place]

## 2015-03-28 NOTE — Telephone Encounter (Signed)
Reviewed and discussed w/ patient. Has been constipated for 2 days. Took 1 stool softener w/ no discernable effect. He asked about laxative use. Recommended not using laxatives d/t potential for increased bleed risk & possible fluid retention r/t osmotic changes. Noted this is more concern w/ long term use but if possible, avoid entirely.  Advised OTC stool softener (Colace) not to exceed box instructions for daily use.  If no relief f/u w/ PCP. Pt voiced understanding.  Routed to Dr. Ellyn Hack for any additional considerations.

## 2015-05-15 ENCOUNTER — Telehealth: Payer: Self-pay | Admitting: Cardiology

## 2015-05-15 NOTE — Telephone Encounter (Signed)
OK to stop plavix x 5-7 days pre-procedure.  Leonie Man, MD

## 2015-05-15 NOTE — Telephone Encounter (Signed)
New Message  Pt calling to speak w/ RN concerning possibility of having tooth removed. Pt wanted to speak w/ RN concerning his blood thinner. Can call below home # number if mobile number does not answer. Please call back and discuss.    [phone]

## 2015-05-15 NOTE — Telephone Encounter (Signed)
Returned pt call. He is possibly having single tooth extraction, if capping not successful. Dentist office wanted to have pt get recommendation from [place] on what to do w/ Plavix.  Advised no need for discontinuation of medication for single extraction. Advised to have dentist office call if needing further clarification or if they want to request a clearance or hold on med.  Pt aware and voiced thanks.

## 2015-05-16 NOTE — Telephone Encounter (Signed)
LMTCB

## 2015-05-22 ENCOUNTER — Encounter: Payer: Self-pay | Admitting: Family

## 2015-05-30 ENCOUNTER — Ambulatory Visit (INDEPENDENT_AMBULATORY_CARE_PROVIDER_SITE_OTHER): Payer: Medicare Other | Admitting: Family

## 2015-05-30 ENCOUNTER — Ambulatory Visit (HOSPITAL_COMMUNITY)
Admission: RE | Admit: 2015-05-30 | Discharge: 2015-05-30 | Disposition: A | Discharge status: Home / Self Care | Payer: Medicare Other | Source: Ambulatory Visit | Attending: Family | Admitting: Family

## 2015-05-30 ENCOUNTER — Encounter: Payer: Self-pay | Admitting: Family

## 2015-05-30 VITALS — BP 119/70 | HR 70 | Ht 70.0 in | Wt 178.0 lb

## 2015-05-30 DIAGNOSIS — I714 Abdominal aortic aneurysm, without rupture, unspecified: Secondary | ICD-10-CM

## 2015-05-30 NOTE — Addendum Note (Signed)
Addended by: Thresa Ross C on: 05/30/2015 03:10 PM   Modules accepted: Orders

## 2015-05-30 NOTE — Progress Notes (Signed)
[facility] [place]  Established Abdominal Aortic Aneurysm  History of Present Illness  [name] is a 82 y.o. (1931/12/26) male patient of Dr. Donnetta Hutching returns for continued follow-up of infrarenal abdominal aortic aneurysm.  Previous studies demonstrate an AAA, measuring 4.5 cm.  The patient denies back or abdominal pain.  The patient is not a smoker. The patient denies claudication in legs with walking. The patient denies history of stroke or TIA symptoms.  He takes Plavix and a statin.   Pt Diabetic: No Pt smoker: former smoker, quit in 1987  Past Medical History  Diagnosis Date  . Prostate disease   . ST elevation myocardial infarction (STEMI) of anterior wall, initial episode of care 02/10/2013  . CAD S/P percutaneous coronary angioplasty 02/13/2013    PROXIMAL-MID LAD 95-99% AND 80% TANDEM LESIONS - PROMUS PREMIER DES 2.75 MM X 24 MM (2.9 MM)  . LV dysfunction, EF by echo 02/11/13 45-50% 02/13/2013  . Atrial fibrillation 02/13/2013    Peri-infarction  . Arthritis   . Abdominal aortic aneurysm     being monitored-"checked in Oct. and it stayed the same"  . Tremors of nervous system     both hands-under control  . Macular degeneration of left eye   . 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 Percutaneous coronary stent intervention (pci-s)  02/10/13    PROXIMAL-MID LAD 95-99% AND 80% TANDEM LESIONS - PROMUS PREMIER DES 2.75 MM X 24 MM (2.9 MM)  . Appendectomy  age 66   . Hernia repair Left 2000  . Green light laser turp (transurethral resection of prostate N/A 02/03/2014    Procedure: GREEN LIGHT LASER TURP (TRANSURETHRAL RESECTION OF PROSTATE PHOTO VAPORIZATION;  Surgeon: Festus Aloe, MD;  Location: [facility];  Service: Urology;  Laterality: N/A;  . Cystoscopy w/ retrogrades Bilateral 02/03/2014    Procedure:  BILATERAL RETROGRADE PYELOGRAM;  Surgeon: Festus Aloe, MD;  Location: [facility];   Service: Urology;  Laterality: Bilateral;  . Left heart catheterization with coronary angiogram N/A 02/10/2013    Procedure: LEFT HEART CATHETERIZATION WITH CORONARY ANGIOGRAM;  Surgeon: Leonie Man, MD;  Location[facility] C[facility] LAB;  Service: Cardiovascular;  Laterality: N/A;   Social History Social History   Social History  . Marital Status: Married    Spouse Name: N/A  . Number of Children: N/A  . Years of Education: N/A   Occupational History  . Not on file.   Social History Main Topics  . Smoking status: Former Smoker -- 1.00 packs/day for 10 years    Types: Cigarettes    Quit date: 06/07/1983  . Smokeless tobacco: Never Used  . Alcohol Use: 0.0 oz/week    0 Standard drinks or equivalent per week     Comment: social  . Drug Use: No  . Sexual Activity: Not on file   Other Topics Concern  . Not on file   Social History Narrative   He is married. Retired.   Quit smoking in 1985. Does not drink alcohol.   Currently ready to graduate from cardiac rehabilitation. Plans to sign up for followup program at the local [employer].   Family History No family history on file.  Current Outpatient Prescriptions on File Prior to Visit  Medication Sig Dispense Refill  . alfuzosin (UROXATRAL) 10 MG 24 hr tablet Take 10 mg by mouth daily.  10  . allopurinol (ZYLOPRIM) 100 MG tablet  Take 100 mg by mouth daily as needed (gout flare up).     Marland Kitchen atorvastatin (LIPITOR) 40 MG tablet Take 1 tablet (40 mg total) by mouth daily. 90 tablet 3  . CINNAMON PO Take 1,000 mg by mouth 2 (two) times daily.    . clopidogrel (PLAVIX) 75 MG tablet Take 1 tablet (75 mg total) by mouth daily. 30 tablet 11  . Coenzyme Q10 (Q-10 CO-ENZYME PO) Take 100 mg by mouth every morning.     . furosemide (LASIX) 20 MG tablet TAKE 1 TABLET BY MOUTH EVERY DAY as needed    . furosemide (LASIX) 20 MG tablet Take 20 mg by mouth daily as needed for fluid or edema.    . Glucosamine-Chondroit-Vit C-Mn (GLUCOSAMINE 1500 COMPLEX PO)  Take 1,500 mg by mouth 2 (two) times daily.    Marland Kitchen LORazepam (ATIVAN) 1 MG tablet Take 0.5 mg by mouth daily as needed for anxiety.     Marland Kitchen loteprednol (ALREX) 0.2 % SUSP Place 1-2 drops into the left eye daily as needed (dryness in eye after injection.).    Marland Kitchen Multiple Vitamins-Minerals (ICAPS MV PO) Take 1 capsule by mouth 2 (two) times daily.    Marland Kitchen NITROSTAT 0.4 MG SL tablet PLACE 1 TABLET UNDER TONGUE EVERY 5 MINUTES AS NEEDED FOR CHEST PAIN 25 tablet 3  . primidone (MYSOLINE) 50 MG tablet Take 100 mg by mouth every morning.     . sodium chloride (OCEAN) 0.65 % SOLN nasal spray Place 1 spray into both nostrils every 12 (twelve) hours as needed for congestion.     No current facility-administered medications on file prior to visit.   Allergies  Allergen Reactions  . Ciprofloxacin Diarrhea and Nausea Only    ROS: See HPI for pertinent positives and negatives.  Physical Examination  Filed Vitals:   05/30/15 0856  BP: 119/70  Pulse: 70  Height: 5\' 10"  (1.778 m)  Weight: 178 lb (80.74 kg)  SpO2: 96%   Body mass index is 25.54 kg/(m^2).  General: A&O x 3, WD.  Pulmonary: Sym exp, good air movt, CTAB, no rales, rhonchi, or wheezing.  Cardiac: RRR, Nl S1, S2, no detected murmur.   Carotid Bruits Right Left   Negative Negative   Aorta is not palpable Radial pulses are 2+ palpable and =                          VASCULAR EXAM:                                                                                                         LE Pulses Right Left       FEMORAL   palpable   palpable        POPLITEAL  not palpable   not palpable       POSTERIOR TIBIAL   palpable    palpable        DORSALIS PEDIS      ANTERIOR TIBIAL  palpable   palpable  Gastrointestinal: soft, NTND, -G/R, - HSM, - masses palpated, - CVAT B.  Musculoskeletal: M/S 5/5 throughout, Extremities without ischemic changes.  Neurologic: CN 2-12 intact, Pain and light touch intact in extremities are  intact, Motor exam as listed above.  Non-Invasive Vascular Imaging  AAA Duplex (05/30/2015)  Previous size: 4.5 cm (Date: 11/14/14)  Current size:  4.23 cm (Date: 05/30/2015)  Medical Decision Making  The patient is a 82 y.o. male who presents with asymptomatic AAA with no increase in size.   Based on this patient's exam and diagnostic studies, the patient will follow up in 6 months  with the following studies: AAA duplex.  Consideration for repair of AAA would be made when the size is 5.5 cm, growth > 1 cm/yr, and symptomatic status.  I emphasized the importance of maximal medical management including strict control of blood pressure, blood glucose, and lipid levels, antiplatelet agents, obtaining regular exercise, and continued cessation of smoking.   The patient was given information about AAA including signs, symptoms, treatment, and how to minimize the risk of enlargement and rupture of aneurysms.    The patient was advised to call 911 should the patient experience sudden onset abdominal or back pain.   Thank you for allowing [place] to participate in this patient's care.  Clemon Chambers, RN, MSN, FNP-C [facility] [place] Office: [phone]  Clinic Physician: Scot Dock  05/30/2015, 8:55 AM

## 2015-05-30 NOTE — Patient Instructions (Signed)
Abdominal Aortic Aneurysm An aneurysm is a weakened or damaged part of an artery wall that bulges from the normal force of blood pumping through the body. An abdominal aortic aneurysm is an aneurysm that occurs in the lower part of the aorta, the main artery of the body.  The major concern with an abdominal aortic aneurysm is that it can enlarge and burst (rupture) or blood can flow between the layers of the wall of the aorta through a tear (aorticdissection). Both of these conditions can cause bleeding inside the body and can be life threatening unless diagnosed and treated promptly. CAUSES  The exact cause of an abdominal aortic aneurysm is unknown. Some contributing factors are:   A hardening of the arteries caused by the buildup of fat and other substances in the lining of a blood vessel (arteriosclerosis).  Inflammation of the walls of an artery (arteritis).   Connective tissue diseases, such as Marfan syndrome.   Abdominal trauma.   An infection, such as syphilis or staphylococcus, in the wall of the aorta (infectious aortitis) caused by bacteria. RISK FACTORS  Risk factors that contribute to an abdominal aortic aneurysm may include:  Age older than 60 years.   High blood pressure (hypertension).  Male gender.  Ethnicity (white race).  Obesity.  Family history of aneurysm (first degree relatives only).  Tobacco use. PREVENTION  The following healthy lifestyle habits may help decrease your risk of abdominal aortic aneurysm:  Quitting smoking. Smoking can raise your blood pressure and cause arteriosclerosis.  Limiting or avoiding alcohol.  Keeping your blood pressure, blood sugar level, and cholesterol levels within normal limits.  Decreasing your salt intake. In somepeople, too much salt can raise blood pressure and increase your risk of abdominal aortic aneurysm.  Eating a diet low in saturated fats and cholesterol.  Increasing your fiber intake by including  whole grains, vegetables, and fruits in your diet. Eating these foods may help lower blood pressure.  Maintaining a healthy weight.  Staying physically active and exercising regularly. SYMPTOMS  The symptoms of abdominal aortic aneurysm may vary depending on the size and rate of growth of the aneurysm.Most grow slowly and do not have any symptoms. When symptoms do occur, they may include:  Pain (abdomen, side, lower back, or groin). The pain may vary in intensity. A sudden onset of severe pain may indicate that the aneurysm has ruptured.  Feeling full after eating only small amounts of food.  Nausea or vomiting or both.  Feeling a pulsating lump in the abdomen.  Feeling faint or passing out. DIAGNOSIS  Since most unruptured abdominal aortic aneurysms have no symptoms, they are often discovered during diagnostic exams for other conditions. An aneurysm may be found during the following procedures:  Ultrasonography (A one-time screening for abdominal aortic aneurysm by ultrasonography is also recommended for all men aged 65-75 years who have ever smoked).  X-ray exams.  A computed tomography (CT).  Magnetic resonance imaging (MRI).  Angiography or arteriography. TREATMENT  Treatment of an abdominal aortic aneurysm depends on the size of your aneurysm, your age, and risk factors for rupture. Medication to control blood pressure and pain may be used to manage aneurysms smaller than 6 cm. Regular monitoring for enlargement may be recommended by your caregiver if:  The aneurysm is 3-4 cm in size (an annual ultrasonography may be recommended).  The aneurysm is 4-4.5 cm in size (an ultrasonography every 6 months may be recommended).  The aneurysm is larger than 4.5 cm in   size (your caregiver may ask that you be examined by a vascular surgeon). If your aneurysm is larger than 6 cm, surgical repair may be recommended. There are two main methods for repair of an aneurysm:   Endovascular  repair (a minimally invasive surgery). This is done most often.  Open repair. This method is used if an endovascular repair is not possible.   This information is not intended to replace advice given to you by your health care provider. Make sure you discuss any questions you have with your health care provider.   Document Released: 12/18/2004 Document Revised: 07/05/2012 Document Reviewed: 04/09/2012 Elsevier Interactive Patient Education 2016 Elsevier Inc.  

## 2015-06-11 ENCOUNTER — Other Ambulatory Visit: Payer: Self-pay | Admitting: Cardiology

## 2015-06-12 NOTE — Telephone Encounter (Signed)
REFILL 

## 2015-07-03 ENCOUNTER — Other Ambulatory Visit: Payer: Self-pay | Admitting: Cardiology

## 2015-07-03 NOTE — Telephone Encounter (Signed)
REFILL 

## 2015-07-12 ENCOUNTER — Encounter: Payer: Self-pay | Admitting: Cardiology

## 2015-07-12 ENCOUNTER — Ambulatory Visit (INDEPENDENT_AMBULATORY_CARE_PROVIDER_SITE_OTHER): Payer: Medicare Other | Admitting: Cardiology

## 2015-07-12 VITALS — BP 112/50 | HR 77 | Ht 70.0 in | Wt 179.0 lb

## 2015-07-12 DIAGNOSIS — I2109 ST elevation (STEMI) myocardial infarction involving other coronary artery of anterior wall: Secondary | ICD-10-CM | POA: Diagnosis not present

## 2015-07-12 DIAGNOSIS — I714 Abdominal aortic aneurysm, without rupture, unspecified: Secondary | ICD-10-CM

## 2015-07-12 DIAGNOSIS — I2510 Atherosclerotic heart disease of native coronary artery without angina pectoris: Secondary | ICD-10-CM | POA: Diagnosis not present

## 2015-07-12 DIAGNOSIS — Z8679 Personal history of other diseases of the circulatory system: Secondary | ICD-10-CM

## 2015-07-12 DIAGNOSIS — E785 Hyperlipidemia, unspecified: Secondary | ICD-10-CM | POA: Diagnosis not present

## 2015-07-12 DIAGNOSIS — Z9861 Coronary angioplasty status: Secondary | ICD-10-CM

## 2015-07-12 NOTE — Progress Notes (Signed)
PCP: Leeroy Cha, MD   Clinic Note: Chief Complaint  Patient presents with  . Follow-up    CAD- Ant STEMI, LAD PCI    HPI: [name] is a 82 y.o. male with a PMH below who presents today for annual f/u for CAD . history of anterior stenting in November 2014 with tandem LAD lesions treated with a progress.  He has had a relatively uneventful followup since his MI. He did have some peri-infarct atrial fibrillation but has not had any recurrence - he is therefore not on anticoagulation be on Plavix  [name] was last seen in March 2016  Recent Hospitalizations: n/a  Studies Reviewed:   Echo April 2016: EF 50-55%.  No RWMA.  Gr 1 DD.  ?Thoracid Aortic dilation ~ 3.7.   Interval History: [name] presents today doing quite well. He has not had any significant symptoms. He continues to be very active doing his routine exercises. He also does work at [employer] 2 days a week. He has no major complaints besides some baseline exertional dyspnea that has not changed --> usually made worse this time of year with pollen allergies. Walks or rides stationary bike most days - except in summer, when he does yard work @ the [employer] for 2 days a week.  No chest pain or shortness of breath with rest or exertion.  No PND, orthopnea or edema. --> He does not routinely take Lasix. Only does it very infrequently when necessary. No palpitations, lightheadedness, dizziness, weakness or syncope/near syncope. No TIA/amaurosis fugax symptoms. No claudication.  ROS: A comprehensive was performed. Review of Systems  Constitutional: Negative for malaise/fatigue.  HENT: Positive for congestion. Negative for nosebleeds.   Eyes: Negative for blurred vision.  Respiratory: Positive for shortness of breath (Baseline dyspnea. No change) and wheezing (With the allergies.). Negative for cough.   Cardiovascular: Negative.  Negative for claudication.       Per history of present illness    Gastrointestinal: Negative for blood in stool and melena.  Genitourinary: Negative for hematuria.  Musculoskeletal: Positive for joint pain (No recent gouty flares however). Negative for myalgias and falls.  Neurological: Positive for dizziness (Some occasional positional dizziness) and headaches. Negative for sensory change, speech change, focal weakness, seizures, loss of consciousness and weakness.  Endo/Heme/Allergies: Does not bruise/bleed easily.  Psychiatric/Behavioral: Negative.  Negative for depression and memory loss. The patient is not nervous/anxious and does not have insomnia.   All other systems reviewed and are negative.   Past Medical History  Diagnosis Date  . Prostate disease   . ST elevation myocardial infarction (STEMI) of anterior wall, initial episode of care ([place]) 02/10/2013  . CAD S/P percutaneous coronary angioplasty 02/13/2013    PROXIMAL-MID LAD 95-99% AND 80% TANDEM LESIONS - PROMUS PREMIER DES 2.75 MM X 24 MM (2.9 MM)  . LV dysfunction, EF by echo 02/11/13 45-50% 02/13/2013  . Atrial fibrillation ([place]) 02/13/2013    Peri-infarction  . Arthritis   . Abdominal aortic aneurysm ([place])     being monitored-"checked in Oct. and it stayed the same"  . Tremors of nervous system     both hands-under control  . Macular degeneration of left eye   . 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 Percutaneous coronary stent intervention (pci-s)  02/10/13    PROXIMAL-MID LAD 95-99% AND 80% TANDEM LESIONS - PROMUS PREMIER DES 2.75 MM X  24 MM (2.9 MM)  . Appendectomy  age 36   . Hernia repair Left 2000  . Green light laser turp (transurethral resection of prostate N/A 02/03/2014    Procedure: GREEN LIGHT LASER TURP (TRANSURETHRAL RESECTION OF PROSTATE PHOTO VAPORIZATION;  Surgeon: Festus Aloe, MD;  Location: [facility];  Service: Urology;  Laterality: N/A;  . Cystoscopy w/ retrogrades Bilateral 02/03/2014    Procedure:   BILATERAL RETROGRADE PYELOGRAM;  Surgeon: Festus Aloe, MD;  Location: [facility];  Service: Urology;  Laterality: Bilateral;  . Left heart catheterization with coronary angiogram N/A 02/10/2013    Procedure: LEFT HEART CATHETERIZATION WITH CORONARY ANGIOGRAM;  Surgeon: Leonie Man, MD;  Location: [facility] CATH LAB;  Service: Cardiovascular;  Laterality: N/A;    Prior to Admission medications   Medication Sig Start Date End Date Taking? Authorizing Provider  alfuzosin (UROXATRAL) 10 MG 24 hr tablet Take 10 mg by mouth daily. 06/02/14  Yes Historical Provider, MD  atorvastatin (LIPITOR) 40 MG tablet TAKE 1 TABLET BY MOUTH EVERY DAY 06/12/15  Yes Leonie Man, MD  CINNAMON PO Take 1,000 mg by mouth 2 (two) times daily.   Yes Historical Provider, MD  clopidogrel (PLAVIX) 75 MG tablet Take 1 tablet (75 mg total) by mouth daily. KEEP OV. 07/03/15  Yes Leonie Man, MD  Coenzyme Q10 (Q-10 CO-ENZYME PO) Take 100 mg by mouth every morning.    Yes Historical Provider, MD  Glucosamine-Chondroit-Vit C-Mn (GLUCOSAMINE 1500 COMPLEX PO) Take 1,500 mg by mouth 2 (two) times daily.   Yes Historical Provider, MD  Loratadine (CLARITIN PO) Take 1 tablet by mouth daily.   Yes Historical Provider, MD  LORazepam (ATIVAN) 1 MG tablet Take 0.5 mg by mouth daily as needed for anxiety.    Yes Carlena Sax, MD  loteprednol (ALREX) 0.2 % SUSP Place 1-2 drops into the left eye daily as needed (dryness in eye after injection.).   Yes Historical Provider, MD  Multiple Vitamins-Minerals (ICAPS MV PO) Take 1 capsule by mouth 2 (two) times daily.   Yes Historical Provider, MD  NITROSTAT 0.4 MG SL tablet PLACE 1 TABLET UNDER TONGUE EVERY 5 MINUTES AS NEEDED FOR CHEST PAIN 10/16/14  Yes Erlene Quan, PA-C  primidone (MYSOLINE) 50 MG tablet Take 100 mg by mouth 2 (two) times daily.    Yes Historical Provider, MD  sodium chloride (OCEAN) 0.65 % SOLN nasal spray Place 1 spray into both nostrils every 12 (twelve) hours as needed for  congestion.   Yes Historical Provider, MD   Allergies  Allergen Reactions  . Ciprofloxacin Diarrhea and Nausea Only     Social History   Social History  . Marital Status: Married    Spouse Name: N/A  . Number of Children: N/A  . Years of Education: N/A   Social History Main Topics  . Smoking status: Former Smoker -- 1.00 packs/day for 10 years    Types: Cigarettes    Quit date: 06/07/1983  . Smokeless tobacco: Never Used  . Alcohol Use: 0.0 oz/week    0 Standard drinks or equivalent per week     Comment: social  . Drug Use: No  . Sexual Activity: Not Asked   Other Topics Concern  . None   Social History Narrative   He is married. Retired.   Quit smoking in 1985. Does not drink alcohol.   Currently ready to graduate from cardiac rehabilitation. Plans to sign up for followup program at the local [employer].   History reviewed.  No pertinent family history. No premature CAD.  Mother died @ 84. Brother lived into late 6s.  No DM.  Mother had HTN.   Wt Readings from Last 3 Encounters:  07/12/15 179 lb (81.194 kg)  05/30/15 178 lb (80.74 kg)  11/28/14 172 lb (78.019 kg)    PHYSICAL EXAM BP 112/50 mmHg  Pulse 77  Ht 5\' 10"  (1.778 m)  Wt 179 lb (81.194 kg)  BMI 25.68 kg/m2 General appearance: alert, cooperative, appears stated age, no distress and Well-nourished, well-groomed. Answers questions properly. Pleasant mood and affect HEENT: [place]/AT, EOMI, MMM, anicteric sclera Neck: no adenopathy, no carotid bruit, no JVD, supple, symmetrical, trachea midline and thyroid not enlarged, symmetric, no tenderness/mass/nodules Lungs: clear to auscultation bilaterally, normal percussion bilaterally and Nonlabored, good air movement Heart: regular rate and rhythm, S1, S2 normal, no murmur, click, rub or gallop and normal apical impulse Abdomen: soft, non-tender; bowel sounds normal; no masses, no organomegaly; no bruit Extremities: no edema, redness or tenderness in the calves or  thighs Pulses: 2+ and symmetric Neurologic: Alert and oriented X 3, normal strength and tone. Normal symmetric reflexes. Normal coordination and gait   Adult ECG Report Not checked  Other studies Reviewed: Additional studies/ records that were reviewed today include:  Recent Labs:  Checked by PCP   ASSESSMENT / PLAN: Problem List Items Addressed This Visit    STEMI of anterior wall - 02/10/13 - Primary (Chronic)    No further episodes of angina or any heart failure despite having an anterior wall motion abnormality on echo. There was mild reduced EF but likely recovered I just have not rechecked another one. He remains on stable regimen. Staying active.      Hyperlipidemia with target LDL less than 70 (Chronic)    On statin. Followed by PCP. I don't have labs this should be checked soon since he is due to see his new PCP soon.      H/O atrial fibrillation without current medication - peri-infarct with no recurrence (Chronic)    No evidence of any recurrence since his MI. Likely related to the infarct. For now will maintain a close watch and only think about and regulation if he has recurrence.      CAD S/P Prox-Mid LAD; Promus DES 02/10/13 (Chronic)    Did well as PCI. Maintaining stent patency on Plavix - with appropriate P2Y12 responsiveness with no anginal symptoms. Has not had use any nitroglycerin. He is on statin, but because of low blood pressures he has not been on either beta blocker or ACE inhibitor/ARB.      AAA (abdominal aortic aneurysm)- 4.3 cm by [place] 11/14 (Chronic)    Followed by vascular surgery. Annual surveillance. Stable         Current medicines are reviewed at length with the patient today. (+/- concerns) None The following changes have been made: None Studies Ordered:   No orders of the defined types were placed in this encounter.    Follow-up in one year    Gaspar Fowle, Leonie Green, M.D., M.S. Interventional Cardiologist   Pager #  [phone] Phone # [phone] [address] [place], [place] [address]

## 2015-07-12 NOTE — Patient Instructions (Signed)
Your physician wants you to follow-up in: 1 Year. You will receive a reminder letter in the mail two months in advance. If you don't receive a letter, please call our office to schedule the follow-up appointment.  

## 2015-07-14 ENCOUNTER — Encounter: Payer: Self-pay | Admitting: Cardiology

## 2015-07-14 NOTE — Assessment & Plan Note (Signed)
No further episodes of angina or any heart failure despite having an anterior wall motion abnormality on echo. There was mild reduced EF but likely recovered I just have not rechecked another one. He remains on stable regimen. Staying active.

## 2015-07-15 NOTE — Assessment & Plan Note (Signed)
Did well as PCI. Maintaining stent patency on Plavix - with appropriate P2Y12 responsiveness with no anginal symptoms. Has not had use any nitroglycerin. He is on statin, but because of low blood pressures he has not been on either beta blocker or ACE inhibitor/ARB.

## 2015-07-15 NOTE — Assessment & Plan Note (Addendum)
On statin. Followed by PCP. I don't have labs this should be checked soon since he is due to see his new PCP soon.

## 2015-07-15 NOTE — Assessment & Plan Note (Signed)
Followed by vascular surgery. Annual surveillance. Stable

## 2015-07-15 NOTE — Assessment & Plan Note (Signed)
No evidence of any recurrence since his MI. Likely related to the infarct. For now will maintain a close watch and only think about and regulation if he has recurrence.

## 2015-08-06 ENCOUNTER — Other Ambulatory Visit: Payer: Self-pay | Admitting: Cardiology

## 2015-08-06 NOTE — Telephone Encounter (Signed)
Rx request sent to pharmacy.  

## 2015-09-19 ENCOUNTER — Other Ambulatory Visit: Payer: Self-pay | Admitting: *Deleted

## 2015-09-19 MED ORDER — CLOPIDOGREL BISULFATE 75 MG PO TABS: 75.0000 mg | ORAL_TABLET | Freq: Every day | ORAL | Status: DC

## 2015-09-19 NOTE — Telephone Encounter (Signed)
Rx changed to ninety day supply per patients request.

## 2015-11-29 ENCOUNTER — Encounter: Payer: Self-pay | Admitting: Family

## 2015-12-05 ENCOUNTER — Ambulatory Visit (HOSPITAL_COMMUNITY)
Admission: RE | Admit: 2015-12-05 | Discharge: 2015-12-05 | Disposition: A | Discharge status: Home / Self Care | Payer: Medicare Other | Source: Ambulatory Visit | Attending: Family | Admitting: Family

## 2015-12-05 ENCOUNTER — Encounter: Payer: Self-pay | Admitting: Family

## 2015-12-05 ENCOUNTER — Ambulatory Visit (INDEPENDENT_AMBULATORY_CARE_PROVIDER_SITE_OTHER): Payer: Medicare Other | Admitting: Family

## 2015-12-05 VITALS — BP 104/62 | HR 64 | Temp 98.1°F | Resp 18 | Ht 70.5 in | Wt 177.0 lb

## 2015-12-05 DIAGNOSIS — I714 Abdominal aortic aneurysm, without rupture, unspecified: Secondary | ICD-10-CM

## 2015-12-05 DIAGNOSIS — Z87891 Personal history of nicotine dependence: Secondary | ICD-10-CM

## 2015-12-05 NOTE — Patient Instructions (Signed)
Abdominal Aortic Aneurysm An aneurysm is a weakened or damaged part of an artery wall that bulges from the normal force of blood pumping through the body. An abdominal aortic aneurysm is an aneurysm that occurs in the lower part of the aorta, the main artery of the body.  The major concern with an abdominal aortic aneurysm is that it can enlarge and burst (rupture) or blood can flow between the layers of the wall of the aorta through a tear (aorticdissection). Both of these conditions can cause bleeding inside the body and can be life threatening unless diagnosed and treated promptly. CAUSES  The exact cause of an abdominal aortic aneurysm is unknown. Some contributing factors are:   A hardening of the arteries caused by the buildup of fat and other substances in the lining of a blood vessel (arteriosclerosis).  Inflammation of the walls of an artery (arteritis).   Connective tissue diseases, such as Marfan syndrome.   Abdominal trauma.   An infection, such as syphilis or staphylococcus, in the wall of the aorta (infectious aortitis) caused by bacteria. RISK FACTORS  Risk factors that contribute to an abdominal aortic aneurysm may include:  Age older than 60 years.   High blood pressure (hypertension).  Male gender.  Ethnicity (white race).  Obesity.  Family history of aneurysm (first degree relatives only).  Tobacco use. PREVENTION  The following healthy lifestyle habits may help decrease your risk of abdominal aortic aneurysm:  Quitting smoking. Smoking can raise your blood pressure and cause arteriosclerosis.  Limiting or avoiding alcohol.  Keeping your blood pressure, blood sugar level, and cholesterol levels within normal limits.  Decreasing your salt intake. In somepeople, too much salt can raise blood pressure and increase your risk of abdominal aortic aneurysm.  Eating a diet low in saturated fats and cholesterol.  Increasing your fiber intake by including  whole grains, vegetables, and fruits in your diet. Eating these foods may help lower blood pressure.  Maintaining a healthy weight.  Staying physically active and exercising regularly. SYMPTOMS  The symptoms of abdominal aortic aneurysm may vary depending on the size and rate of growth of the aneurysm.Most grow slowly and do not have any symptoms. When symptoms do occur, they may include:  Pain (abdomen, side, lower back, or groin). The pain may vary in intensity. A sudden onset of severe pain may indicate that the aneurysm has ruptured.  Feeling full after eating only small amounts of food.  Nausea or vomiting or both.  Feeling a pulsating lump in the abdomen.  Feeling faint or passing out. DIAGNOSIS  Since most unruptured abdominal aortic aneurysms have no symptoms, they are often discovered during diagnostic exams for other conditions. An aneurysm may be found during the following procedures:  Ultrasonography (A one-time screening for abdominal aortic aneurysm by ultrasonography is also recommended for all men aged 65-75 years who have ever smoked).  X-ray exams.  A computed tomography (CT).  Magnetic resonance imaging (MRI).  Angiography or arteriography. TREATMENT  Treatment of an abdominal aortic aneurysm depends on the size of your aneurysm, your age, and risk factors for rupture. Medication to control blood pressure and pain may be used to manage aneurysms smaller than 6 cm. Regular monitoring for enlargement may be recommended by your caregiver if:  The aneurysm is 3-4 cm in size (an annual ultrasonography may be recommended).  The aneurysm is 4-4.5 cm in size (an ultrasonography every 6 months may be recommended).  The aneurysm is larger than 4.5 cm in   size (your caregiver may ask that you be examined by a vascular surgeon). If your aneurysm is larger than 6 cm, surgical repair may be recommended. There are two main methods for repair of an aneurysm:   Endovascular  repair (a minimally invasive surgery). This is done most often.  Open repair. This method is used if an endovascular repair is not possible.   This information is not intended to replace advice given to you by your health care provider. Make sure you discuss any questions you have with your health care provider.   Document Released: 12/18/2004 Document Revised: 07/05/2012 Document Reviewed: 04/09/2012 Elsevier Interactive Patient Education 2016 Elsevier Inc.  

## 2015-12-05 NOTE — Progress Notes (Signed)
[facility] [place]   CC: Follow up Abdominal Aortic Aneurysm  History of Present Illness  [name] is a 82 y.o. ([birth year]) male patient of Dr. Donnetta Hutching returns for continued follow-up of infrarenal abdominal aortic aneurysm.  Previous studies demonstrate an AAA, measuring 4.5 cm.  The patient denies back or abdominal pain.  The patient is not a smoker. The patient denies claudication in legs with walking. The patient denies history of stroke or TIA symptoms.  He takes Plavix and a statin.   Pt Diabetic: No Pt smoker: former smoker, quit in 1987   Past Medical History:  Diagnosis Date  . Abdominal aortic aneurysm ([place])    being monitored-"checked in Oct. and it stayed the same"  . Arthritis   . Atrial fibrillation ([place]) 02/13/2013   Peri-infarction  . CAD S/P percutaneous coronary angioplasty 02/13/2013   PROXIMAL-MID LAD 95-99% AND 80% TANDEM LESIONS - PROMUS PREMIER DES 2.75 MM X 24 MM (2.9 MM)  . LV dysfunction, EF by echo 02/11/13 45-50% 02/13/2013  . Macular degeneration of left eye   . Prostate disease   . 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 ST elevation myocardial infarction (STEMI) of anterior wall, initial episode of care ([place]) 02/10/2013  . Tremors of nervous system    both hands-under control   Past Surgical History:  Procedure Laterality Date  . APPENDECTOMY  age 66   . CYSTOSCOPY W/ RETROGRADES Bilateral 02/03/2014   Procedure:  BILATERAL RETROGRADE PYELOGRAM;  Surgeon: Festus Aloe, MD;  Location: [facility];  Service: Urology;  Laterality: Bilateral;  . GREEN LIGHT LASER TURP (TRANSURETHRAL RESECTION OF PROSTATE N/A 02/03/2014   Procedure: GREEN LIGHT LASER TURP (TRANSURETHRAL RESECTION OF PROSTATE PHOTO VAPORIZATION;  Surgeon: Festus Aloe, MD;  Location: [facility];  Service: Urology;  Laterality: N/A;  . HERNIA REPAIR Left 2000  . LEFT HEART CATHETERIZATION WITH CORONARY ANGIOGRAM N/A 02/10/2013   Procedure: LEFT HEART  CATHETERIZATION WITH CORONARY ANGIOGRAM;  Surgeon: Leonie Man, MD;  Location: [facility] CATH LAB;  Service: Cardiovascular;  Laterality: N/A;  . PERCUTANEOUS CORONARY STENT INTERVENTION (PCI-S)  02/10/13   PROXIMAL-MID LAD 95-99% AND 80% TANDEM LESIONS - PROMUS PREMIER DES 2.75 MM X 24 MM (2.9 MM)   Social History Social History   Social History  . Marital status: Married    Spouse name: N/A  . Number of children: N/A  . Years of education: N/A   Occupational History  . Not on file.   Social History Main Topics  . Smoking status: Former Smoker    Packs/day: 1.00    Years: 10.00    Types: Cigarettes    Quit date: 06/07/1983  . Smokeless tobacco: Never Used  . Alcohol use 0.0 oz/week     Comment: social  . Drug use: No  . Sexual activity: Not on file   Other Topics Concern  . Not on file   Social History Narrative   He is married. Retired.   Quit smoking in 1985. Does not drink alcohol.   Currently ready to graduate from cardiac rehabilitation. Plans to sign up for followup program at the local [employer].   Family History No family history on file.  Current Outpatient Prescriptions on File Prior to Visit  Medication Sig Dispense Refill  . alfuzosin (UROXATRAL) 10 MG 24 hr tablet Take 10 mg by mouth daily.  10  . CINNAMON PO Take 1,000 mg by mouth 2 (two) times daily.    Marland Kitchen  clopidogrel (PLAVIX) 75 MG tablet Take 1 tablet (75 mg total) by mouth daily. 90 tablet 3  . Coenzyme Q10 (Q-10 CO-ENZYME PO) Take 100 mg by mouth every morning.     . Glucosamine-Chondroit-Vit C-Mn (GLUCOSAMINE 1500 COMPLEX PO) Take 1,500 mg by mouth 2 (two) times daily.    . Loratadine (CLARITIN PO) Take 1 tablet by mouth daily.    Marland Kitchen loteprednol (ALREX) 0.2 % SUSP Place 1-2 drops into the left eye daily as needed (dryness in eye after injection.).    Marland Kitchen Multiple Vitamins-Minerals (ICAPS MV PO) Take 1 capsule by mouth 2 (two) times daily.    Marland Kitchen NITROSTAT 0.4 MG SL tablet PLACE 1 TABLET UNDER TONGUE EVERY 5  MINUTES AS NEEDED FOR CHEST PAIN 25 tablet 3  . primidone (MYSOLINE) 50 MG tablet Take 100 mg by mouth 2 (two) times daily.     . sodium chloride (OCEAN) 0.65 % SOLN nasal spray Place 1 spray into both nostrils every 12 (twelve) hours as needed for congestion.    Marland Kitchen atorvastatin (LIPITOR) 40 MG tablet TAKE 1 TABLET BY MOUTH EVERY DAY (Patient not taking: Reported on 12/05/2015) 30 tablet 6  . LORazepam (ATIVAN) 1 MG tablet Take 0.5 mg by mouth daily as needed for anxiety.      No current facility-administered medications on file prior to visit.    Allergies  Allergen Reactions  . Ciprofloxacin Diarrhea and Nausea Only    ROS: See HPI for pertinent positives and negatives.  Physical Examination  Vitals:   12/05/15 0918  BP: 104/62  Pulse: 64  Resp: 18  Temp: 98.1 F (36.7 C)  SpO2: 98%  Weight: 177 lb (80.3 kg)  Height: 5' 10.5" (1.791 m)   Body mass index is 25.04 kg/m.  General: A&O x 3, WD.  Pulmonary: Sym exp, respirations are non labored, good air movt, CTAB, no rales, rhonchi, or wheezing.  Cardiac: RRR, Nl S1, S2, no detected murmur.   Carotid Bruits Right Left   Negative Negative   Aorta is not palpable Radial pulses are 2+ palpable and =                          VASCULAR EXAM:                                                                                                                                           LE Pulses Right Left       FEMORAL   palpable   not palpable       POPLITEAL  not palpable  not palpable       POSTERIOR TIBIAL   palpable   faintly palpable       DORSALIS PEDIS      ANTERIOR TIBIAL  palpable  faintly palpable     Gastrointestinal: soft, NTND, -G/R, - HSM, -  masses palpated, - CVAT B.  Musculoskeletal: M/S 5/5 throughout, Extremities without ischemic changes.  Neurologic: CN 2-12 intact, Pain and light touch intact in extremities are intact, Motor exam as listed above.   Non-Invasive Vascular Imaging  AAA Duplex  (12/05/2015)  Previous size: 4.23 cm (Date: 05/30/15)  Current size:  4.22 cm (Date: 12/05/15); right CIA: 1.43 cm x 1.46 cm; left CIA: 1.06 cm x 0.96 cm  Medical Decision Making  The patient is a 82 y.o. male who presents with asymptomatic AAA with no increase in size, 4.22 cm today, 4.23 cm six months prior.   Based on this patient's exam and diagnostic studies, the patient will follow up in 1 year  with the following studies: AAA duplex.  Consideration for repair of AAA would be made when the size is 5.5 cm, growth > 1 cm/yr, and symptomatic status.  I emphasized the importance of maximal medical management including strict control of blood pressure, blood glucose, and lipid levels, antiplatelet agents, obtaining regular exercise, and continued cessation of smoking.   The patient was given information about AAA including signs, symptoms, treatment, and how to minimize the risk of enlargement and rupture of aneurysms.    The patient was advised to call 911 should the patient experience sudden onset abdominal or back pain.   Thank you for allowing [place] to participate in this patient's care.  Clemon Chambers, RN, MSN, FNP-C [facility] [place] Office: [facility] Physician: Early  12/05/2015, 9:44 AM

## 2016-01-04 ENCOUNTER — Other Ambulatory Visit: Payer: Self-pay | Admitting: Cardiology

## 2016-02-27 NOTE — Addendum Note (Signed)
Addended by: Lianne Cure A on: 02/27/2016 12:17 PM   Modules accepted: Orders

## 2016-04-03 ENCOUNTER — Telehealth: Payer: Self-pay | Admitting: Cardiology

## 2016-04-03 NOTE — Telephone Encounter (Signed)
No significant interaction between Plavix, Q10 and cranberries juice. The interaction will affect other anticoagulation medication.    Please avoid grapefruit and grapefruits juice while on Plavix

## 2016-04-03 NOTE — Telephone Encounter (Signed)
Returned call to patient-made aware of pharmacist recommendations:  Raquel Rodriguez-Guzman, [facility]   04/03/16 1:19 PM  Note     No significant interaction between Plavix, Q10 and cranberries juice. The interaction will affect other anticoagulation medication.    Please avoid grapefruit and grapefruits juice while on Plavix      Pt verbalized understanding.

## 2016-04-03 NOTE — Telephone Encounter (Signed)
Pt calling regarding reading that he shouldn't take Q-10 or drink cranberry juice while taking a blood thinner-he takes Plavix-pls advise

## 2016-04-17 DIAGNOSIS — H353221 Exudative age-related macular degeneration, left eye, with active choroidal neovascularization: Secondary | ICD-10-CM | POA: Diagnosis not present

## 2016-04-21 DIAGNOSIS — R3914 Feeling of incomplete bladder emptying: Secondary | ICD-10-CM | POA: Diagnosis not present

## 2016-04-21 DIAGNOSIS — R339 Retention of urine, unspecified: Secondary | ICD-10-CM | POA: Diagnosis not present

## 2016-05-14 DIAGNOSIS — D485 Neoplasm of uncertain behavior of skin: Secondary | ICD-10-CM | POA: Diagnosis not present

## 2016-05-14 DIAGNOSIS — C44712 Basal cell carcinoma of skin of right lower limb, including hip: Secondary | ICD-10-CM | POA: Diagnosis not present

## 2016-05-14 DIAGNOSIS — L570 Actinic keratosis: Secondary | ICD-10-CM | POA: Diagnosis not present

## 2016-05-14 DIAGNOSIS — Z85828 Personal history of other malignant neoplasm of skin: Secondary | ICD-10-CM | POA: Diagnosis not present

## 2016-05-14 DIAGNOSIS — D1801 Hemangioma of skin and subcutaneous tissue: Secondary | ICD-10-CM | POA: Diagnosis not present

## 2016-05-14 DIAGNOSIS — L821 Other seborrheic keratosis: Secondary | ICD-10-CM | POA: Diagnosis not present

## 2016-05-21 DIAGNOSIS — R3914 Feeling of incomplete bladder emptying: Secondary | ICD-10-CM | POA: Diagnosis not present

## 2016-05-21 DIAGNOSIS — R339 Retention of urine, unspecified: Secondary | ICD-10-CM | POA: Diagnosis not present

## 2016-06-03 DIAGNOSIS — H353221 Exudative age-related macular degeneration, left eye, with active choroidal neovascularization: Secondary | ICD-10-CM | POA: Diagnosis not present

## 2016-06-18 DIAGNOSIS — E7800 Pure hypercholesterolemia, unspecified: Secondary | ICD-10-CM | POA: Diagnosis not present

## 2016-06-18 DIAGNOSIS — I714 Abdominal aortic aneurysm, without rupture: Secondary | ICD-10-CM | POA: Diagnosis not present

## 2016-06-18 DIAGNOSIS — G4489 Other headache syndrome: Secondary | ICD-10-CM | POA: Diagnosis not present

## 2016-06-18 DIAGNOSIS — R251 Tremor, unspecified: Secondary | ICD-10-CM | POA: Diagnosis not present

## 2016-06-18 DIAGNOSIS — Z0000 Encounter for general adult medical examination without abnormal findings: Secondary | ICD-10-CM | POA: Diagnosis not present

## 2016-06-18 DIAGNOSIS — R3914 Feeling of incomplete bladder emptying: Secondary | ICD-10-CM | POA: Diagnosis not present

## 2016-06-18 DIAGNOSIS — I10 Essential (primary) hypertension: Secondary | ICD-10-CM | POA: Diagnosis not present

## 2016-06-18 DIAGNOSIS — R339 Retention of urine, unspecified: Secondary | ICD-10-CM | POA: Diagnosis not present

## 2016-06-18 DIAGNOSIS — Z1389 Encounter for screening for other disorder: Secondary | ICD-10-CM | POA: Diagnosis not present

## 2016-06-18 DIAGNOSIS — Z79899 Other long term (current) drug therapy: Secondary | ICD-10-CM | POA: Diagnosis not present

## 2016-06-30 ENCOUNTER — Other Ambulatory Visit: Payer: Self-pay | Admitting: Cardiology

## 2016-07-22 DIAGNOSIS — R339 Retention of urine, unspecified: Secondary | ICD-10-CM | POA: Diagnosis not present

## 2016-07-22 DIAGNOSIS — R3914 Feeling of incomplete bladder emptying: Secondary | ICD-10-CM | POA: Diagnosis not present

## 2016-07-22 DIAGNOSIS — H353221 Exudative age-related macular degeneration, left eye, with active choroidal neovascularization: Secondary | ICD-10-CM | POA: Diagnosis not present

## 2016-07-23 ENCOUNTER — Encounter: Payer: Self-pay | Admitting: Cardiology

## 2016-07-23 ENCOUNTER — Ambulatory Visit (INDEPENDENT_AMBULATORY_CARE_PROVIDER_SITE_OTHER): Payer: PPO | Admitting: Cardiology

## 2016-07-23 VITALS — BP 102/64 | HR 66 | Ht 70.0 in | Wt 181.0 lb

## 2016-07-23 DIAGNOSIS — I2510 Atherosclerotic heart disease of native coronary artery without angina pectoris: Secondary | ICD-10-CM

## 2016-07-23 DIAGNOSIS — I4901 Ventricular fibrillation: Secondary | ICD-10-CM | POA: Diagnosis not present

## 2016-07-23 DIAGNOSIS — R030 Elevated blood-pressure reading, without diagnosis of hypertension: Secondary | ICD-10-CM

## 2016-07-23 DIAGNOSIS — E785 Hyperlipidemia, unspecified: Secondary | ICD-10-CM

## 2016-07-23 DIAGNOSIS — I519 Heart disease, unspecified: Secondary | ICD-10-CM

## 2016-07-23 DIAGNOSIS — I2109 ST elevation (STEMI) myocardial infarction involving other coronary artery of anterior wall: Secondary | ICD-10-CM

## 2016-07-23 DIAGNOSIS — Z9861 Coronary angioplasty status: Secondary | ICD-10-CM | POA: Diagnosis not present

## 2016-07-23 DIAGNOSIS — Z8679 Personal history of other diseases of the circulatory system: Secondary | ICD-10-CM

## 2016-07-23 NOTE — Patient Instructions (Addendum)
NO CHANGE  IN MEDICATIONS   Your physician wants you to follow-up in [place].You will receive a reminder letter in the mail two months in advance. If you don't receive a letter, please call our office to schedule the follow-up appointment.   If you need a refill on your cardiac medications before your next appointment, please call your pharmacy.

## 2016-07-23 NOTE — Progress Notes (Signed)
PCP: Mathews Argyle, MD  Clinic Note: Chief Complaint  Patient presents with  . Follow-up    No chest pain, no shortness of breath, no edema, has cramping in legs at night, no lightheaded or dizziness  . Coronary Artery Disease    HPI: [name] is a 83 y.o. male with a PMH below who presents today for annual f/u for CAD- Ant STEMI.  History of anterior stenting in November 2014 - with VT: PROXIMAL-MID LAD 95-99% AND 80% TANDEM LESIONS -- PCI w/ PROMUS PREMIER DES 2.75 MM X 24 MM (2.9 MM)  STEMI complicated by Afib - no further recurrence  Initial mild ICM - resolved with EF 50-55% (06/2014)  AAA - followed by [facility].  [name] was last seen on July 12, 2015 -->  doing quite well. He has not had any significant symptoms. He continues to be very active doing his routine exercises. He also does work at [employer] 2 days a week. He has no major complaints besides some baseline exertional dyspnea that has not changed --> usually made worse this time of year with pollen allergies.  Walks or rides stationary bike most days - except in summer, when he does yard work @ the [employer] for 2 days a week.  Recent Hospitalizations: n/a  Studies Personally Reviewed - (if available, images/films reviewed: From Epic Chart or Care EveryWhere)  AAA Vascular Duplex: Patent AAA - 4.22 x 4.22 cm - no change from 05/2015  Interval History: [name] presents today "feeling the best he has in years ". He is active as he ever was. He exercises about 30 minutes per day on a stationary bike. He also done does yard work 3-4 days a week. He does not additional day of yard work at [employer]. He does meet whacking up and down over a long driveway with no discomfort. He may get a little bit sore from moving the lawn equipment, but denies any chest tightness or pressure with rest or exertion. As usual, he is a little short of breath when he walks up the hill, but that is expected. Not short of breath  walking downhill.   Cardiovascular Review of Symptoms:  No PND, orthopnea or edema.  No palpitations, lightheadedness, dizziness, weakness or syncope/near syncope. No TIA/amaurosis fugax symptoms. No melena, hematochezia, hematuria, or epstaxis. No claudication.  ROS: A comprehensive was performed. Review of Systems  Constitutional: Negative for malaise/fatigue.  HENT: Positive for congestion.   Respiratory: Positive for cough and shortness of breath (@ baseline - if not improved).   Cardiovascular:       Per HPI  Musculoskeletal: Positive for joint pain. Negative for myalgias.       Occasional rare cramps  Neurological: Positive for dizziness (rare positional ).  Endo/Heme/Allergies: Positive for environmental allergies. Bruises/bleeds easily.  Psychiatric/Behavioral: Negative for depression and memory loss. The patient is not nervous/anxious and does not have insomnia.   All other systems reviewed and are negative.   Past Medical History:  Diagnosis Date  . Abdominal aortic aneurysm ([place])    being monitored-"checked in Oct. and it stayed the same" - 4.22 x 4.22 cm.  (also on Echo - TAA - 3.7 cm)  . Arthritis   . Atrial fibrillation ([place]) 02/13/2013   Peri-infarction  . CAD S/P percutaneous coronary angioplasty 02/13/2013   PROXIMAL-MID LAD 95-99% AND 80% TANDEM LESIONS - PROMUS PREMIER DES 2.75 MM X 24 MM (2.9 MM)  . LV dysfunction, EF by echo  02/11/13 45-50% - 50-55% as of 06/2014 02/13/2013   Echo April 2016: EF 50-55%.  No RWMA.  Gr 1 DD.  ?Thoracid Aortic dilation ~ 3.7.   . Macular degeneration of left eye   . Prostate disease   . 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 ST elevation myocardial infarction (STEMI) of anterior wall, initial episode of care ([place]) 02/10/2013  . Tremors of nervous system    both hands-under control    Past Surgical History:  Procedure Laterality Date  . APPENDECTOMY  age 65   . CYSTOSCOPY W/ RETROGRADES Bilateral 02/03/2014    Procedure:  BILATERAL RETROGRADE PYELOGRAM;  Surgeon: Festus Aloe, MD;  Location: [facility];  Service: Urology;  Laterality: Bilateral;  . GREEN LIGHT LASER TURP (TRANSURETHRAL RESECTION OF PROSTATE N/A 02/03/2014   Procedure: GREEN LIGHT LASER TURP (TRANSURETHRAL RESECTION OF PROSTATE PHOTO VAPORIZATION;  Surgeon: Festus Aloe, MD;  Location: [facility];  Service: Urology;  Laterality: N/A;  . HERNIA REPAIR Left 2000  . LEFT HEART CATHETERIZATION WITH CORONARY ANGIOGRAM N/A 02/10/2013   Procedure: LEFT HEART CATHETERIZATION WITH CORONARY ANGIOGRAM;  Surgeon: Leonie Man, MD;  Location: [facility] CATH LAB;  Service: Cardiovascular;  Laterality: N/A;  . PERCUTANEOUS CORONARY STENT INTERVENTION (PCI-S)  02/10/13   PROXIMAL-MID LAD 95-99% AND 80% TANDEM LESIONS - PROMUS PREMIER DES 2.75 MM X 24 MM (2.9 MM)  . TRANSTHORACIC ECHOCARDIOGRAM  06/2014   EF 50-55%.  No RWMA.  Gr 1 DD.  ?Thoracid Aortic dilation ~ 3.7.     Current Meds  Medication Sig  . alfuzosin (UROXATRAL) 10 MG 24 hr tablet Take 10 mg by mouth daily.  Marland Kitchen atorvastatin (LIPITOR) 20 MG tablet TK 1 T PO QD  . CINNAMON PO Take 1,000 mg by mouth 2 (two) times daily.  . clopidogrel (PLAVIX) 75 MG tablet Take 1 tablet (75 mg total) by mouth daily.  . Coenzyme Q10 (Q-10 CO-ENZYME PO) Take 100 mg by mouth every morning.   . dutasteride (AVODART) 0.5 MG capsule TK 1 C PO QD  . Glucosamine-Chondroit-Vit C-Mn (GLUCOSAMINE 1500 COMPLEX PO) Take 1,500 mg by mouth 2 (two) times daily.  . Loratadine (CLARITIN PO) Take 1 tablet by mouth daily.  Marland Kitchen loteprednol (ALREX) 0.2 % SUSP Place 1-2 drops into the left eye daily as needed (dryness in eye after injection.).  Marland Kitchen Multiple Vitamins-Minerals (ICAPS MV PO) Take 1 capsule by mouth 2 (two) times daily.  . nitroGLYCERIN (NITROSTAT) 0.4 MG SL tablet PLACE ONE TABLET UNDER TONGUE EVERY 5 MINUTES AS NEEDED FOR CHEST PAIN  . primidone (MYSOLINE) 50 MG tablet Take 100 mg by mouth 2 (two) times daily.   .  sodium chloride (OCEAN) 0.65 % SOLN nasal spray Place 1 spray into both nostrils every 12 (twelve) hours as needed for congestion.    Allergies  Allergen Reactions  . Ciprofloxacin Diarrhea and Nausea Only    Social History   Social History  . Marital status: Married    Spouse name: N/A  . Number of children: N/A  . Years of education: N/A   Social History Main Topics  . Smoking status: Former Smoker    Packs/day: 1.00    Years: 10.00    Types: Cigarettes    Quit date: 06/07/1983  . Smokeless tobacco: Never Used  . Alcohol use 0.0 oz/week     Comment: social  . Drug use: No  . Sexual activity: Not Asked   Other Topics Concern  . None  Social History Narrative   He is married. Retired.   Quit smoking in 1985. Does not drink alcohol.   Currently ready to graduate from cardiac rehabilitation. Plans to sign up for followup program at the local [employer].    family history is not on file.  Wt Readings from Last 3 Encounters:  07/23/16 181 lb (82.1 kg)  12/05/15 177 lb (80.3 kg)  07/12/15 179 lb (81.2 kg)    PHYSICAL EXAM BP 102/64   Pulse 66   Ht 5\' 10"  (1.778 m)   Wt 181 lb (82.1 kg)   BMI 25.97 kg/m  General appearance: alert, cooperative, appears stated age, no distress and healthy-appearing. Well-nourished and well-groomed. HEENT: [place]/AT, EOMI, MMM, anicteric sclera Neck: no adenopathy, no carotid bruit and no JVD Lungs: clear to auscultation bilaterally, normal percussion bilaterally and non-labored Heart: regular rate and rhythm, S1 & S2 normal, no murmur, click, rub or gallops; nondisplaced PMI Abdomen: soft, non-tender; bowel sounds normal; no masses,  no organomegaly; no HJR Extremities: extremities normal, atraumatic, no cyanosis, and edema - none Pulses: 2+ and symmetric;  Neurologic: Mental status: Alert, oriented, thought content appropriate; pleasant mood & affect   Adult ECG Report  Rate: 67 ;  Rhythm: normal sinus rhythm and Normal axis, intervals  and durations;   Narrative Interpretation: Normal EKG   Other studies Reviewed: Additional studies/ records that were reviewed today include:  Recent Labs:  Followed by PCP Lab Results  Component Value Date   CHOL 107 06/14/2014   HDL 38 (L) 06/14/2014   LDLCALC 53 06/14/2014   TRIG 81 06/14/2014   CHOLHDL 2.7 06/14/2013     ASSESSMENT / PLAN: Problem List Items Addressed This Visit    Borderline hypertension (Chronic)   CAD S/P Prox-Mid LAD; Promus DES 02/10/13 - Primary (Chronic)    Sizable DES stent in the LAD. Remains on Plavix which I would probably prefer to continue based on having an LAD lesion. No further anginal symptoms. No requirement nitroglycerin. He is on statin but has had essentially normal blood pressures and therefore has not been a tolerate ARB or beta blocker.      H/O atrial fibrillation without current medication - peri-infarct with no recurrence (Chronic)    I think this is probably all peri-infarct and he is not had any recurrence. We continue to monitor for symptoms, but would only exhibit anticoagulation or rate control if he would have recurrence.      Hyperlipidemia with target LDL less than 70 (Chronic)    He is on stable dose of statin with labs monitored by PCP. I importantly do not have the recent labs.      LV dysfunction, EF by echo 02/11/13 45-50% (Chronic)    EF improved by echo. This is probably simply related to revascularization as he is not on ARB or beta blocker. Based on how well he is doing and with preserved EF, I am reluctant to add a medication which could potentially make him less energetic or more likely to be symptomatic.      STEMI of anterior wall - 02/10/13 (Chronic)    Initially he had a pretty significant anterior infarct, acute by ventricle tachycardia in the hospital. He did have anterior wall motion on his initial echo that has essentially recovered back to baseline normal. He is completely asymptomatic at this point with  no active angina or heart failure symptoms. On minimal medications and doing well.      Ventricular fibrillation - occurred en  route to [facility]; CPR & Defib by EMS    No further arrhythmia symptoms. Was likely ischemic. Thankfully no major scar on echo or nuclear.         Current medicines are reviewed at length with the patient today. (+/- concerns) None The following changes have been made: None  Patient Instructions  NO CHANGE  IN MEDICATIONS   Your physician wants you to follow-up in [place].You will receive a reminder letter in the mail two months in advance. If you don't receive a letter, please call our office to schedule the follow-up appointment.   If you need a refill on your cardiac medications before your next appointment, please call your pharmacy.    Studies Ordered:   No orders of the defined types were placed in this encounter.     Glenetta Hew, M.D., M.S. Interventional Cardiologist   Pager # [phone] Phone # [phone] [address] [place], [place] [address]

## 2016-07-24 ENCOUNTER — Encounter: Payer: Self-pay | Admitting: Cardiology

## 2016-07-24 NOTE — Assessment & Plan Note (Signed)
He is on stable dose of statin with labs monitored by PCP. I importantly do not have the recent labs.

## 2016-07-24 NOTE — Assessment & Plan Note (Signed)
EF improved by echo. This is probably simply related to revascularization as he is not on ARB or beta blocker. Based on how well he is doing and with preserved EF, I am reluctant to add a medication which could potentially make him less energetic or more likely to be symptomatic.

## 2016-07-24 NOTE — Assessment & Plan Note (Signed)
Initially he had a pretty significant anterior infarct, acute by ventricle tachycardia in the hospital. He did have anterior wall motion on his initial echo that has essentially recovered back to baseline normal. He is completely asymptomatic at this point with no active angina or heart failure symptoms. On minimal medications and doing well.

## 2016-07-24 NOTE — Assessment & Plan Note (Signed)
No further arrhythmia symptoms. Was likely ischemic. Thankfully no major scar on echo or nuclear.

## 2016-07-24 NOTE — Assessment & Plan Note (Signed)
Sizable DES stent in the LAD. Remains on Plavix which I would probably prefer to continue based on having an LAD lesion. No further anginal symptoms. No requirement nitroglycerin. He is on statin but has had essentially normal blood pressures and therefore has not been a tolerate ARB or beta blocker.

## 2016-07-24 NOTE — Assessment & Plan Note (Signed)
I think this is probably all peri-infarct and he is not had any recurrence. We continue to monitor for symptoms, but would only exhibit anticoagulation or rate control if he would have recurrence.

## 2016-08-22 DIAGNOSIS — R3914 Feeling of incomplete bladder emptying: Secondary | ICD-10-CM | POA: Diagnosis not present

## 2016-08-22 DIAGNOSIS — R339 Retention of urine, unspecified: Secondary | ICD-10-CM | POA: Diagnosis not present

## 2016-09-03 DIAGNOSIS — N312 Flaccid neuropathic bladder, not elsewhere classified: Secondary | ICD-10-CM | POA: Diagnosis not present

## 2016-09-03 DIAGNOSIS — N401 Enlarged prostate with lower urinary tract symptoms: Secondary | ICD-10-CM | POA: Diagnosis not present

## 2016-09-03 DIAGNOSIS — R3911 Hesitancy of micturition: Secondary | ICD-10-CM | POA: Diagnosis not present

## 2016-09-05 DIAGNOSIS — S40861A Insect bite (nonvenomous) of right upper arm, initial encounter: Secondary | ICD-10-CM | POA: Diagnosis not present

## 2016-09-05 DIAGNOSIS — W57XXXA Bitten or stung by nonvenomous insect and other nonvenomous arthropods, initial encounter: Secondary | ICD-10-CM | POA: Diagnosis not present

## 2016-09-11 ENCOUNTER — Other Ambulatory Visit: Payer: Self-pay | Admitting: *Deleted

## 2016-09-11 MED ORDER — NITROGLYCERIN 0.4 MG SL SUBL: 0.4000 mg | SUBLINGUAL_TABLET | SUBLINGUAL | 0 refills | Status: DC | PRN

## 2016-09-11 NOTE — Telephone Encounter (Signed)
Pharmacy requests ninety day. 

## 2016-09-11 NOTE — Telephone Encounter (Signed)
Rx(s) sent to pharmacy electronically.  

## 2016-09-18 DIAGNOSIS — H353221 Exudative age-related macular degeneration, left eye, with active choroidal neovascularization: Secondary | ICD-10-CM | POA: Diagnosis not present

## 2016-09-25 DIAGNOSIS — I252 Old myocardial infarction: Secondary | ICD-10-CM | POA: Diagnosis not present

## 2016-09-25 DIAGNOSIS — I10 Essential (primary) hypertension: Secondary | ICD-10-CM | POA: Diagnosis not present

## 2016-09-25 DIAGNOSIS — R3914 Feeling of incomplete bladder emptying: Secondary | ICD-10-CM | POA: Diagnosis not present

## 2016-09-25 DIAGNOSIS — R339 Retention of urine, unspecified: Secondary | ICD-10-CM | POA: Diagnosis not present

## 2016-09-25 DIAGNOSIS — N400 Enlarged prostate without lower urinary tract symptoms: Secondary | ICD-10-CM | POA: Diagnosis not present

## 2016-09-25 DIAGNOSIS — I25119 Atherosclerotic heart disease of native coronary artery with unspecified angina pectoris: Secondary | ICD-10-CM | POA: Diagnosis not present

## 2016-10-06 ENCOUNTER — Other Ambulatory Visit: Payer: Self-pay | Admitting: *Deleted

## 2016-10-07 MED ORDER — CLOPIDOGREL BISULFATE 75 MG PO TABS: 75.0000 mg | ORAL_TABLET | Freq: Every day | ORAL | 3 refills | Status: DC

## 2016-10-07 NOTE — Telephone Encounter (Signed)
Rx has been sent to the pharmacy electronically. ° °

## 2016-10-28 DIAGNOSIS — R339 Retention of urine, unspecified: Secondary | ICD-10-CM | POA: Diagnosis not present

## 2016-10-28 DIAGNOSIS — R3914 Feeling of incomplete bladder emptying: Secondary | ICD-10-CM | POA: Diagnosis not present

## 2016-11-06 DIAGNOSIS — H353221 Exudative age-related macular degeneration, left eye, with active choroidal neovascularization: Secondary | ICD-10-CM | POA: Diagnosis not present

## 2016-11-06 DIAGNOSIS — H353111 Nonexudative age-related macular degeneration, right eye, early dry stage: Secondary | ICD-10-CM | POA: Diagnosis not present

## 2016-12-08 ENCOUNTER — Ambulatory Visit (INDEPENDENT_AMBULATORY_CARE_PROVIDER_SITE_OTHER): Payer: PPO | Admitting: Family

## 2016-12-08 ENCOUNTER — Ambulatory Visit (HOSPITAL_COMMUNITY)
Admission: RE | Admit: 2016-12-08 | Discharge: 2016-12-08 | Disposition: A | Discharge status: Home / Self Care | Payer: PPO | Source: Ambulatory Visit | Attending: Family | Admitting: Family

## 2016-12-08 ENCOUNTER — Encounter: Payer: Self-pay | Admitting: Family

## 2016-12-08 VITALS — BP 122/65 | HR 67 | Temp 97.2°F | Resp 16 | Ht 70.0 in | Wt 177.0 lb

## 2016-12-08 DIAGNOSIS — I714 Abdominal aortic aneurysm, without rupture, unspecified: Secondary | ICD-10-CM

## 2016-12-08 DIAGNOSIS — Z87891 Personal history of nicotine dependence: Secondary | ICD-10-CM

## 2016-12-08 DIAGNOSIS — R0989 Other specified symptoms and signs involving the circulatory and respiratory systems: Secondary | ICD-10-CM | POA: Diagnosis not present

## 2016-12-08 NOTE — Patient Instructions (Signed)
Abdominal Aortic Aneurysm Blood pumps away from the heart through tubes (blood vessels) called arteries. Aneurysms are weak or damaged places in the wall of an artery. It bulges out like a balloon. An abdominal aortic aneurysm happens in the main artery of the body (aorta). It can burst or tear, causing bleeding inside the body. This is an emergency. It needs treatment right away. What are the causes? The exact cause is unknown. Things that could cause this problem include:  Fat and other substances building up in the lining of a tube.  Swelling of the walls of a blood vessel.  Certain tissue diseases.  Belly (abdominal) trauma.  An infection in the main artery of the body.  What increases the risk? There are things that make it more likely for you to have an aneurysm. These include:  Being over the age of 81 years old.  Having high blood pressure (hypertension).  Being a male.  Being white.  Being very overweight (obese).  Having a family history of aneurysm.  Using tobacco products.  What are the signs or symptoms? Symptoms depend on the size of the aneurysm and how fast it grows. There may not be symptoms. If symptoms occur, they can include:  Pain (belly, side, lower back, or groin).  Feeling full after eating a small amount of food.  Feeling sick to your stomach (nauseous), throwing up (vomiting), or both.  Feeling a lump in your belly that feels like it is beating (pulsating).  Feeling like you will pass out (faint).  How is this treated?  Medicine to control blood pressure and pain.  Imaging tests to see if the aneurysm gets bigger.  Surgery. How is this prevented? To lessen your chance of getting this condition:  Stop smoking. Stop chewing tobacco.  Limit or avoid alcohol.  Keep your blood pressure, blood sugar, and cholesterol within normal limits.  Eat less salt.  Eat foods low in saturated fats and cholesterol. These are found in animal and  whole dairy products.  Eat more fiber. Fiber is found in whole grains, vegetables, and fruits.  Keep a healthy weight.  Stay active and exercise often.  This information is not intended to replace advice given to you by your health care provider. Make sure you discuss any questions you have with your health care provider. Document Released: 07/05/2012 Document Revised: 08/16/2015 Document Reviewed: 04/09/2012 Elsevier Interactive Patient Education  2017 [employer]  

## 2016-12-08 NOTE — Progress Notes (Signed)
[facility] [place]   CC: Follow up Abdominal Aortic Aneurysm  History of Present Illness  [name] is a 83 y.o. (1932/02/13) male patient of Dr. Donnetta Hutching returns for continued follow-up of infrarenal abdominal aortic aneurysm.  Previous studies demonstrate an AAA, measuring 4.5 cm. The patient denies back or abdominal pain. The patient is not a smoker. The patient denies claudication in legs with walking. The patient denies history of stroke or TIA symptoms.  He takes Plavix and a statin.   Pt Diabetic: No Pt smoker: former smoker, quit in 1987   Past Medical History:  Diagnosis Date  . Abdominal aortic aneurysm ([place])    being monitored-"checked in Oct. and it stayed the same" - 4.22 x 4.22 cm.  (also on Echo - TAA - 3.7 cm)  . Arthritis   . Atrial fibrillation ([place]) 02/13/2013   Peri-infarction  . CAD S/P percutaneous coronary angioplasty 02/13/2013   PROXIMAL-MID LAD 95-99% AND 80% TANDEM LESIONS - PROMUS PREMIER DES 2.75 MM X 24 MM (2.9 MM)  . LV dysfunction, EF by echo 02/11/13 45-50% - 50-55% as of 06/2014 02/13/2013   Echo April 2016: EF 50-55%.  No RWMA.  Gr 1 DD.  ?Thoracid Aortic dilation ~ 3.7.   . Macular degeneration of left eye   . Prostate disease   . 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 ST elevation myocardial infarction (STEMI) of anterior wall, initial episode of care ([place]) 02/10/2013  . Tremors of nervous system    both hands-under control   Past Surgical History:  Procedure Laterality Date  . APPENDECTOMY  age 58   . CYSTOSCOPY W/ RETROGRADES Bilateral 02/03/2014   Procedure:  BILATERAL RETROGRADE PYELOGRAM;  Surgeon: Festus Aloe, MD;  Location: [facility];  Service: Urology;  Laterality: Bilateral;  . GREEN LIGHT LASER TURP (TRANSURETHRAL RESECTION OF PROSTATE N/A 02/03/2014   Procedure: GREEN LIGHT LASER TURP (TRANSURETHRAL RESECTION OF PROSTATE PHOTO VAPORIZATION;  Surgeon: Festus Aloe, MD;  Location: [facility];   Service: Urology;  Laterality: N/A;  . HERNIA REPAIR Left 2000  . LEFT HEART CATHETERIZATION WITH CORONARY ANGIOGRAM N/A 02/10/2013   Procedure: LEFT HEART CATHETERIZATION WITH CORONARY ANGIOGRAM;  Surgeon: Leonie Man, MD;  Location: [facility] C[facility] LAB;  Service: Cardiovascular;  Laterality: N/A;  . PERCUTANEOUS CORONARY STENT INTERVENTION (PCI-S)  02/10/13   PROXIMAL-MID LAD 95-99% AND 80% TANDEM LESIONS - PROMUS PREMIER DES 2.75 MM X 24 MM (2.9 MM)  . TRANSTHORACIC ECHOCARDIOGRAM  06/2014   EF 50-55%.  No RWMA.  Gr 1 DD.  ?Thoracid Aortic dilation ~ 3.7.    Social History Social History   Social History  . Marital status: Married    Spouse name: N/A  . Number of children: N/A  . Years of education: N/A   Occupational History  . Not on file.   Social History Main Topics  . Smoking status: Former Smoker    Packs/day: 1.00    Years: 10.00    Types: Cigarettes    Quit date: 06/07/1983  . Smokeless tobacco: Never Used  . Alcohol use 0.0 oz/week     Comment: social  . Drug use: No  . Sexual activity: Not on file   Other Topics Concern  . Not on file   Social History Narrative   He is married. Retired.   Quit smoking in 1985. Does not drink alcohol.   Currently ready to graduate from cardiac rehabilitation. Plans to sign up  for followup program at the local [employer].   Family History No family history on file.  Current Outpatient Prescriptions on File Prior to Visit  Medication Sig Dispense Refill  . alfuzosin (UROXATRAL) 10 MG 24 hr tablet Take 10 mg by mouth daily.  10  . atorvastatin (LIPITOR) 20 MG tablet TK 1 T PO QD  10  . clopidogrel (PLAVIX) 75 MG tablet Take 1 tablet (75 mg total) by mouth daily. 90 tablet 3  . Coenzyme Q10 (Q-10 CO-ENZYME PO) Take 100 mg by mouth every morning.     . dutasteride (AVODART) 0.5 MG capsule TK 1 C PO QD  3  . Glucosamine-Chondroit-Vit C-Mn (GLUCOSAMINE 1500 COMPLEX PO) Take 1,500 mg by mouth 2 (two) times daily.    . Loratadine (CLARITIN  PO) Take 1 tablet by mouth daily.    Marland Kitchen loteprednol (ALREX) 0.2 % SUSP Place 1-2 drops into the left eye daily as needed (dryness in eye after injection.).    Marland Kitchen Multiple Vitamins-Minerals (ICAPS MV PO) Take 1 capsule by mouth 2 (two) times daily.    . nitroGLYCERIN (NITROSTAT) 0.4 MG SL tablet Place 1 tablet (0.4 mg total) under the tongue every 5 (five) minutes as needed for chest pain. X 3 doses 75 tablet 0  . primidone (MYSOLINE) 50 MG tablet Take 100 mg by mouth 2 (two) times daily.     . sodium chloride (OCEAN) 0.65 % SOLN nasal spray Place 1 spray into both nostrils every 12 (twelve) hours as needed for congestion.     No current facility-administered medications on file prior to visit.    Allergies  Allergen Reactions  . Ciprofloxacin Diarrhea and Nausea Only    ROS: See HPI for pertinent positives and negatives.  Physical Examination  Vitals:   12/08/16 0938  BP: 122/65  Pulse: 67  Resp: 16  Temp: (!) 97.2 F (36.2 C)  TempSrc: Oral  SpO2: 95%  Weight: 177 lb (80.3 kg)  Height: 5\' 10"  (1.778 m)   Body mass index is 25.4 kg/m.  General: A&O x 3, WD.  Pulmonary: Sym exp, respirations are non labored, good air movt, CTAB, no rales, rhonchi, or wheezing.  Cardiac: RRR, Nl S1, S2, no detected murmur.   Carotid Bruits Right Left   Negative Negative  Aorta is not palpable Radial pulses are 2+ palpable and =   VASCULAR EXAM:  LE Pulses Right Left  FEMORAL palpable not palpable  POPLITEAL 2+palpable 2+ palpable  POSTERIOR TIBIAL palpable faintly palpable  DORSALIS PEDIS ANTERIOR TIBIAL palpable faintly palpable     Gastrointestinal: soft, NTND, -G/R, - HSM, - masses palpated, - CVAT B.  Musculoskeletal: M/S 5/5 throughout, Extremities without ischemic changes.  Neurologic: CN 2-12 intact, Pain and light touch intact in extremities are intact, Motor exam as listed  above.    Non-Invasive Vascular Imaging  AAA Duplex (12/08/2016)  Previous size: 4.22 cm (Date: 12/05/15); right CIA:1.46 cm; left CIA: 1.06 cm.  Current size:  4.42 cm (Date: 12/08/16); Right CIA: 1.41 cm; Left CIA: 1.08 cm  Medical Decision Making  The patient is a 83 y.o. male who presents with asymptomatic AAA with 2 mm increase in size in a year, to 4.42 cm.  Prominent bilateral popliteal pulses with no claudication sx's, will check bilateral popliteal artery duplex on his return.    Based on this patient's exam and diagnostic studies, the patient will follow up in 1 year  with the following studies: AAA duplex and bilateral popliteal artery duplex.  Consideration for repair of AAA would be made when the size is 5.0 - 5.5 cm, growth > 1 cm/yr, and symptomatic status.        The patient was given information about AAA including signs, symptoms, treatment, and how to minimize the risk of enlargement and rupture of aneurysms.    I emphasized the importance of maximal medical management including strict control of blood pressure, blood glucose, and lipid levels, antiplatelet agents, obtaining regular exercise, and continued cessation of smoking.   The patient was advised to call 911 should the patient experience sudden onset abdominal or back pain.   Thank you for allowing [place] to participate in this patient's care.  Clemon Chambers, RN, MSN, FNP-C [facility] [place] Office: [facility] Physician: Trula Slade  12/08/2016, 9:50 AM

## 2016-12-17 DIAGNOSIS — Z23 Encounter for immunization: Secondary | ICD-10-CM | POA: Diagnosis not present

## 2016-12-17 DIAGNOSIS — I714 Abdominal aortic aneurysm, without rupture: Secondary | ICD-10-CM | POA: Diagnosis not present

## 2016-12-17 DIAGNOSIS — E7800 Pure hypercholesterolemia, unspecified: Secondary | ICD-10-CM | POA: Diagnosis not present

## 2016-12-17 DIAGNOSIS — I10 Essential (primary) hypertension: Secondary | ICD-10-CM | POA: Diagnosis not present

## 2016-12-18 NOTE — Addendum Note (Signed)
Addended by: Lianne Cure A on: 12/18/2016 04:57 PM   Modules accepted: Orders

## 2016-12-31 DIAGNOSIS — H353221 Exudative age-related macular degeneration, left eye, with active choroidal neovascularization: Secondary | ICD-10-CM | POA: Diagnosis not present

## 2017-02-06 DIAGNOSIS — R3914 Feeling of incomplete bladder emptying: Secondary | ICD-10-CM | POA: Diagnosis not present

## 2017-02-06 DIAGNOSIS — R339 Retention of urine, unspecified: Secondary | ICD-10-CM | POA: Diagnosis not present

## 2017-02-10 DIAGNOSIS — H6521 Chronic serous otitis media, right ear: Secondary | ICD-10-CM | POA: Diagnosis not present

## 2017-02-18 DIAGNOSIS — H353221 Exudative age-related macular degeneration, left eye, with active choroidal neovascularization: Secondary | ICD-10-CM | POA: Diagnosis not present

## 2017-02-25 DIAGNOSIS — H6993 Unspecified Eustachian tube disorder, bilateral: Secondary | ICD-10-CM | POA: Diagnosis not present

## 2017-03-10 DIAGNOSIS — J309 Allergic rhinitis, unspecified: Secondary | ICD-10-CM | POA: Diagnosis not present

## 2017-03-23 DIAGNOSIS — Z961 Presence of intraocular lens: Secondary | ICD-10-CM | POA: Diagnosis not present

## 2017-03-23 DIAGNOSIS — H52203 Unspecified astigmatism, bilateral: Secondary | ICD-10-CM | POA: Diagnosis not present

## 2017-03-23 DIAGNOSIS — H353221 Exudative age-related macular degeneration, left eye, with active choroidal neovascularization: Secondary | ICD-10-CM | POA: Diagnosis not present

## 2017-03-23 DIAGNOSIS — H353111 Nonexudative age-related macular degeneration, right eye, early dry stage: Secondary | ICD-10-CM | POA: Diagnosis not present

## 2017-03-29 DIAGNOSIS — R3914 Feeling of incomplete bladder emptying: Secondary | ICD-10-CM | POA: Diagnosis not present

## 2017-03-29 DIAGNOSIS — R339 Retention of urine, unspecified: Secondary | ICD-10-CM | POA: Diagnosis not present

## 2017-04-08 ENCOUNTER — Ambulatory Visit: Payer: PPO | Admitting: Podiatry

## 2017-04-08 VITALS — BP 128/62 | HR 72 | Ht 70.0 in | Wt 180.0 lb

## 2017-04-08 DIAGNOSIS — B351 Tinea unguium: Secondary | ICD-10-CM

## 2017-04-08 DIAGNOSIS — M79676 Pain in unspecified toe(s): Secondary | ICD-10-CM | POA: Diagnosis not present

## 2017-04-08 NOTE — Progress Notes (Signed)
   Subjective:    Patient ID: [name], male    DOB: [birth year], 84 y.o.   MRN: [id number]  HPI  Chief Complaint  Patient presents with  . Nail Problem    bilateral elongated, thickened toenails/ non-diabetic/ Right great toenail is brittle and "crumbly"       Review of Systems  All other systems reviewed and are negative.      Objective:   Physical Exam        Assessment & Plan:

## 2017-04-12 NOTE — Progress Notes (Signed)
   SUBJECTIVE Patient presents to office today complaining of elongated, thickened nails. Pain while ambulating in shoes. Patient is unable to trim their own nails.   Past Medical History:  Diagnosis Date  . Abdominal aortic aneurysm ([place])    being monitored-"checked in Oct. and it stayed the same" - 4.22 x 4.22 cm.  (also on Echo - TAA - 3.7 cm)  . Arthritis   . Atrial fibrillation ([place]) 02/13/2013   Peri-infarction  . CAD S/P percutaneous coronary angioplasty 02/13/2013   PROXIMAL-MID LAD 95-99% AND 80% TANDEM LESIONS - PROMUS PREMIER DES 2.75 MM X 24 MM (2.9 MM)  . LV dysfunction, EF by echo 02/11/13 45-50% - 50-55% as of 06/2014 02/13/2013   Echo April 2016: EF 50-55%.  No RWMA.  Gr 1 DD.  ?Thoracid Aortic dilation ~ 3.7.   . Macular degeneration of left eye   . Prostate disease   . 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 ST elevation myocardial infarction (STEMI) of anterior wall, initial episode of care ([place]) 02/10/2013  . Tremors of nervous system    both hands-under control    OBJECTIVE General Patient is awake, alert, and oriented x 3 and in no acute distress. Derm Skin is dry and supple bilateral. Negative open lesions or macerations. Remaining integument unremarkable. Nails are tender, long, thickened and dystrophic with subungual debris, consistent with onychomycosis, 1-5 bilateral. No signs of infection noted. Vasc  DP and PT pedal pulses palpable bilaterally. Temperature gradient within normal limits.  Neuro Epicritic and protective threshold sensation diminished bilaterally.  Musculoskeletal Exam No symptomatic pedal deformities noted bilateral. Muscular strength within normal limits.  ASSESSMENT 1. Onychodystrophic nails 1-5 bilateral with hyperkeratosis of nails.  2. Onychomycosis of nail due to dermatophyte bilateral 3. Pain in foot bilateral  PLAN OF CARE 1. Patient evaluated today.  2. Instructed to maintain good pedal hygiene and foot care.  3. Mechanical  debridement of nails 1-5 bilaterally performed using a nail nipper. Filed with dremel without incident.  4. Return to clinic in 3 mos.    Edrick Kins, DPM [facility]  Dr. Edrick Kins, [address]                                        [place], [place] [address]                Office [phone]  Fax [phone]

## 2017-04-16 DIAGNOSIS — H353221 Exudative age-related macular degeneration, left eye, with active choroidal neovascularization: Secondary | ICD-10-CM | POA: Diagnosis not present

## 2017-05-06 ENCOUNTER — Encounter: Payer: Self-pay | Admitting: Cardiology

## 2017-05-07 ENCOUNTER — Telehealth: Payer: Self-pay | Admitting: Cardiology

## 2017-05-07 NOTE — Telephone Encounter (Signed)
Called patient and LVM to call back to schedule yearly followup with Dr. Ellyn Hack.

## 2017-05-21 DIAGNOSIS — R3914 Feeling of incomplete bladder emptying: Secondary | ICD-10-CM | POA: Diagnosis not present

## 2017-05-21 DIAGNOSIS — R339 Retention of urine, unspecified: Secondary | ICD-10-CM | POA: Diagnosis not present

## 2017-05-22 DIAGNOSIS — R109 Unspecified abdominal pain: Secondary | ICD-10-CM | POA: Diagnosis not present

## 2017-05-22 DIAGNOSIS — R509 Fever, unspecified: Secondary | ICD-10-CM | POA: Diagnosis not present

## 2017-05-22 DIAGNOSIS — R350 Frequency of micturition: Secondary | ICD-10-CM | POA: Diagnosis not present

## 2017-05-22 DIAGNOSIS — R5383 Other fatigue: Secondary | ICD-10-CM | POA: Diagnosis not present

## 2017-06-10 DIAGNOSIS — H353221 Exudative age-related macular degeneration, left eye, with active choroidal neovascularization: Secondary | ICD-10-CM | POA: Diagnosis not present

## 2017-06-10 DIAGNOSIS — H353111 Nonexudative age-related macular degeneration, right eye, early dry stage: Secondary | ICD-10-CM | POA: Diagnosis not present

## 2017-06-15 DIAGNOSIS — L821 Other seborrheic keratosis: Secondary | ICD-10-CM | POA: Diagnosis not present

## 2017-06-15 DIAGNOSIS — D1801 Hemangioma of skin and subcutaneous tissue: Secondary | ICD-10-CM | POA: Diagnosis not present

## 2017-06-15 DIAGNOSIS — Z85828 Personal history of other malignant neoplasm of skin: Secondary | ICD-10-CM | POA: Diagnosis not present

## 2017-06-15 DIAGNOSIS — L570 Actinic keratosis: Secondary | ICD-10-CM | POA: Diagnosis not present

## 2017-06-17 DIAGNOSIS — R339 Retention of urine, unspecified: Secondary | ICD-10-CM | POA: Diagnosis not present

## 2017-06-17 DIAGNOSIS — N401 Enlarged prostate with lower urinary tract symptoms: Secondary | ICD-10-CM | POA: Diagnosis not present

## 2017-06-18 DIAGNOSIS — R3914 Feeling of incomplete bladder emptying: Secondary | ICD-10-CM | POA: Diagnosis not present

## 2017-06-18 DIAGNOSIS — R339 Retention of urine, unspecified: Secondary | ICD-10-CM | POA: Diagnosis not present

## 2017-06-26 DIAGNOSIS — J3089 Other allergic rhinitis: Secondary | ICD-10-CM | POA: Diagnosis not present

## 2017-06-26 DIAGNOSIS — Z1389 Encounter for screening for other disorder: Secondary | ICD-10-CM | POA: Diagnosis not present

## 2017-06-26 DIAGNOSIS — Z0000 Encounter for general adult medical examination without abnormal findings: Secondary | ICD-10-CM | POA: Diagnosis not present

## 2017-06-26 DIAGNOSIS — I10 Essential (primary) hypertension: Secondary | ICD-10-CM | POA: Diagnosis not present

## 2017-06-26 DIAGNOSIS — Z79899 Other long term (current) drug therapy: Secondary | ICD-10-CM | POA: Diagnosis not present

## 2017-06-26 DIAGNOSIS — E7800 Pure hypercholesterolemia, unspecified: Secondary | ICD-10-CM | POA: Diagnosis not present

## 2017-06-26 DIAGNOSIS — I714 Abdominal aortic aneurysm, without rupture: Secondary | ICD-10-CM | POA: Diagnosis not present

## 2017-06-26 DIAGNOSIS — R05 Cough: Secondary | ICD-10-CM | POA: Diagnosis not present

## 2017-07-08 ENCOUNTER — Ambulatory Visit: Payer: PPO | Admitting: Podiatry

## 2017-07-08 ENCOUNTER — Encounter: Payer: Self-pay | Admitting: Podiatry

## 2017-07-08 DIAGNOSIS — Q828 Other specified congenital malformations of skin: Secondary | ICD-10-CM | POA: Diagnosis not present

## 2017-07-08 DIAGNOSIS — M79676 Pain in unspecified toe(s): Secondary | ICD-10-CM

## 2017-07-08 DIAGNOSIS — B351 Tinea unguium: Secondary | ICD-10-CM

## 2017-07-13 DIAGNOSIS — R05 Cough: Secondary | ICD-10-CM | POA: Diagnosis not present

## 2017-07-14 NOTE — Progress Notes (Signed)
    Subjective: Patient is a 84 y.o. male presenting to the office today with a chief complaint of a painful callus lesion to the left foot that has been present for a few weeks. Applying pressure and bearing weight increases the pain. He has not had any treatment for the symptoms.   Patient also complains of elongated, thickened nails that cause pain while ambulating in shoes. He is unable to trim his own nails. Patient presents today for further treatment and evaluation.  Past Medical History:  Diagnosis Date  . Abdominal aortic aneurysm ([place])    being monitored-"checked in Oct. and it stayed the same" - 4.22 x 4.22 cm.  (also on Echo - TAA - 3.7 cm)  . Arthritis   . Atrial fibrillation ([place]) 02/13/2013   Peri-infarction  . CAD S/P percutaneous coronary angioplasty 02/13/2013   PROXIMAL-MID LAD 95-99% AND 80% TANDEM LESIONS - PROMUS PREMIER DES 2.75 MM X 24 MM (2.9 MM)  . LV dysfunction, EF by echo 02/11/13 45-50% - 50-55% as of 06/2014 02/13/2013   Echo April 2016: EF 50-55%.  No RWMA.  Gr 1 DD.  ?Thoracid Aortic dilation ~ 3.7.   . Macular degeneration of left eye   . Prostate disease   . 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 ST elevation myocardial infarction (STEMI) of anterior wall, initial episode of care ([place]) 02/10/2013  . Tremors of nervous system    both hands-under control    Objective:  Physical Exam General: Alert and oriented x3 in no acute distress  Dermatology: Hyperkeratotic lesion present on the left foot. Pain on palpation with a central nucleated core noted. Skin is warm, dry and supple bilateral lower extremities. Negative for open lesions or macerations. Nails are tender, long, thickened and dystrophic with subungual debris, consistent with onychomycosis, 1-5 bilateral. No signs of infection noted.  Vascular: Palpable pedal pulses bilaterally. No edema or erythema noted. Capillary refill within normal limits.  Neurological: Epicritic and protective  threshold grossly intact bilaterally.   Musculoskeletal Exam: Pain on palpation at the keratotic lesion noted. Range of motion within normal limits bilateral. Muscle strength 5/5 in all groups bilateral.  Assessment: 1. Onychodystrophic nails 1-5 bilateral with hyperkeratosis of nails.  2. Onychomycosis of nail due to dermatophyte bilateral 3. Porokeratosis x 1 noted to the left foot   Plan of Care:  1. Patient evaluated. 2. Excisional debridement of keratoic lesion using a chisel blade was performed without incident.  3. Dressed with light dressing. 4. Mechanical debridement of nails 1-5 bilaterally performed using a nail nipper. Filed with dremel without incident.  5. Patient is to return to the clinic in 5 months.   Edrick Kins, DPM [facility]  Dr. Edrick Kins, [address]                                        [place], [place] [address]                Office [phone]  Fax [phone]

## 2017-07-20 DIAGNOSIS — R339 Retention of urine, unspecified: Secondary | ICD-10-CM | POA: Diagnosis not present

## 2017-07-20 DIAGNOSIS — R3914 Feeling of incomplete bladder emptying: Secondary | ICD-10-CM | POA: Diagnosis not present

## 2017-07-27 DIAGNOSIS — W57XXXA Bitten or stung by nonvenomous insect and other nonvenomous arthropods, initial encounter: Secondary | ICD-10-CM | POA: Diagnosis not present

## 2017-07-27 DIAGNOSIS — S30861A Insect bite (nonvenomous) of abdominal wall, initial encounter: Secondary | ICD-10-CM | POA: Diagnosis not present

## 2017-08-05 DIAGNOSIS — H353221 Exudative age-related macular degeneration, left eye, with active choroidal neovascularization: Secondary | ICD-10-CM | POA: Diagnosis not present

## 2017-08-19 ENCOUNTER — Encounter: Payer: Self-pay | Admitting: Cardiology

## 2017-08-19 ENCOUNTER — Ambulatory Visit: Payer: PPO | Admitting: Cardiology

## 2017-08-19 VITALS — BP 142/68 | HR 72 | Ht 70.0 in | Wt 178.8 lb

## 2017-08-19 DIAGNOSIS — I2109 ST elevation (STEMI) myocardial infarction involving other coronary artery of anterior wall: Secondary | ICD-10-CM | POA: Diagnosis not present

## 2017-08-19 DIAGNOSIS — Z9861 Coronary angioplasty status: Secondary | ICD-10-CM | POA: Diagnosis not present

## 2017-08-19 DIAGNOSIS — E785 Hyperlipidemia, unspecified: Secondary | ICD-10-CM

## 2017-08-19 DIAGNOSIS — I519 Heart disease, unspecified: Secondary | ICD-10-CM

## 2017-08-19 DIAGNOSIS — R030 Elevated blood-pressure reading, without diagnosis of hypertension: Secondary | ICD-10-CM

## 2017-08-19 DIAGNOSIS — Z8679 Personal history of other diseases of the circulatory system: Secondary | ICD-10-CM | POA: Diagnosis not present

## 2017-08-19 DIAGNOSIS — I2510 Atherosclerotic heart disease of native coronary artery without angina pectoris: Secondary | ICD-10-CM | POA: Diagnosis not present

## 2017-08-19 NOTE — Patient Instructions (Signed)
NO CHANGES WITH CURRENT MEDICATION     MONITOR BLOOD PRESSURE -- IF  RANGES IN 140/90'S OR HIGHER CONSISTENTLY CONTACT OFFICE. MAY INCREASE MEDICATION.  Your physician wants you to follow-up in [place].You will receive a reminder letter in the mail two months in advance. If you don't receive a letter, please call our office to schedule the follow-up appointment.    If you need a refill on your cardiac medications before your next appointment, please call your pharmacy.

## 2017-08-19 NOTE — Progress Notes (Signed)
PCP: Cameron Manes, MD  Clinic Note: Chief Complaint  Patient presents with  . Follow-up    No complaints  . Coronary Artery Disease    No angina    HPI: [name] is a 84 y.o. male with a PMH below who presents today for annual f/u - CAD-PCI (Ant STEMI).  History of anterior STEMI in November 2014 - with VT: PROXIMAL-MID LAD 95-99% AND 80% TANDEM LESIONS -- PCI w/ PROMUS PREMIER DES 2.75 MM X 24 MM (2.9 MM)  STEMI complicated by Afib - no further recurrence  Initial mild ICM - resolved with EF 50-55% (06/2014) AAA - followed by [facility].  [name] was last seen in May 2018.  He was doing well at that time as well.  Indicated that he was feeling the "best he had felt in years ".  Exercising 30 minutes a day on a stationary bike and doing yard work all day at least 3 to 4 days a week.   Recent Hospitalizations: None  Studies Personally Reviewed - (if available, images/films reviewed: From Epic Chart or Care Everywhere)  n/a  Interval History: [name] again presents here today feeling quite well with no major complaints.  He remains very active and exercising several days a week as well as doing yard work. With exception of  Having a cold at this present time (he took some cold medicine that he thinks may have had Sudafed in it) he has no major complaints.  He has not had any cardiac symptoms in years.  He did have some left arm pain but thinks it may be more related to a injury while pulling his garbage cans up.  Cardiac review of symptoms is negative as noted: No chest pain or shortness of breath with rest or exertion. No PND, orthopnea or edema. No palpitations, lightheadedness, dizziness, weakness or syncope/near syncope. No TIA/amaurosis fugax symptoms. No melena, hematochezia, hematuria, or epstaxis. No claudication.  ROS: A comprehensive was performed. Review of Systems  Constitutional: Negative for chills, fever and malaise/fatigue.  Respiratory: Positive for  cough and wheezing. Negative for sputum production.        Says that he has a "summer cold"  Endo/Heme/Allergies: Positive for environmental allergies. Does not bruise/bleed easily.  All other systems reviewed and are negative.  I have reviewed and (if needed) personally updated the patient's problem list, medications, allergies, past medical and surgical history, social and family history.   Past Medical History:  Diagnosis Date  . Abdominal aortic aneurysm ([place])    being monitored-"checked in Oct. and it stayed the same" - 4.22 x 4.22 cm.  (also on Echo - TAA - 3.7 cm)  . Arthritis   . Atrial fibrillation ([place]) 02/13/2013   Peri-infarction  . CAD S/P percutaneous coronary angioplasty 02/13/2013   PROXIMAL-MID LAD 95-99% AND 80% TANDEM LESIONS - PROMUS PREMIER DES 2.75 MM X 24 MM (2.9 MM)  . LV dysfunction, EF by echo 02/11/13 45-50% - 50-55% as of 06/2014 02/13/2013   Echo April 2016: EF 50-55%.  No RWMA.  Gr 1 DD.  ?Thoracid Aortic dilation ~ 3.7.   . Macular degeneration of left eye   . Prostate disease   . 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 ST elevation myocardial infarction (STEMI) of anterior wall, initial episode of care ([place]) 02/10/2013  . Tremors of nervous system    both hands-under control    Past Surgical History:  Procedure Laterality Date  .  APPENDECTOMY  age 74   . CYSTOSCOPY W/ RETROGRADES Bilateral 02/03/2014   Procedure:  BILATERAL RETROGRADE PYELOGRAM;  Surgeon: Cameron Aloe, MD;  Location: [facility];  Service: Urology;  Laterality: Bilateral;  . GREEN LIGHT LASER TURP (TRANSURETHRAL RESECTION OF PROSTATE N/A 02/03/2014   Procedure: GREEN LIGHT LASER TURP (TRANSURETHRAL RESECTION OF PROSTATE PHOTO VAPORIZATION;  Surgeon: Cameron Aloe, MD;  Location: [facility];  Service: Urology;  Laterality: N/A;  . HERNIA REPAIR Left 2000  . LEFT HEART CATHETERIZATION WITH CORONARY ANGIOGRAM N/A 02/10/2013   Procedure: LEFT HEART CATHETERIZATION WITH CORONARY  ANGIOGRAM;  Surgeon: Cameron Man, MD;  Location: [facility] CATH LAB;  Service: Cardiovascular;  Laterality: N/A;  . PERCUTANEOUS CORONARY STENT INTERVENTION (PCI-S)  02/10/13   PROXIMAL-MID LAD 95-99% AND 80% TANDEM LESIONS - PROMUS PREMIER DES 2.75 MM X 24 MM (2.9 MM)  . TRANSTHORACIC ECHOCARDIOGRAM  06/2014   EF 50-55%.  No RWMA.  Gr 1 DD.  ?Thoracid Aortic dilation ~ 3.7.     Current Meds  Medication Sig  . alfuzosin (UROXATRAL) 10 MG 24 hr tablet Take 10 mg by mouth daily.  Marland Kitchen atorvastatin (LIPITOR) 20 MG tablet TK 1 T PO QD  . clopidogrel (PLAVIX) 75 MG tablet Take 1 tablet (75 mg total) by mouth daily.  . Coenzyme Q10 (Q-10 CO-ENZYME PO) Take 100 mg by mouth every morning.   . dutasteride (AVODART) 0.5 MG capsule TK 1 C PO QD  . Glucosamine-Chondroit-Vit C-Mn (GLUCOSAMINE 1500 COMPLEX PO) Take 1,500 mg by mouth 2 (two) times daily.  . Loratadine (CLARITIN PO) Take 1 tablet by mouth daily.  Marland Kitchen loteprednol (ALREX) 0.2 % SUSP Place 1-2 drops into the left eye daily as needed (dryness in eye after injection.).  Marland Kitchen Multiple Vitamins-Minerals (ICAPS MV PO) Take 1 capsule by mouth 2 (two) times daily.  . nitroGLYCERIN (NITROSTAT) 0.4 MG SL tablet Place 1 tablet (0.4 mg total) under the tongue every 5 (five) minutes as needed for chest pain. X 3 doses  . primidone (MYSOLINE) 50 MG tablet Take 100 mg by mouth 2 (two) times daily.   . sodium chloride (OCEAN) 0.65 % SOLN nasal spray Place 1 spray into both nostrils every 12 (twelve) hours as needed for congestion.    Allergies  Allergen Reactions  . Ciprofloxacin Diarrhea and Nausea Only    Social History   Tobacco Use  . Smoking status: Former Smoker    Packs/day: 1.00    Years: 10.00    Pack years: 10.00    Types: Cigarettes    Last attempt to quit: 06/07/1983    Years since quitting: 34.2  . Smokeless tobacco: Never Used  Substance Use Topics  . Alcohol use: Yes    Alcohol/week: 0.0 oz    Comment: social  . Drug use: No   Social  History   Social History Narrative   He is married. Retired.   Quit smoking in 1985. Does not drink alcohol.   Currently ready to graduate from cardiac rehabilitation. Plans to sign up for followup program at the local [employer].    family history is not on file.  Wt Readings from Last 3 Encounters:  08/19/17 178 lb 12.8 oz (81.1 kg)  04/08/17 180 lb (81.6 kg)  12/08/16 177 lb (80.3 kg)    PHYSICAL EXAM BP (!) 142/68   Pulse 72   Ht 5\' 10"  (1.778 m)   Wt 178 lb 12.8 oz (81.1 kg)   BMI 25.66 kg/m  Physical Exam  Constitutional:  He is oriented to person, place, and time. He appears well-developed and well-nourished. No distress.  Healthy-appearing.  Well-groomed.  HENT:  Head: Normocephalic and atraumatic.  Wears glasses  Eyes: EOM are normal.  Neck: Normal range of motion. Neck supple. No hepatojugular reflux and no JVD present. Carotid bruit is not present.  Cardiovascular: Normal rate, regular rhythm and normal pulses.  No extrasystoles are present. PMI is not displaced. Exam reveals no gallop and no friction rub.  No murmur heard. Pulmonary/Chest: Effort normal and breath sounds normal. No respiratory distress. He has no wheezes. He has no rales. He exhibits no tenderness.  Mild upper respiratory congestion clears with cough  Abdominal: Soft. Bowel sounds are normal. He exhibits no distension. There is no tenderness. There is no rebound.  No HSM or HJR  Musculoskeletal: Normal range of motion. He exhibits no edema.  Neurological: He is alert and oriented to person, place, and time.  Psychiatric: He has a normal mood and affect. His behavior is normal. Judgment and thought content normal.  Vitals reviewed.    Adult ECG Report  Rate: 72 ;  Rhythm: normal sinus rhythm and Left atrial enlargement.  Otherwise normal axis, intervals and durations.;   Narrative Interpretation: Relatively normal EKG   Other studies Reviewed: Additional studies/ records that were reviewed today  include:  Recent Labs: From April 2019: Total cholesterol 118, LDL 54, triglycerides 142, HDL 35.  BUN/creatinine 17/1.14.  ASSESSMENT / PLAN: Problem List Items Addressed This Visit    STEMI of anterior wall - 02/10/13 (Chronic)    Significant anterior infarct comp gated by VT.  Did have initial cardiomyopathy that improved.  Has had no further angina after initial PCI.  No heart failure symptoms either.      Relevant Orders   EKG 12-Lead (Completed)   LV dysfunction, EF by echo 02/11/13 45-50% (Chronic)    EF did improve almost back to baseline normal after PCI.  He did not tolerate ARB or beta-blocker due to hypotension. With him being totally asymptomatic and has plenty of energy, I would be reluctant on adding any medications which could potentially make him more fatigued. His blood pressure little elevated today, but this is not really his norm.  He does think that it may be related to his cold medicine this morning.  We can follow closely.      Relevant Orders   EKG 12-Lead (Completed)   Hyperlipidemia with target LDL less than 70 (Chronic)    On moderate dose statin, Lipids look good on recent check.  LDL close to 50 which would be goal.  No change.      H/O atrial fibrillation without current medication - peri-infarct with no recurrence (Chronic)    No recurrence since MI.      CAD S/P Prox-Mid LAD; Promus DES 02/10/13 - Primary (Chronic)    He has a quite sizable DES stent in the LAD.  However given the very proximal location he remains on Plavix alone without aspirin.  He is on atorvastatin.  But remains asymptomatic on no beta-blocker or ARB (not tolerated, mostly because of hypotension)      Relevant Orders   EKG 12-Lead (Completed)   Borderline hypertension (Chronic)    Did not tolerate beta-blocker because of fatigue and hypotension in the past.  Also, did not tolerate ARB. Blood pressure is little bit elevated today, but is usually not at this level.  Would prefer  for him to have borderline pressures and if he  is a symptomatically currently is.        I spent a total of 25 minutes with the patient and chart review. >  50% of the time was spent in direct patient consultation.   Current medicines are reviewed at length with the patient today.  (+/- concerns) none The following changes have been made:  None  Patient Instructions  NO CHANGES WITH CURRENT MEDICATION     MONITOR BLOOD PRESSURE -- IF  RANGES IN 140/90'S OR HIGHER CONSISTENTLY CONTACT OFFICE. MAY INCREASE MEDICATION.  Your physician wants you to follow-up in [place].You will receive a reminder letter in the mail two months in advance. If you don't receive a letter, please call our office to schedule the follow-up appointment.    If you need a refill on your cardiac medications before your next appointment, please call your pharmacy.     Studies Ordered:   Orders Placed This Encounter  Procedures  . EKG 12-Lead      Glenetta Hew, M.D., M.S. Interventional Cardiologist   Pager # [phone] Phone # [phone] [address] [place], [place] [address]   Thank you for choosing Heartcare at [facility]!!

## 2017-08-22 NOTE — Assessment & Plan Note (Signed)
Significant anterior infarct comp gated by VT.  Did have initial cardiomyopathy that improved.  Has had no further angina after initial PCI.  No heart failure symptoms either.

## 2017-08-22 NOTE — Assessment & Plan Note (Addendum)
On moderate dose statin, Lipids look good on recent check.  LDL close to 50 which would be goal.  No change.

## 2017-08-22 NOTE — Assessment & Plan Note (Signed)
EF did improve almost back to baseline normal after PCI.  He did not tolerate ARB or beta-blocker due to hypotension. With him being totally asymptomatic and has plenty of energy, I would be reluctant on adding any medications which could potentially make him more fatigued. His blood pressure little elevated today, but this is not really his norm.  He does think that it may be related to his cold medicine this morning.  We can follow closely.

## 2017-08-22 NOTE — Assessment & Plan Note (Signed)
He has a quite sizable DES stent in the LAD.  However given the very proximal location he remains on Plavix alone without aspirin.  He is on atorvastatin.  But remains asymptomatic on no beta-blocker or ARB (not tolerated, mostly because of hypotension)

## 2017-08-22 NOTE — Assessment & Plan Note (Signed)
Did not tolerate beta-blocker because of fatigue and hypotension in the past.  Also, did not tolerate ARB. Blood pressure is little bit elevated today, but is usually not at this level.  Would prefer for him to have borderline pressures and if he is a symptomatically currently is.

## 2017-08-22 NOTE — Assessment & Plan Note (Signed)
No recurrence since MI.

## 2017-08-26 DIAGNOSIS — R339 Retention of urine, unspecified: Secondary | ICD-10-CM | POA: Diagnosis not present

## 2017-08-26 DIAGNOSIS — R3914 Feeling of incomplete bladder emptying: Secondary | ICD-10-CM | POA: Diagnosis not present

## 2017-09-14 DIAGNOSIS — R3912 Poor urinary stream: Secondary | ICD-10-CM | POA: Diagnosis not present

## 2017-09-14 DIAGNOSIS — N401 Enlarged prostate with lower urinary tract symptoms: Secondary | ICD-10-CM | POA: Diagnosis not present

## 2017-09-25 DIAGNOSIS — R3914 Feeling of incomplete bladder emptying: Secondary | ICD-10-CM | POA: Diagnosis not present

## 2017-09-25 DIAGNOSIS — R339 Retention of urine, unspecified: Secondary | ICD-10-CM | POA: Diagnosis not present

## 2017-09-30 DIAGNOSIS — H353221 Exudative age-related macular degeneration, left eye, with active choroidal neovascularization: Secondary | ICD-10-CM | POA: Diagnosis not present

## 2017-10-20 ENCOUNTER — Other Ambulatory Visit: Payer: Self-pay

## 2017-10-20 MED ORDER — CLOPIDOGREL BISULFATE 75 MG PO TABS: 75.0000 mg | ORAL_TABLET | Freq: Every day | ORAL | 2 refills | Status: DC

## 2017-11-25 DIAGNOSIS — H353112 Nonexudative age-related macular degeneration, right eye, intermediate dry stage: Secondary | ICD-10-CM | POA: Diagnosis not present

## 2017-11-25 DIAGNOSIS — H353221 Exudative age-related macular degeneration, left eye, with active choroidal neovascularization: Secondary | ICD-10-CM | POA: Diagnosis not present

## 2017-11-26 ENCOUNTER — Other Ambulatory Visit: Payer: Self-pay | Admitting: Cardiology

## 2017-11-26 DIAGNOSIS — I25119 Atherosclerotic heart disease of native coronary artery with unspecified angina pectoris: Secondary | ICD-10-CM | POA: Diagnosis not present

## 2017-11-26 DIAGNOSIS — I10 Essential (primary) hypertension: Secondary | ICD-10-CM | POA: Diagnosis not present

## 2017-11-26 DIAGNOSIS — M189 Osteoarthritis of first carpometacarpal joint, unspecified: Secondary | ICD-10-CM | POA: Diagnosis not present

## 2017-11-26 DIAGNOSIS — N400 Enlarged prostate without lower urinary tract symptoms: Secondary | ICD-10-CM | POA: Diagnosis not present

## 2017-12-02 DIAGNOSIS — R339 Retention of urine, unspecified: Secondary | ICD-10-CM | POA: Diagnosis not present

## 2017-12-02 DIAGNOSIS — R3914 Feeling of incomplete bladder emptying: Secondary | ICD-10-CM | POA: Diagnosis not present

## 2017-12-09 ENCOUNTER — Ambulatory Visit: Payer: PPO | Admitting: Podiatry

## 2017-12-28 DIAGNOSIS — I10 Essential (primary) hypertension: Secondary | ICD-10-CM | POA: Diagnosis not present

## 2017-12-28 DIAGNOSIS — M79674 Pain in right toe(s): Secondary | ICD-10-CM | POA: Diagnosis not present

## 2017-12-28 DIAGNOSIS — Z23 Encounter for immunization: Secondary | ICD-10-CM | POA: Diagnosis not present

## 2017-12-28 DIAGNOSIS — R0789 Other chest pain: Secondary | ICD-10-CM | POA: Diagnosis not present

## 2017-12-28 DIAGNOSIS — R51 Headache: Secondary | ICD-10-CM | POA: Diagnosis not present

## 2017-12-28 DIAGNOSIS — Z79899 Other long term (current) drug therapy: Secondary | ICD-10-CM | POA: Diagnosis not present

## 2017-12-29 DIAGNOSIS — I25119 Atherosclerotic heart disease of native coronary artery with unspecified angina pectoris: Secondary | ICD-10-CM | POA: Diagnosis not present

## 2017-12-29 DIAGNOSIS — M189 Osteoarthritis of first carpometacarpal joint, unspecified: Secondary | ICD-10-CM | POA: Diagnosis not present

## 2017-12-29 DIAGNOSIS — N400 Enlarged prostate without lower urinary tract symptoms: Secondary | ICD-10-CM | POA: Diagnosis not present

## 2017-12-29 DIAGNOSIS — I10 Essential (primary) hypertension: Secondary | ICD-10-CM | POA: Diagnosis not present

## 2018-01-01 DIAGNOSIS — R3914 Feeling of incomplete bladder emptying: Secondary | ICD-10-CM | POA: Diagnosis not present

## 2018-01-01 DIAGNOSIS — R339 Retention of urine, unspecified: Secondary | ICD-10-CM | POA: Diagnosis not present

## 2018-01-06 DIAGNOSIS — H906 Mixed conductive and sensorineural hearing loss, bilateral: Secondary | ICD-10-CM | POA: Diagnosis not present

## 2018-01-06 DIAGNOSIS — J300 Vasomotor rhinitis: Secondary | ICD-10-CM | POA: Diagnosis not present

## 2018-01-06 DIAGNOSIS — H6521 Chronic serous otitis media, right ear: Secondary | ICD-10-CM | POA: Diagnosis not present

## 2018-01-13 DIAGNOSIS — H353221 Exudative age-related macular degeneration, left eye, with active choroidal neovascularization: Secondary | ICD-10-CM | POA: Diagnosis not present

## 2018-01-14 ENCOUNTER — Encounter (HOSPITAL_COMMUNITY): Payer: PPO

## 2018-01-14 ENCOUNTER — Ambulatory Visit: Payer: PPO | Admitting: Family

## 2018-01-14 ENCOUNTER — Other Ambulatory Visit (HOSPITAL_COMMUNITY): Payer: PPO

## 2018-01-18 ENCOUNTER — Encounter: Payer: Self-pay | Admitting: Family

## 2018-01-18 ENCOUNTER — Ambulatory Visit (HOSPITAL_COMMUNITY)
Admission: RE | Admit: 2018-01-18 | Discharge: 2018-01-18 | Disposition: A | Discharge status: Home / Self Care | Payer: PPO | Source: Ambulatory Visit | Attending: Family | Admitting: Family

## 2018-01-18 ENCOUNTER — Ambulatory Visit: Payer: PPO | Admitting: Family

## 2018-01-18 ENCOUNTER — Ambulatory Visit (INDEPENDENT_AMBULATORY_CARE_PROVIDER_SITE_OTHER)
Admission: RE | Admit: 2018-01-18 | Discharge: 2018-01-18 | Disposition: A | Discharge status: Home / Self Care | Payer: PPO | Source: Ambulatory Visit | Attending: Family | Admitting: Family

## 2018-01-18 VITALS — BP 125/69 | HR 66 | Temp 97.0°F | Resp 17 | Ht 70.0 in | Wt 179.0 lb

## 2018-01-18 DIAGNOSIS — R0989 Other specified symptoms and signs involving the circulatory and respiratory systems: Secondary | ICD-10-CM

## 2018-01-18 DIAGNOSIS — Z87891 Personal history of nicotine dependence: Secondary | ICD-10-CM

## 2018-01-18 DIAGNOSIS — I714 Abdominal aortic aneurysm, without rupture, unspecified: Secondary | ICD-10-CM

## 2018-01-18 NOTE — Progress Notes (Addendum)
[facility] [place]   CC: Follow up Abdominal Aortic Aneurysm  History of Present Illness  [name] is a 84 y.o. ([birth year]) male whom Dr. Donnetta Hutching has been monitoring for an infrarenal abdominal aortic aneurysm.  Previous studies demonstrate an AAA, measuring 4.5 cm. The patient denies back or abdominal pain. The patient is not a smoker. He denies claudication type symtoms in his legs with walking. He denies any history of stroke or TIA symptoms.  He takes Plavix and a statin.   Pt states he has post nasal drip, and has been working with his PCP re this. He denies dyspnea, denies chest pain. His wife states that pt has a cough at night.   Diabetic: No Tobacco use: former smoker, quit in 1987    Past Medical History:  Diagnosis Date  . Abdominal aortic aneurysm ([place])    being monitored-"checked in Oct. and it stayed the same" - 4.22 x 4.22 cm.  (also on Echo - TAA - 3.7 cm)  . Arthritis   . Atrial fibrillation ([place]) 02/13/2013   Peri-infarction  . CAD S/P percutaneous coronary angioplasty 02/13/2013   PROXIMAL-MID LAD 95-99% AND 80% TANDEM LESIONS - PROMUS PREMIER DES 2.75 MM X 24 MM (2.9 MM)  . LV dysfunction, EF by echo 02/11/13 45-50% - 50-55% as of 06/2014 02/13/2013   Echo April 2016: EF 50-55%.  No RWMA.  Gr 1 DD.  ?Thoracid Aortic dilation ~ 3.7.   . Macular degeneration of left eye   . Prostate disease   . 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 ST elevation myocardial infarction (STEMI) of anterior wall, initial episode of care ([place]) 02/10/2013  . Tremors of nervous system    both hands-under control   Past Surgical History:  Procedure Laterality Date  . APPENDECTOMY  age 84   . CYSTOSCOPY W/ RETROGRADES Bilateral 02/03/2014   Procedure:  BILATERAL RETROGRADE PYELOGRAM;  Surgeon: Festus Aloe, MD;  Location: [facility];  Service: Urology;  Laterality: Bilateral;  . GREEN LIGHT LASER TURP (TRANSURETHRAL RESECTION OF PROSTATE N/A  02/03/2014   Procedure: GREEN LIGHT LASER TURP (TRANSURETHRAL RESECTION OF PROSTATE PHOTO VAPORIZATION;  Surgeon: Festus Aloe, MD;  Location: [facility];  Service: Urology;  Laterality: N/A;  . HERNIA REPAIR Left 2000  . LEFT HEART CATHETERIZATION WITH CORONARY ANGIOGRAM N/A 02/10/2013   Procedure: LEFT HEART CATHETERIZATION WITH CORONARY ANGIOGRAM;  Surgeon: Leonie Man, MD;  Location: [facility] CATH LAB;  Service: Cardiovascular;  Laterality: N/A;  . PERCUTANEOUS CORONARY STENT INTERVENTION (PCI-S)  02/10/13   PROXIMAL-MID LAD 95-99% AND 80% TANDEM LESIONS - PROMUS PREMIER DES 2.75 MM X 24 MM (2.9 MM)  . TRANSTHORACIC ECHOCARDIOGRAM  06/2014   EF 50-55%.  No RWMA.  Gr 1 DD.  ?Thoracid Aortic dilation ~ 3.7.    Social History Social History   Socioeconomic History  . Marital status: Married    Spouse name: Not on file  . Number of children: Not on file  . Years of education: Not on file  . Highest education level: Not on file  Occupational History  . Not on file  Social Needs  . Financial resource strain: Not on file  . Food insecurity:    Worry: Not on file    Inability: Not on file  . Transportation needs:    Medical: Not on file    Non-medical: Not on file  Tobacco Use  . Smoking status: Former Smoker  Packs/day: 1.00    Years: 10.00    Pack years: 10.00    Types: Cigarettes    Last attempt to quit: 06/07/1983    Years since quitting: 34.6  . Smokeless tobacco: Never Used  Substance and Sexual Activity  . Alcohol use: Yes    Alcohol/week: 0.0 standard drinks    Comment: social  . Drug use: No  . Sexual activity: Not on file  Lifestyle  . Physical activity:    Days per week: Not on file    Minutes per session: Not on file  . Stress: Not on file  Relationships  . Social connections:    Talks on phone: Not on file    Gets together: Not on file    Attends religious service: Not on file    Active member of club or organization: Not on file    Attends meetings of  clubs or organizations: Not on file    Relationship status: Not on file  . Intimate partner violence:    Fear of current or ex partner: Not on file    Emotionally abused: Not on file    Physically abused: Not on file    Forced sexual activity: Not on file  Other Topics Concern  . Not on file  Social History Narrative   He is married. Retired.   Quit smoking in 1985. Does not drink alcohol.   Currently ready to graduate from cardiac rehabilitation. Plans to sign up for followup program at the local [employer].   Family History No family history on file.  Current Outpatient Medications on File Prior to Visit  Medication Sig Dispense Refill  . alfuzosin (UROXATRAL) 10 MG 24 hr tablet Take 10 mg by mouth daily.  10  . atorvastatin (LIPITOR) 20 MG tablet TK 1 T PO QD  10  . clopidogrel (PLAVIX) 75 MG tablet Take 1 tablet (75 mg total) by mouth daily. 90 tablet 2  . Coenzyme Q10 (Q-10 CO-ENZYME PO) Take 100 mg by mouth every morning.     . dutasteride (AVODART) 0.5 MG capsule TK 1 C PO QD  3  . Glucosamine-Chondroit-Vit C-Mn (GLUCOSAMINE 1500 COMPLEX PO) Take 1,500 mg by mouth 2 (two) times daily.    . Loratadine (CLARITIN PO) Take 1 tablet by mouth daily.    Marland Kitchen loteprednol (ALREX) 0.2 % SUSP Place 1-2 drops into the left eye daily as needed (dryness in eye after injection.).    Marland Kitchen Multiple Vitamins-Minerals (ICAPS MV PO) Take 1 capsule by mouth 2 (two) times daily.    . nitroGLYCERIN (NITROSTAT) 0.4 MG SL tablet PLACE 1 TABLET UNDER THE TONGUE EVERY 5 MINUTES AS NEEDED FOR CHEST PAIN 75 tablet 1  . primidone (MYSOLINE) 50 MG tablet Take 100 mg by mouth 2 (two) times daily.     . sodium chloride (OCEAN) 0.65 % SOLN nasal spray Place 1 spray into both nostrils every 12 (twelve) hours as needed for congestion.     No current facility-administered medications on file prior to visit.    Allergies  Allergen Reactions  . Ciprofloxacin Diarrhea and Nausea Only    ROS: See HPI for pertinent positives  and negatives.  Physical Examination  Vitals:   01/18/18 0931  BP: 125/69  Pulse: 66  Resp: 17  Temp: (!) 97 F (36.1 C)  TempSrc: Oral  SpO2: 97%  Weight: 179 lb (81.2 kg)  Height: 5\' 10"  (1.778 m)   Body mass index is 25.68 kg/m.  General: A&O x 3,  WD, male. HEENT: Grossly intact and WNL.  Pulmonary: Sym exp, respirations are non labored, good air movement in all fields except diminished air movement in left posterior fields with few rales in left base, no rhonchi or wheezes. Cardiac: Regular rhythm and rate, no detected murmur.  Carotid Bruits Right Left   Negative Negative   Adominal aortic pulse is not palpable Radial pulses are 2+ palpable                          VASCULAR EXAM:                                                                                                         LE Pulses Right Left       FEMORAL  2+ palpable  2+ palpable        POPLITEAL  2+ palpable   2+ palpable       POSTERIOR TIBIAL  2+ palpable   1+ palpable        DORSALIS PEDIS      ANTERIOR TIBIAL not palpable  not palpable     Gastrointestinal: soft, NTND, -G/R, - HSM, - masses palpated, - CVAT B. RLQ old surgical scar.  Musculoskeletal: M/S 5/5 throughout, Extremities without ischemic changes. Skin: No rashes, no ulcers, no cellulitis.   Neurologic: CN 2-12 intact, Pain and light touch intact in extremities are intact, Motor exam as listed above. Psychiatric: Normal thought content, mood appropriate to clinical situation.    DATA  AAA Duplex (01/18/2018):  Previous size: 4.42 cm (Date: 12/08/16); Right CIA: 1.41 cm; Left CIA: 1.08 cm   Current size:  4.62 cm (Date: 01-18-18); Right CIA: 1.2 cm; Left CIA: 1.0 cm  Medical Decision Making  The patient is a 84 y.o. male who presents with asymptomatic AAA with 2 mm increase in size in thirteen months, at 4.62 cm today.   Bilateral prominent popliteal pulses: bilateral popliteal artery duplex (01-18-18) shows no popliteal  artery aneurysms.    Based on this patient's exam and diagnostic studies, the patient will follow up in 6 months with the following studies: AAA duplex..  Consideration for repair of AAA would be made when the size is 5.0 cm, growth > 1 cm/yr, and symptomatic status.        The patient was given information about AAA including signs, symptoms, treatment, and how to minimize the risk of enlargement and rupture of aneurysms.    I emphasized the importance of maximal medical management including strict control of blood pressure, blood glucose, and lipid levels, antiplatelet agents, obtaining regular exercise, and continued cessation of smoking.   The patient was advised to call 911 should the patient experience sudden onset abdominal or back pain.   Thank you for allowing [place] to participate in this patient's care.  Clemon Chambers, RN, MSN, FNP-C [facility] [place] Office: [phone]  Clinic Physician: Trula Slade  01/18/2018, 10:09 AM

## 2018-01-18 NOTE — Patient Instructions (Signed)
Before your next abdominal ultrasound:  Avoid gas forming foods and beverages the day before the test.   Take two Extra-Strength Gas-X capsules at bedtime the night before the test. Take another two Extra-Strength Gas-X capsules in the middle of the night if you get up to the restroom, if not, first thing in the morning with water.  Do not chew gum.      Abdominal Aortic Aneurysm Blood pumps away from the heart through tubes (blood vessels) called arteries. Aneurysms are weak or damaged places in the wall of an artery. It bulges out like a balloon. An abdominal aortic aneurysm happens in the main artery of the body (aorta). It can burst or tear, causing bleeding inside the body. This is an emergency. It needs treatment right away. What are the causes? The exact cause is unknown. Things that could cause this problem include:  Fat and other substances building up in the lining of a tube.  Swelling of the walls of a blood vessel.  Certain tissue diseases.  Belly (abdominal) trauma.  An infection in the main artery of the body.  What increases the risk? There are things that make it more likely for you to have an aneurysm. These include:  Being over the age of 82 years old.  Having high blood pressure (hypertension).  Being a male.  Being white.  Being very overweight (obese).  Having a family history of aneurysm.  Using tobacco products.  What are the signs or symptoms? Symptoms depend on the size of the aneurysm and how fast it grows. There may not be symptoms. If symptoms occur, they can include:  Pain (belly, side, lower back, or groin).  Feeling full after eating a small amount of food.  Feeling sick to your stomach (nauseous), throwing up (vomiting), or both.  Feeling a lump in your belly that feels like it is beating (pulsating).  Feeling like you will pass out (faint).  How is this treated?  Medicine to control blood pressure and pain.  Imaging tests to  see if the aneurysm gets bigger.  Surgery. How is this prevented? To lessen your chance of getting this condition:  Stop smoking. Stop chewing tobacco.  Limit or avoid alcohol.  Keep your blood pressure, blood sugar, and cholesterol within normal limits.  Eat less salt.  Eat foods low in saturated fats and cholesterol. These are found in animal and whole dairy products.  Eat more fiber. Fiber is found in whole grains, vegetables, and fruits.  Keep a healthy weight.  Stay active and exercise often.  This information is not intended to replace advice given to you by your health care provider. Make sure you discuss any questions you have with your health care provider. Document Released: 07/05/2012 Document Revised: 08/16/2015 Document Reviewed: 04/09/2012 Elsevier Interactive Patient Education  2017 [employer]  

## 2018-01-27 ENCOUNTER — Encounter: Payer: Self-pay | Admitting: Podiatry

## 2018-01-27 ENCOUNTER — Ambulatory Visit: Payer: PPO | Admitting: Podiatry

## 2018-01-27 DIAGNOSIS — Q828 Other specified congenital malformations of skin: Secondary | ICD-10-CM | POA: Diagnosis not present

## 2018-01-27 DIAGNOSIS — M79676 Pain in unspecified toe(s): Secondary | ICD-10-CM | POA: Diagnosis not present

## 2018-01-27 DIAGNOSIS — B351 Tinea unguium: Secondary | ICD-10-CM | POA: Diagnosis not present

## 2018-01-27 DIAGNOSIS — D689 Coagulation defect, unspecified: Secondary | ICD-10-CM

## 2018-01-27 NOTE — Progress Notes (Signed)
Complaint:  Visit Type: Patient returns to my office for continued preventative foot care services. Complaint: Patient states" my nails have grown long and thick and become painful to walk and wear shoes"  The patient presents for preventative foot care services. No changes to ROS.  Painful callus on outside of right foot.  Podiatric Exam: Vascular: dorsalis pedis and posterior tibial pulses are palpable bilateral. Capillary return is immediate. Temperature gradient is WNL. Skin turgor WNL  Sensorium: Normal Semmes Weinstein monofilament test. Normal tactile sensation bilaterally. Nail Exam: Pt has thick disfigured discolored nails with subungual debris noted bilateral entire nail hallux through fifth toenails Ulcer Exam: There is no evidence of ulcer or pre-ulcerative changes or infection. Orthopedic Exam: Muscle tone and strength are WNL. No limitations in general ROM. No crepitus or effusions noted. Foot type and digits show no abnormalities. Bony prominences are unremarkable. Skin: No Porokeratosis. No infection or ulcers  Diagnosis:  Onychomycosis, , Pain in right toe, pain in left toes  Treatment & Plan Procedures and Treatment: Consent by patient was obtained for treatment procedures.   Debridement of mycotic and hypertrophic toenails, 1 through 5 bilateral and clearing of subungual debris. No ulceration, no infection noted.  Return Visit-Office Procedure: Patient instructed to return to the office for a follow up visit 3 months for continued evaluation and treatment.    Tylar Merendino DPM 

## 2018-02-17 DIAGNOSIS — M189 Osteoarthritis of first carpometacarpal joint, unspecified: Secondary | ICD-10-CM | POA: Diagnosis not present

## 2018-02-17 DIAGNOSIS — I25119 Atherosclerotic heart disease of native coronary artery with unspecified angina pectoris: Secondary | ICD-10-CM | POA: Diagnosis not present

## 2018-02-17 DIAGNOSIS — I10 Essential (primary) hypertension: Secondary | ICD-10-CM | POA: Diagnosis not present

## 2018-02-17 DIAGNOSIS — N400 Enlarged prostate without lower urinary tract symptoms: Secondary | ICD-10-CM | POA: Diagnosis not present

## 2018-03-03 DIAGNOSIS — R339 Retention of urine, unspecified: Secondary | ICD-10-CM | POA: Diagnosis not present

## 2018-03-03 DIAGNOSIS — R3914 Feeling of incomplete bladder emptying: Secondary | ICD-10-CM | POA: Diagnosis not present

## 2018-03-10 DIAGNOSIS — H353221 Exudative age-related macular degeneration, left eye, with active choroidal neovascularization: Secondary | ICD-10-CM | POA: Diagnosis not present

## 2018-04-05 DIAGNOSIS — R3914 Feeling of incomplete bladder emptying: Secondary | ICD-10-CM | POA: Diagnosis not present

## 2018-04-05 DIAGNOSIS — R339 Retention of urine, unspecified: Secondary | ICD-10-CM | POA: Diagnosis not present

## 2018-04-07 DIAGNOSIS — I10 Essential (primary) hypertension: Secondary | ICD-10-CM | POA: Diagnosis not present

## 2018-04-07 DIAGNOSIS — N400 Enlarged prostate without lower urinary tract symptoms: Secondary | ICD-10-CM | POA: Diagnosis not present

## 2018-04-07 DIAGNOSIS — I25119 Atherosclerotic heart disease of native coronary artery with unspecified angina pectoris: Secondary | ICD-10-CM | POA: Diagnosis not present

## 2018-04-07 DIAGNOSIS — M189 Osteoarthritis of first carpometacarpal joint, unspecified: Secondary | ICD-10-CM | POA: Diagnosis not present

## 2018-05-06 DIAGNOSIS — M189 Osteoarthritis of first carpometacarpal joint, unspecified: Secondary | ICD-10-CM | POA: Diagnosis not present

## 2018-05-06 DIAGNOSIS — N400 Enlarged prostate without lower urinary tract symptoms: Secondary | ICD-10-CM | POA: Diagnosis not present

## 2018-05-06 DIAGNOSIS — I25119 Atherosclerotic heart disease of native coronary artery with unspecified angina pectoris: Secondary | ICD-10-CM | POA: Diagnosis not present

## 2018-05-06 DIAGNOSIS — I10 Essential (primary) hypertension: Secondary | ICD-10-CM | POA: Diagnosis not present

## 2018-05-10 DIAGNOSIS — H35363 Drusen (degenerative) of macula, bilateral: Secondary | ICD-10-CM | POA: Diagnosis not present

## 2018-05-10 DIAGNOSIS — H31092 Other chorioretinal scars, left eye: Secondary | ICD-10-CM | POA: Diagnosis not present

## 2018-05-10 DIAGNOSIS — H353112 Nonexudative age-related macular degeneration, right eye, intermediate dry stage: Secondary | ICD-10-CM | POA: Diagnosis not present

## 2018-05-10 DIAGNOSIS — Z961 Presence of intraocular lens: Secondary | ICD-10-CM | POA: Diagnosis not present

## 2018-05-10 DIAGNOSIS — H35453 Secondary pigmentary degeneration, bilateral: Secondary | ICD-10-CM | POA: Diagnosis not present

## 2018-05-10 DIAGNOSIS — H353223 Exudative age-related macular degeneration, left eye, with inactive scar: Secondary | ICD-10-CM | POA: Diagnosis not present

## 2018-05-12 DIAGNOSIS — R3914 Feeling of incomplete bladder emptying: Secondary | ICD-10-CM | POA: Diagnosis not present

## 2018-05-12 DIAGNOSIS — R339 Retention of urine, unspecified: Secondary | ICD-10-CM | POA: Diagnosis not present

## 2018-05-26 ENCOUNTER — Ambulatory Visit: Payer: PPO | Admitting: Podiatry

## 2018-05-26 ENCOUNTER — Encounter: Payer: Self-pay | Admitting: Podiatry

## 2018-05-26 DIAGNOSIS — M79676 Pain in unspecified toe(s): Secondary | ICD-10-CM | POA: Diagnosis not present

## 2018-05-26 DIAGNOSIS — B351 Tinea unguium: Secondary | ICD-10-CM | POA: Diagnosis not present

## 2018-05-26 DIAGNOSIS — D689 Coagulation defect, unspecified: Secondary | ICD-10-CM | POA: Diagnosis not present

## 2018-05-26 NOTE — Progress Notes (Signed)
Complaint:  Visit Type: Patient returns to my office for continued preventative foot care services. Complaint: Patient states" my nails have grown long and thick and become painful to walk and wear shoes"  The patient presents for preventative foot care services. No changes to ROS.  Painful callus on outside of right foot.  Podiatric Exam: Vascular: dorsalis pedis and posterior tibial pulses are palpable bilateral. Capillary return is immediate. Temperature gradient is WNL. Skin turgor WNL  Sensorium: Normal Semmes Weinstein monofilament test. Normal tactile sensation bilaterally. Nail Exam: Pt has thick disfigured discolored nails with subungual debris noted bilateral entire nail hallux through fifth toenails Ulcer Exam: There is no evidence of ulcer or pre-ulcerative changes or infection. Orthopedic Exam: Muscle tone and strength are WNL. No limitations in general ROM. No crepitus or effusions noted. Foot type and digits show no abnormalities. Bony prominences are unremarkable. Skin: No Porokeratosis. No infection or ulcers  Diagnosis:  Onychomycosis, , Pain in right toe, pain in left toes  Treatment & Plan Procedures and Treatment: Consent by patient was obtained for treatment procedures.   Debridement of mycotic and hypertrophic toenails, 1 through 5 bilateral and clearing of subungual debris. No ulceration, no infection noted.  Return Visit-Office Procedure: Patient instructed to return to the office for a follow up visit 3 months for continued evaluation and treatment.    Gardiner Barefoot DPM

## 2018-06-03 DIAGNOSIS — N400 Enlarged prostate without lower urinary tract symptoms: Secondary | ICD-10-CM | POA: Diagnosis not present

## 2018-06-03 DIAGNOSIS — I25119 Atherosclerotic heart disease of native coronary artery with unspecified angina pectoris: Secondary | ICD-10-CM | POA: Diagnosis not present

## 2018-06-03 DIAGNOSIS — M189 Osteoarthritis of first carpometacarpal joint, unspecified: Secondary | ICD-10-CM | POA: Diagnosis not present

## 2018-06-03 DIAGNOSIS — I10 Essential (primary) hypertension: Secondary | ICD-10-CM | POA: Diagnosis not present

## 2018-06-10 DIAGNOSIS — R3914 Feeling of incomplete bladder emptying: Secondary | ICD-10-CM | POA: Diagnosis not present

## 2018-06-10 DIAGNOSIS — R339 Retention of urine, unspecified: Secondary | ICD-10-CM | POA: Diagnosis not present

## 2018-07-07 DIAGNOSIS — N400 Enlarged prostate without lower urinary tract symptoms: Secondary | ICD-10-CM | POA: Diagnosis not present

## 2018-07-07 DIAGNOSIS — E7800 Pure hypercholesterolemia, unspecified: Secondary | ICD-10-CM | POA: Diagnosis not present

## 2018-07-07 DIAGNOSIS — Z0000 Encounter for general adult medical examination without abnormal findings: Secondary | ICD-10-CM | POA: Diagnosis not present

## 2018-07-07 DIAGNOSIS — H9201 Otalgia, right ear: Secondary | ICD-10-CM | POA: Diagnosis not present

## 2018-07-07 DIAGNOSIS — I714 Abdominal aortic aneurysm, without rupture: Secondary | ICD-10-CM | POA: Diagnosis not present

## 2018-07-07 DIAGNOSIS — M79641 Pain in right hand: Secondary | ICD-10-CM | POA: Diagnosis not present

## 2018-07-07 DIAGNOSIS — Z1389 Encounter for screening for other disorder: Secondary | ICD-10-CM | POA: Diagnosis not present

## 2018-07-07 DIAGNOSIS — I10 Essential (primary) hypertension: Secondary | ICD-10-CM | POA: Diagnosis not present

## 2018-07-08 ENCOUNTER — Other Ambulatory Visit: Payer: Self-pay

## 2018-07-08 DIAGNOSIS — I714 Abdominal aortic aneurysm, without rupture, unspecified: Secondary | ICD-10-CM

## 2018-07-21 DIAGNOSIS — R339 Retention of urine, unspecified: Secondary | ICD-10-CM | POA: Diagnosis not present

## 2018-07-21 DIAGNOSIS — R3914 Feeling of incomplete bladder emptying: Secondary | ICD-10-CM | POA: Diagnosis not present

## 2018-07-23 ENCOUNTER — Ambulatory Visit: Payer: PPO | Admitting: Family

## 2018-07-23 ENCOUNTER — Other Ambulatory Visit (HOSPITAL_COMMUNITY): Payer: PPO

## 2018-07-28 DIAGNOSIS — H353223 Exudative age-related macular degeneration, left eye, with inactive scar: Secondary | ICD-10-CM | POA: Diagnosis not present

## 2018-08-05 DIAGNOSIS — I10 Essential (primary) hypertension: Secondary | ICD-10-CM | POA: Diagnosis not present

## 2018-08-05 DIAGNOSIS — N400 Enlarged prostate without lower urinary tract symptoms: Secondary | ICD-10-CM | POA: Diagnosis not present

## 2018-08-17 ENCOUNTER — Other Ambulatory Visit: Payer: Self-pay

## 2018-08-17 ENCOUNTER — Telehealth: Payer: Self-pay | Admitting: *Deleted

## 2018-08-17 NOTE — Telephone Encounter (Signed)
   TELEPHONE CALL NOTE  This patient has been deemed a candidate for follow-up tele-health visit to limit community exposure during the Covid-19 pandemic. I spoke with the patient via phone to discuss instructions.   The patient was advised to review the section on consent for treatment as well. The patient will receive a phone call 2-3 days prior to their E-Visit at which time consent will be verbally confirmed.   A Virtual Office Visit appointment type has been scheduled for 08/19/18 with [facility], with "VIDEO/EMAIL.  I have  confirmed the patient is active in [place].    Raiford Simmonds, RN 08/17/2018 9:55 AM

## 2018-08-17 NOTE — Patient Outreach (Signed)
[facility]) Care Management  08/17/2018  [name] [birth year] [id number]   Referral Date: 08/17/2018 Referral Source: Nurseline Referral Reason: Fever 99.4   Outreach Attempt: Spoke with patient.  He is able to verify HIPAA. Discussed reason for call.  Patient states that he was checking because he had a low grade fever and was having company over and just wanted to make sure.  He states that he self cath's himself and thinks that was the source of his low grade fever.  He states that he also had some tingling as well.  However, states that has all subsided and is doing well and having company today.  He states he did not need to see his physician but states he has an already scheduled appointment on tomorrow.  He denies any needs or support at this time.    Plan: RN CM will close case.     Jone Baseman, RN, MSN [facility] Care Management Care Management Coordinator Direct Line [phone] Toll Free: [phone]  Fax: [phone]

## 2018-08-18 ENCOUNTER — Telehealth: Payer: Self-pay | Admitting: Cardiology

## 2018-08-18 DIAGNOSIS — D1801 Hemangioma of skin and subcutaneous tissue: Secondary | ICD-10-CM | POA: Diagnosis not present

## 2018-08-18 DIAGNOSIS — L821 Other seborrheic keratosis: Secondary | ICD-10-CM | POA: Diagnosis not present

## 2018-08-18 DIAGNOSIS — D225 Melanocytic nevi of trunk: Secondary | ICD-10-CM | POA: Diagnosis not present

## 2018-08-18 DIAGNOSIS — Z85828 Personal history of other malignant neoplasm of skin: Secondary | ICD-10-CM | POA: Diagnosis not present

## 2018-08-18 DIAGNOSIS — L570 Actinic keratosis: Secondary | ICD-10-CM | POA: Diagnosis not present

## 2018-08-18 NOTE — Telephone Encounter (Signed)
INFORMED PATIENT . PRE -REG HAD CALLED HIM SHE WILL CALL HIM BACK TODAY OR IN THE MORNING - MESSAGE WAS SENT  PATIENT VERBALIZED UNDERSTANDING.

## 2018-08-18 NOTE — Telephone Encounter (Signed)
New Message ° ° ° °Pt is returning call  ° ° ° °Please call back  °

## 2018-08-19 ENCOUNTER — Telehealth (INDEPENDENT_AMBULATORY_CARE_PROVIDER_SITE_OTHER): Payer: PPO | Admitting: Cardiology

## 2018-08-19 VITALS — BP 123/65 | HR 70 | Temp 97.4°F | Ht 70.0 in | Wt 176.0 lb

## 2018-08-19 DIAGNOSIS — E785 Hyperlipidemia, unspecified: Secondary | ICD-10-CM

## 2018-08-19 DIAGNOSIS — I519 Heart disease, unspecified: Secondary | ICD-10-CM

## 2018-08-19 DIAGNOSIS — I2510 Atherosclerotic heart disease of native coronary artery without angina pectoris: Secondary | ICD-10-CM

## 2018-08-19 DIAGNOSIS — Z8679 Personal history of other diseases of the circulatory system: Secondary | ICD-10-CM

## 2018-08-19 DIAGNOSIS — R030 Elevated blood-pressure reading, without diagnosis of hypertension: Secondary | ICD-10-CM

## 2018-08-19 DIAGNOSIS — I2109 ST elevation (STEMI) myocardial infarction involving other coronary artery of anterior wall: Secondary | ICD-10-CM

## 2018-08-19 DIAGNOSIS — Z9861 Coronary angioplasty status: Secondary | ICD-10-CM

## 2018-08-19 NOTE — Patient Instructions (Addendum)
Medication Instructions:    Change dose interval for Plavix (clopidogrel) - take on Mon, Wed, Fri, Sun only  If you need a refill on your cardiac medications before your next appointment, please call your pharmacy.   Lab work:  Pls ask Dr. Carlyle Lipa RN to send copy of upcoming labs.   Testing/Procedures: Not needed  Follow-Up: At [facility], you and your health needs are our priority.  As part of our continuing mission to provide you with exceptional heart care, we have created designated Provider Care Teams.  These Care Teams include your primary Cardiologist (physician) and Advanced Practice Providers (APPs -  Physician Assistants and Nurse Practitioners) who all work together to provide you with the care you need, when you need it. . You will need a follow up appointment in 8 months- Jan 2021.  Please call our office 2 months in advance to schedule this appointment.  You may see Glenetta Hew, MD or one of the following Advanced Practice Providers on your designated Care Team:   . Rosaria Ferries, PA-C . Jory Sims, DNP, ANP  Any Other Special Instructions Will Be Listed Below (If Applicable).

## 2018-08-19 NOTE — Progress Notes (Signed)
Virtual Visit via Video Note   This visit type was conducted due to national recommendations for restrictions regarding the COVID-19 Pandemic (e.g. social distancing) in an effort to limit this patient's exposure and mitigate transmission in our community.  Due to his co-morbid illnesses, this patient is at least at moderate risk for complications without adequate follow up.  This format is felt to be most appropriate for this patient at this time.  All issues noted in this document were discussed and addressed.  A limited physical exam was performed with this format.  Please refer to the patient's chart for his consent to telehealth for [facility].   Patient has given verbal permission to conduct this visit via virtual appointment and to bill insurance 08/17/2018 10:00 AM     Evaluation Performed:  Follow-up visit  Date:  08/22/2018   ID:  [name], DOB Jan 03, 1932, MRN [id number]  Patient Location: Home Provider Location: Office  PCP:  Lajean Manes, MD  Cardiologist:  Glenetta Hew, MD  Electrophysiologist:  None   Chief Complaint: Annual follow-up: CAD-PCI  History of Present Illness:    [name] is a 85 y.o. male with PMH notable for anterior STEMI 01/2013-PCI LAD who presents via audio/video conferencing for a telehealth visit today.   History of anterior STEMI in November 2014 - with VT: PROXIMAL-MID LAD 95-99% AND 80% TANDEM LESIONS -- PCI w/ PROMUS PREMIER DES 2.75 MM X 24 MM (2.9 MM)   STEMI complicated by Afib - no further recurrence   Initial mild ICM - resolved with EF 50-55% (06/2014)  He had ventricular tachycardia as well as atrial fibrillation peri-infarct, but no residual episodes.  AAA - followed by [name]  Has been intolerant of beta-blocker because of fatigue and hypotension, also no ARB because of hypotension.   [name] was last seen in May 2019.  Was doing well.  Remaining active and exercising several days a week and doing yard  work.  Was dealing with a cold at the time of his visit.  Interval History:  [name] is doing very well.  He really is still as active as he is always been.  He is out doing yard work, walking and been able to use using stationary bicycle and rode bicycle.  He has been out of the gym exercises that he used to do because of COVID-19 restrictions, but is otherwise picking up more non-gym related activities.  He continues to be very stable from a cardiac standpoint with no active symptoms.  Has not had any angina or heart failure symptoms.  No signs or symptoms of recurrent A. Fib.  His AAA and iliac arteries are being followed by vascular surgery.  There has been some increased size in the abdominal aorta, however still not to the point where invasive management is required.  Cardiovascular ROS: negative negative for - edema, irregular heartbeat, orthopnea, palpitations, paroxysmal nocturnal dyspnea, rapid heart rate, shortness of breath or syncope, near syncope; Cameron/ amaurosis fugax , claudication  The patient does not have symptoms concerning for COVID-19 infection (fever, chills, cough, or new shortness of breath).  The patient is practicing social distancing.  Neighbors help out.   ROS:  Please see the history of present illness.    Review of Systems  Constitutional: Negative for malaise/fatigue and weight loss.  HENT: Positive for congestion and sinus pain. Negative for nosebleeds.        Allergy related congestion  Respiratory: Negative for cough and  shortness of breath.   Gastrointestinal: Positive for heartburn. Negative for abdominal pain, blood in stool, melena, nausea and vomiting.  Genitourinary: Negative for hematuria.  Musculoskeletal: Positive for myalgias (if over does it). Negative for falls and joint pain.  Neurological: Negative for dizziness, focal weakness and headaches.  Endo/Heme/Allergies: Positive for environmental allergies (takes daily allergy pill & Flonase).  Bruises/bleeds easily.  Psychiatric/Behavioral: Negative.   All other systems reviewed and are negative.  - Still does I-O cath 1 x day  Past Medical History:  Diagnosis Date  . Abdominal aortic aneurysm ([place])    being monitored-"checked in Oct. and it stayed the same" - 4.22 x 4.22 cm.  (also on Echo - TAA - 3.7 cm)  . Arthritis   . Atrial fibrillation ([place]) 02/13/2013   Peri-infarction  . CAD S/P percutaneous coronary angioplasty 02/13/2013   PROXIMAL-MID LAD 95-99% AND 80% TANDEM LESIONS - PROMUS PREMIER DES 2.75 MM X 24 MM (2.9 MM)  . LV dysfunction, EF by echo 02/11/13 45-50% - 50-55% as of 06/2014 02/13/2013   Echo April 2016: EF 50-55%.  No RWMA.  Gr 1 DD.  ?Thoracid Aortic dilation ~ 3.7.   . Macular degeneration of left eye   . Prostate disease   . 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 ST elevation myocardial infarction (STEMI) of anterior wall, initial episode of care ([place]) 02/10/2013  . Tremors of nervous system    both hands-under control   Past Surgical History:  Procedure Laterality Date  . APPENDECTOMY  age 23   . CYSTOSCOPY W/ RETROGRADES Bilateral 02/03/2014   Procedure:  BILATERAL RETROGRADE PYELOGRAM;  Surgeon: Festus Aloe, MD;  Location: [facility]S;  Service: Urology;  Laterality: Bilateral;  . GREEN LIGHT LASER TURP (TRANSURETHRAL RESECTION OF PROSTATE N/A 02/03/2014   Procedure: GREEN LIGHT LASER TURP (TRANSURETHRAL RESECTION OF PROSTATE PHOTO VAPORIZATION;  Surgeon: Festus Aloe, MD;  Location: [facility];  Service: Urology;  Laterality: N/A;  . HERNIA REPAIR Left 2000  . LEFT HEART CATHETERIZATION WITH CORONARY ANGIOGRAM N/A 02/10/2013   Procedure: LEFT HEART CATHETERIZATION WITH CORONARY ANGIOGRAM;  Surgeon: Leonie Man, MD;  Location[facility] LAB;  Service: Cardiovascular;  Laterality: N/A;  . PERCUTANEOUS CORONARY STENT INTERVENTION (PCI-S)  02/10/13   PROXIMAL-MID LAD 95-99% AND 80% TANDEM LESIONS - PROMUS PREMIER DES 2.75 MM X 24 MM (2.9 MM)  .  TRANSTHORACIC ECHOCARDIOGRAM  06/2014   EF 50-55%.  No RWMA.  Gr 1 DD.  ?Thoracid Aortic dilation ~ 3.7.      Current Meds  Medication Sig  . alfuzosin (UROXATRAL) 10 MG 24 hr tablet Take 10 mg by mouth daily.  Marland Kitchen atorvastatin (LIPITOR) 20 MG tablet TK 1 T PO QD  . clopidogrel (PLAVIX) 75 MG tablet Take 1 tablet (75 mg total) by mouth daily.  . Coenzyme Q10 (Q-10 CO-ENZYME PO) Take 100 mg by mouth every morning.   . dutasteride (AVODART) 0.5 MG capsule TK 1 C PO QD  . fluticasone (FLONASE) 50 MCG/ACT nasal spray SPRAY 2 SPRAYS INTO EACH NOSTRIL DAILY AT NIGHT  . Glucosamine-Chondroit-Vit C-Mn (GLUCOSAMINE 1500 COMPLEX PO) Take 1,500 mg by mouth 2 (two) times a day.  . levocetirizine (XYZAL) 5 MG tablet TAKE 1 TABLET BY MOUTH ONCE DAILY FOR ALLERGIES.  Marland Kitchen Loratadine (CLARITIN PO) Take 1 tablet by mouth daily.  Marland Kitchen loteprednol (ALREX) 0.2 % SUSP Place 1-2 drops into the left eye daily as needed (dryness in eye after injection.).  Marland Kitchen  Multiple Vitamins-Minerals (ICAPS AREDS 2 PO) Take 1 mg by mouth daily.  . nitroGLYCERIN (NITROSTAT) 0.4 MG SL tablet PLACE 1 TABLET UNDER THE TONGUE EVERY 5 MINUTES AS NEEDED FOR CHEST PAIN  . primidone (MYSOLINE) 50 MG tablet Take 50 mg by mouth 2 (two) times daily.   . sodium chloride (OCEAN) 0.65 % SOLN nasal spray Place 1 spray into both nostrils every 12 (twelve) hours as needed for congestion.  . [DISCONTINUED] Glucosamine-Chondroit-Vit C-Mn (GLUCOSAMINE 1500 COMPLEX PO) Take 1,500 mg by mouth 2 (two) times daily.  . [DISCONTINUED] Multiple Vitamins-Minerals (ICAPS MV PO) Take 1 capsule by mouth 2 (two) times daily.     Allergies:   Ciprofloxacin   Social History   Tobacco Use  . Smoking status: Former Smoker    Packs/day: 1.00    Years: 10.00    Pack years: 10.00    Types: Cigarettes    Last attempt to quit: 06/07/1983    Years since quitting: 35.2  . Smokeless tobacco: Never Used  Substance Use Topics  . Alcohol use: Yes    Alcohol/week: 0.0  standard drinks    Comment: social  . Drug use: No     Family Hx: The patient's family history is not on file.   Prior CV studies:   The following studies were reviewed today: From October 2019 . Abdominal iliac Dopplers: Distal abdominal aortic dilation roughly 4.6 cm largest.  Increased from 4.4 cm in September 2018).--Currently planning medical management . Lower extremity arterial Dopplers: No evidence of popliteal artery aneurysm on either leg.  Labs/Other Tests and Data Reviewed:    EKG:  No ECG reviewed.  Recent Labs: No results found for requested labs within last 8760 hours.   Recent Lipid Panel - PCP checks - last time~6 months ago  Wt Readings from Last 3 Encounters:  08/19/18 176 lb (79.8 kg)  01/18/18 179 lb (81.2 kg)  08/19/17 178 lb 12.8 oz (81.1 kg)     Objective:    Vital Signs:  BP 123/65   Pulse 70   Temp (!) 97.4 F (36.3 C)   Ht 5\' 10"  (1.778 m)   Wt 176 lb (79.8 kg)   BMI 25.25 kg/m   VITAL SIGNS:  reviewed GEN:  Well nourished, well developed male in no acute distress.  Appears younger than stated age RESPIRATORY:  normal respiratory effort, symmetric expansion CARDIOVASCULAR:  no peripheral edema, no JVD MUSCULOSKELETAL:  no obvious deformities. NEURO:  alert and oriented x 3, no obvious focal deficit PSYCH:  normal affect   ASSESSMENT & PLAN:    Problem List Items Addressed This Visit    Borderline hypertension - Primary (Chronic)    In the past, he has not tolerated either beta-blocker or ARB.  Beta-blockers cause fatigue and hypotension and ARB simply dizziness and hypotension.  Since his blood pressure is pretty well controlled currently on no medicines prefer to avoid adding medication for this 85 year old who is otherwise doing well.      CAD S/P Prox-Mid LAD; Promus DES 02/10/13 (Chronic)    Large LAD stent.  Is now over 5-1/2 years out from his PCI.  Has been on Plavix alone without aspirin.  States he is having a look like  bruising.  We will simply make Plavix every other day taking it on Monday Wednesday Friday and Sunday.  He is on stable dose of statin  As he is asymptomatic and has been intolerant in the past of beta-blocker and or ARB because  of hypotension and fatigue, we have not kept him on either beta-blocker or ARB.       H/O atrial fibrillation without current medication - peri-infarct with no recurrence (Chronic)    No symptomatic signs of recurrence since MI.  This could very well be ischemic related.  Continue to follow.      Hyperlipidemia with target LDL less than 70 (Chronic)    Remains on moderate dose statin.  Lipids have looked well controlled in the past.  Pretty much at goal.  No change.      LV dysfunction, EF by echo 02/11/13 45-50% (Chronic)    EF improved post PCI.  Intolerant of standard medications.  With pretty significant improvement in EF and no active symptoms, but symptoms continue to follow.      STEMI of anterior wall - 02/10/13 (Chronic)    Significant anterior MI with initially reduced EF that is now back to roughly 50% by most recent echo.  Anterior wall seems to be moving relatively well.  No recurrent angina or heart failure symptoms.         COVID-19 Education: The signs and symptoms of COVID-19 were discussed with the patient and how to seek care for testing (follow up with PCP or arrange E-visit).   The importance of social distancing was discussed today.  Time:   Today, I have spent 18 minutes with the patient with telehealth technology discussing the above problems.  Additional 5 minutes spent with reviewing old chart and charting.   Medication Adjustments/Labs and Tests Ordered: Current medicines are reviewed at length with the patient today.  Concerns regarding medicines are outlined above.  Medication Instructions:  Change dose interval for Plavix (clopidogrel) - take on Mon, Wed, Fri, Sun only  Tests Ordered: No orders of the defined types were  placed in this encounter. Pls ask Dr. Carlyle Lipa RN to send copy of upcoming labs.   Medication Changes: No orders of the defined types were placed in this encounter.   Disposition:  Follow up in 8 month(s)    Signed, Glenetta Hew, MD  08/22/2018 3:41 PM    [place]

## 2018-08-19 NOTE — Assessment & Plan Note (Signed)
Significant anterior MI with initially reduced EF that is now back to roughly 50% by most recent echo.  Anterior wall seems to be moving relatively well.  No recurrent angina or heart failure symptoms.

## 2018-08-22 ENCOUNTER — Encounter: Payer: Self-pay | Admitting: Cardiology

## 2018-08-22 NOTE — Assessment & Plan Note (Signed)
Remains on moderate dose statin.  Lipids have looked well controlled in the past.  Pretty much at goal.  No change.

## 2018-08-22 NOTE — Assessment & Plan Note (Addendum)
In the past, he has not tolerated either beta-blocker or ARB.  Beta-blockers cause fatigue and hypotension and ARB simply dizziness and hypotension.  Since his blood pressure is pretty well controlled currently on no medicines prefer to avoid adding medication for this 85 year old who is otherwise doing well.

## 2018-08-22 NOTE — Assessment & Plan Note (Signed)
No symptomatic signs of recurrence since MI.  This could very well be ischemic related.  Continue to follow.

## 2018-08-22 NOTE — Assessment & Plan Note (Signed)
EF improved post PCI.  Intolerant of standard medications.  With pretty significant improvement in EF and no active symptoms, but symptoms continue to follow.

## 2018-08-22 NOTE — Assessment & Plan Note (Addendum)
Large LAD stent.  Is now over 5-1/2 years out from his PCI.  Has been on Plavix alone without aspirin.  States he is having a look like bruising.  We will simply make Plavix every other day taking it on Monday Wednesday Friday and Sunday.  He is on stable dose of statin  As he is asymptomatic and has been intolerant in the past of beta-blocker and or ARB because of hypotension and fatigue, we have not kept him on either beta-blocker or ARB.

## 2018-08-27 ENCOUNTER — Other Ambulatory Visit: Payer: Self-pay

## 2018-08-27 ENCOUNTER — Encounter: Payer: Self-pay | Admitting: Podiatry

## 2018-08-27 ENCOUNTER — Ambulatory Visit: Payer: PPO | Admitting: Podiatry

## 2018-08-27 DIAGNOSIS — D689 Coagulation defect, unspecified: Secondary | ICD-10-CM

## 2018-08-27 DIAGNOSIS — M79676 Pain in unspecified toe(s): Secondary | ICD-10-CM

## 2018-08-27 DIAGNOSIS — B351 Tinea unguium: Secondary | ICD-10-CM

## 2018-08-27 NOTE — Progress Notes (Signed)
Complaint:  Visit Type: Patient returns to my office for continued preventative foot care services. Complaint: Patient states" my nails have grown long and thick and become painful to walk and wear shoes"  The patient presents for preventative foot care services. No changes to ROS.  Patient is taking plavix.   Podiatric Exam: Vascular: dorsalis pedis and posterior tibial pulses are palpable bilateral. Capillary return is immediate. Temperature gradient is WNL. Skin turgor WNL  Sensorium: Normal Semmes Weinstein monofilament test. Normal tactile sensation bilaterally. Nail Exam: Pt has thick disfigured discolored nails with subungual debris noted bilateral entire nail hallux through fifth toenails Ulcer Exam: There is no evidence of ulcer or pre-ulcerative changes or infection. Orthopedic Exam: Muscle tone and strength are WNL. No limitations in general ROM. No crepitus or effusions noted. Foot type and digits show no abnormalities. Bony prominences are unremarkable. Skin: No Porokeratosis. No infection or ulcers  Diagnosis:  Onychomycosis, , Pain in right toe, pain in left toes  Treatment & Plan Procedures and Treatment: Consent by patient was obtained for treatment procedures.   Debridement of mycotic and hypertrophic toenails, 1 through 5 bilateral and clearing of subungual debris. No ulceration, no infection noted.  Return Visit-Office Procedure: Patient instructed to return to the office for a follow up visit 3 months for continued evaluation and treatment.    Gardiner Barefoot DPM

## 2018-09-02 DIAGNOSIS — N400 Enlarged prostate without lower urinary tract symptoms: Secondary | ICD-10-CM | POA: Diagnosis not present

## 2018-09-02 DIAGNOSIS — I10 Essential (primary) hypertension: Secondary | ICD-10-CM | POA: Diagnosis not present

## 2018-09-08 NOTE — Telephone Encounter (Signed)
The above patient or their representative was contacted and gave the following answers to these questions:         Do you have any of the following symptoms?n  Fever                    Cough                   Shortness of breath  Do  you have any of the following other symptoms? n   muscle pain         vomiting,        diarrhea        rash         weakness        red eye        abdominal pain         bruising          bruising or bleeding              joint pain           severe headache    Have you been in contact with someone who was or has been sick in the past 2 weeks?n  Yes                 Unsure                         Unable to assess   Does the person that you were in contact with have any of the following symptoms? n  Cough         shortness of breath           muscle pain         vomiting,            diarrhea            rash            weakness           fever            red eye           abdominal pain           bruising  or  bleeding                joint pain                severe headache               Have you  or someone you have been in contact with traveled internationally in th last month?   n      If yes, which countries?   Have you  or someone you have been in contact with traveled outside [place] in th last month?      n   If yes, which state and city?   COMMENTS OR ACTION PLAN FOR THIS PATIENT:          

## 2018-09-10 ENCOUNTER — Encounter: Payer: Self-pay | Admitting: Family

## 2018-09-10 ENCOUNTER — Other Ambulatory Visit: Payer: Self-pay

## 2018-09-10 ENCOUNTER — Ambulatory Visit (INDEPENDENT_AMBULATORY_CARE_PROVIDER_SITE_OTHER): Payer: PPO | Admitting: Family

## 2018-09-10 ENCOUNTER — Ambulatory Visit (HOSPITAL_COMMUNITY)
Admission: RE | Admit: 2018-09-10 | Discharge: 2018-09-10 | Disposition: A | Discharge status: Home / Self Care | Payer: PPO | Source: Ambulatory Visit | Attending: Family | Admitting: Family

## 2018-09-10 VITALS — BP 111/63 | HR 66 | Temp 97.2°F | Resp 18 | Ht 70.0 in | Wt 173.0 lb

## 2018-09-10 DIAGNOSIS — Z87891 Personal history of nicotine dependence: Secondary | ICD-10-CM | POA: Diagnosis not present

## 2018-09-10 DIAGNOSIS — I714 Abdominal aortic aneurysm, without rupture, unspecified: Secondary | ICD-10-CM

## 2018-09-10 NOTE — Patient Instructions (Signed)
Before your next abdominal ultrasound: ° °Avoid gas forming foods and beverages the day before the test.   °Take two Extra-Strength Gas-X capsules at bedtime the night before the test. °Take another two Extra-Strength Gas-X capsules in the middle of the night if you get up to the restroom, if not, first thing in the morning with water.  °Do not chew gum.  ° ° ° ° °Abdominal Aortic Aneurysm ° °An aneurysm is a bulge in one of the blood vessels that carry blood away from the heart (artery). It happens when blood pushes up against a weak or damaged place in the wall of an artery. An abdominal aortic aneurysm happens in the main artery of the body (aorta). °Some aneurysms may not cause problems. If it grows, it can burst or tear, causing bleeding inside the body. This is an emergency. It needs to be treated right away. °What are the causes? °The exact cause of this condition is not known. °What increases the risk? °The following may make you more likely to get this condition: °· Being a male who is 60 years of age or older. °· Being white (Caucasian). °· Using tobacco. °· Having a family history of aneurysms. °· Having the following conditions: °? Hardening of the arteries (arteriosclerosis). °? Inflammation of the walls of an artery (arteritis). °? Certain genetic conditions. °? Being very overweight (obesity). °? An infection in the wall of the aorta (infectious aortitis). °? High cholesterol. °? High blood pressure (hypertension). °What are the signs or symptoms? °Symptoms depend on the size of the aneurysm and how fast it is growing. Most grow slowly and do not cause any symptoms. If symptoms do occur, they may include: °· Pain in the belly (abdomen), side, or back. °· Feeling full after eating only small amounts of food. °· Feeling a throbbing lump in the belly. °Symptoms that the aneurysm has burst (ruptured) include: °· Sudden, very bad pain in the belly, side, or back. °· Feeling sick to your stomach  (nauseous). °· Throwing up (vomiting). °· Feeling light-headed or passing out. °How is this treated? °Treatment for this condition depends on: °· The size of the aneurysm. °· How fast it is growing. °· Your age. °· Your risk of having it burst. °If your aneurysm is smaller than 2 inches (5 cm), your doctor may manage it by: °· Checking it often to see if it is getting bigger. You may have an imaging test (ultrasound) to check it every 3-6 months, every year, or every few years. °· Giving you medicines to: °? Control blood pressure. °? Treat pain. °? Fight infection. °If your aneurysm is larger than 2 inches (5 cm), you may need surgery to fix it. °Follow these instructions at home: °Lifestyle °· Do not use any products that have nicotine or tobacco in them. This includes cigarettes, e-cigarettes, and chewing tobacco. If you need help quitting, ask your doctor. °· Get regular exercise. Ask your doctor what types of exercise are best for you. °Eating and drinking °· Eat a heart-healthy diet. This includes eating plenty of: °? Fresh fruits and vegetables. °? Whole grains. °? Low-fat (lean) protein. °? Low-fat dairy products. °· Avoid foods that are high in saturated fat and cholesterol. These foods include red meat and some dairy products. °· Do not drink alcohol if: °? Your doctor tells you not to drink. °? You are pregnant, may be pregnant, or are planning to become pregnant. °· If you drink alcohol: °? Limit how much you   use to: °§ 0-1 drink a day for women. °§ 0-2 drinks a day for men. °? Be aware of how much alcohol is in your drink. In the [place] one drink equals any of these: °§ One typical bottle of beer (12 oz). °§ One-half glass of wine (5 oz). °§ One shot of hard liquor (1½ oz). °General instructions °· Take over-the-counter and prescription medicines only as told by your doctor. °· Keep your blood pressure within normal limits. Ask your doctor what your blood pressure should be. °· Have your blood sugar  (glucose) level and cholesterol levels checked regularly. Keep your blood sugar level and cholesterol levels within normal limits. °· Avoid heavy lifting and activities that take a lot of effort. Ask your doctor what activities are safe for you. °· Keep all follow-up visits as told by your doctor. This is important. °? Talk to your doctor about regular screenings to see if the aneurysm is getting bigger. °Contact a doctor if you: °· Have pain in your belly, side, or back. °· Have a throbbing feeling in your belly. °· Have a family history of aneurysms. °Get help right away if you: °· Have sudden, bad pain in your belly, side, or back. °· Feel sick to your stomach. °· Throw up. °· Have trouble pooping (constipation). °· Have trouble peeing (urinating). °· Feel light-headed. °· Have a fast heart rate when you stand. °· Have sweaty skin that is cold to the touch (clammy). °· Have shortness of breath. °· Have a fever. °These symptoms may be an emergency. Do not wait to see if the symptoms will go away. Get medical help right away. Call your local emergency services (911 in the [place] Do not drive yourself to the hospital. °Summary °· An aneurysm is a bulge in one of the blood vessels that carry blood away from the heart (artery). Some aneurysms may not cause problems. °· You may need to have yours checked often. If it grows, it can burst or tear. This causes bleeding inside the body. It needs to be treated right away. °· Follow instructions from your doctor about healthy lifestyle changes. °· Keep all follow-up visits as told by your doctor. This is important. °This information is not intended to replace advice given to you by your health care provider. Make sure you discuss any questions you have with your health care provider. °Document Released: 07/05/2012 Document Revised: 10/17/2017 Document Reviewed: 10/17/2017 °Elsevier Interactive Patient Education © 2019 [employer] ° °

## 2018-09-10 NOTE — Progress Notes (Signed)
[facility]    [facility]: Follow up Abdominal Aortic Aneurysm  History of Present Illness  [name] is a 85 y.o. (July 01, 1931) male whom Dr. Donnetta Hutching has been monitoring for an infrarenal abdominal aortic aneurysm.  Previous studies demonstrate an AAA, measuring 4.5 cm. The patient denies back or abdominal pain.   He denies claudication type symtoms in his legs with walking. He denies any history of stroke or TIA symptoms.  He takes Plavix 4 days/week at the request of his cardiologist, Dr. Ellyn Hack and a statin.   He denies dyspnea, denies chest pain. His wife states that pt has a cough at night.   Diabetic: No Tobacco use: former smoker, quit in 1987    Past Medical History:  Diagnosis Date  . Abdominal aortic aneurysm ([place])    being monitored-"checked in Oct. and it stayed the same" - 4.22 x 4.22 cm.  (also on Echo - TAA - 3.7 cm)  . Arthritis   . Atrial fibrillation ([place]) 02/13/2013   Peri-infarction  . CAD S/P percutaneous coronary angioplasty 02/13/2013   PROXIMAL-MID LAD 95-99% AND 80% TANDEM LESIONS - PROMUS PREMIER DES 2.75 MM X 24 MM (2.9 MM)  . LV dysfunction, EF by echo 02/11/13 45-50% - 50-55% as of 06/2014 02/13/2013   Echo April 2016: EF 50-55%.  No RWMA.  Gr 1 DD.  ?Thoracid Aortic dilation ~ 3.7.   . Macular degeneration of left eye   . Prostate disease   . 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 ST elevation myocardial infarction (STEMI) of anterior wall, initial episode of care ([place]) 02/10/2013  . Tremors of nervous system    both hands-under control   Past Surgical History:  Procedure Laterality Date  . APPENDECTOMY  age 85   . CYSTOSCOPY W/ RETROGRADES Bilateral 02/03/2014   Procedure:  BILATERAL RETROGRADE PYELOGRAM;  Surgeon: Festus Aloe, MD;  Location: [facility];  Service: Urology;  Laterality: Bilateral;  . GREEN LIGHT LASER TURP (TRANSURETHRAL RESECTION OF PROSTATE N/A 02/03/2014   Procedure: GREEN LIGHT LASER TURP  (TRANSURETHRAL RESECTION OF PROSTATE PHOTO VAPORIZATION;  Surgeon: Festus Aloe, MD;  Location: [facility];  Service: Urology;  Laterality: N/A;  . HERNIA REPAIR Left 2000  . LEFT HEART CATHETERIZATION WITH CORONARY ANGIOGRAM N/A 02/10/2013   Procedure: LEFT HEART CATHETERIZATION WITH CORONARY ANGIOGRAM;  Surgeon: Leonie Man, MD;  Location: [facility] CATH LAB;  Service: Cardiovascular;  Laterality: N/A;  . PERCUTANEOUS CORONARY STENT INTERVENTION (PCI-S)  02/10/13   PROXIMAL-MID LAD 95-99% AND 80% TANDEM LESIONS - PROMUS PREMIER DES 2.75 MM X 24 MM (2.9 MM)  . TRANSTHORACIC ECHOCARDIOGRAM  06/2014   EF 50-55%.  No RWMA.  Gr 1 DD.  ?Thoracid Aortic dilation ~ 3.7.    Social History Social History   Socioeconomic History  . Marital status: Married    Spouse name: Not on file  . Number of children: Not on file  . Years of education: Not on file  . Highest education level: Not on file  Occupational History  . Not on file  Social Needs  . Financial resource strain: Not on file  . Food insecurity    Worry: Not on file    Inability: Not on file  . Transportation needs    Medical: Not on file    Non-medical: Not on file  Tobacco Use  . Smoking status: Former Smoker    Packs/day: 1.00    Years: 10.00  Pack years: 10.00    Types: Cigarettes    Quit date: 06/07/1983    Years since quitting: 35.2  . Smokeless tobacco: Never Used  Substance and Sexual Activity  . Alcohol use: Yes    Alcohol/week: 0.0 standard drinks    Comment: social  . Drug use: No  . Sexual activity: Not on file  Lifestyle  . Physical activity    Days per week: Not on file    Minutes per session: Not on file  . Stress: Not on file  Relationships  . Social Herbalist on phone: Not on file    Gets together: Not on file    Attends religious service: Not on file    Active member of club or organization: Not on file    Attends meetings of clubs or organizations: Not on file    Relationship status: Not  on file  . Intimate partner violence    Fear of current or ex partner: Not on file    Emotionally abused: Not on file    Physically abused: Not on file    Forced sexual activity: Not on file  Other Topics Concern  . Not on file  Social History Narrative   He is married. Retired.   Quit smoking in 1985. Does not drink alcohol.   Currently ready to graduate from cardiac rehabilitation. Plans to sign up for followup program at the local [employer].   Family History History reviewed. No pertinent family history.  Current Outpatient Medications on File Prior to Visit  Medication Sig Dispense Refill  . alfuzosin (UROXATRAL) 10 MG 24 hr tablet Take 10 mg by mouth daily.  10  . atorvastatin (LIPITOR) 20 MG tablet TK 1 T PO QD  10  . clopidogrel (PLAVIX) 75 MG tablet Take 1 tablet (75 mg total) by mouth daily. 90 tablet 2  . Coenzyme Q10 (Q-10 CO-ENZYME PO) Take 100 mg by mouth every morning.     . dutasteride (AVODART) 0.5 MG capsule TK 1 C PO QD  3  . fluticasone (FLONASE) 50 MCG/ACT nasal spray SPRAY 2 SPRAYS INTO EACH NOSTRIL DAILY AT NIGHT  5  . Glucosamine-Chondroit-Vit C-Mn (GLUCOSAMINE 1500 COMPLEX PO) Take 1,500 mg by mouth 2 (two) times a day.    . levocetirizine (XYZAL) 5 MG tablet TAKE 1 TABLET BY MOUTH ONCE DAILY FOR ALLERGIES.  5  . Loratadine (CLARITIN PO) Take 1 tablet by mouth daily.    Marland Kitchen loteprednol (ALREX) 0.2 % SUSP Place 1-2 drops into the left eye daily as needed (dryness in eye after injection.).    Marland Kitchen Multiple Vitamins-Minerals (ICAPS AREDS 2 PO) Take 1 mg by mouth daily.    . nitroGLYCERIN (NITROSTAT) 0.4 MG SL tablet PLACE 1 TABLET UNDER THE TONGUE EVERY 5 MINUTES AS NEEDED FOR CHEST PAIN 75 tablet 1  . primidone (MYSOLINE) 50 MG tablet Take 50 mg by mouth 2 (two) times daily.     . sodium chloride (OCEAN) 0.65 % SOLN nasal spray Place 1 spray into both nostrils every 12 (twelve) hours as needed for congestion.     No current facility-administered medications on file prior  to visit.    Allergies  Allergen Reactions  . Ciprofloxacin Diarrhea and Nausea Only    ROS: See HPI for pertinent positives and negatives.  Physical Examination  Vitals:   09/10/18 0840  BP: 111/63  Pulse: 66  Resp: 18  Temp: (!) 97.2 F (36.2 C)  TempSrc: Temporal  SpO2: 94%  Weight: 173 lb (78.5 kg)  Height: 5\' 10"  (1.778 m)   Body mass index is 24.82 kg/m.  General: A&O x 3, WD, elderly but fit appearing male. HEENT: Grossly intact and WNL.  Pulmonary: Sym exp, respirations are non labored, good air movement in all fields except left posterior lower 2/3 of fields which have rales and rhonchi, no wheezes (this was present at his November 2019 visit) Cardiac: Regular rhythm and rate, no detected murmur.  Carotid Bruits Right Left   Negative Negative   Adominal aortic pulse is not palpable Radial pulses are 2+ palpable                          VASCULAR EXAM:                                                                                                         LE Pulses Right Left       FEMORAL  2+ palpable  2+ palpable        POPLITEAL  2+ palpable   1+ palpable       POSTERIOR TIBIAL  not palpable   not palpable        DORSALIS PEDIS      ANTERIOR TIBIAL 2+ palpable  2+ palpable     Gastrointestinal: soft, NTND, -G/R, - HSM, - masses palpated, - CVAT B. Musculoskeletal: M/S 5/5 throughout, Extremities without ischemic changes. Skin: No rashes, no ulcers, no cellulitis.   Neurologic: CN 2-12 intact, Pain and light touch intact in extremities are intact, Motor exam as listed above. Psychiatric: Normal thought content, mood appropriate to clinical situation.    DATA  AAA Duplex (09/10/2018):  Previous size: 4.62 cm (Date: 01-18-18); Right CIA: 1.2 cm; Left CIA: 1.0 cm   Current (Date: 09/10/18): Abdominal Aorta Findings: +-----------+-------+----------+----------+--------+--------+--------+ Location   AP (cm)Trans (cm)PSV  (cm/s)WaveformThrombusComments +-----------+-------+----------+----------+--------+--------+--------+ Proximal   2.05   2.00      77        biphasic                 +-----------+-------+----------+----------+--------+--------+--------+ Mid        2.32   2.41      58        biphasic                 +-----------+-------+----------+----------+--------+--------+--------+ Distal     4.61   4.76      60        biphasic                 +-----------+-------+----------+----------+--------+--------+--------+ RT CIA Prox1.1    1.0       115       biphasic                 +-----------+-------+----------+----------+--------+--------+--------+ LT CIA Prox1.0    0.9       184       biphasic                 +-----------+-------+----------+----------+--------+--------+--------+ Summary: Abdominal  Aorta: The largest aortic diameter remains essentially unchanged compared to prior exam. Previous diameter measurement was 4.6 cm obtained on 01/18/2018.  Medical Decision Making  The patient is a 85 y.o. male who presents with asymptomatic AAA with a 1 mm increase in size in eight months, at 4.76 cm today.  Bilateral prominent popliteal pulses: bilateral popliteal artery duplex (01-18-18) shows no popliteal artery aneurysms.   Rales and rhonchi in left posterior, lower 2/3 lung fields, also present at his visit in November 2019. He does not appear dyspneic, SAO2 n RA is 94%. I advised him to speak with his PCP about this as soon as possible.    Based on this patient's exam and diagnostic studies, the patient will follow up in 6 months with the following studies: AAA duplex. Consideration for repair of AAA would be made when the size is 5.0 cm, growth > 1 cm/yr, and symptomatic status.   Based on this patient's exam and diagnostic studies, the patient will follow up in 6 months  with the following studies: AAA duplex.  Consideration for repair of AAA would be made  when the size is 5.5cm, growth > 1 cm/yr, and symptomatic status.  Abdominal aortic aneurysm less than 5-1/2 cm in diameter has less then 1/2% risk of rupture per year.        The patient was given information about AAA including signs, symptoms, treatment, and how to minimize the risk of enlargement and rupture of aneurysms.    I emphasized the importance of maximal medical management including strict control of blood pressure, blood glucose, and lipid levels, antiplatelet agents, obtaining regular exercise, and continued  cessation of smoking.   The patient was advised to call 911 should the patient experience sudden onset abdominal or back pain.   Thank you for allowing [place] to participate in this patient's care.  Clemon Chambers, RN, MSN, FNP-C [facility] [place] Office: [phone]  Clinic Physician: Donzetta Matters  09/10/2018, 9:05 AM

## 2018-09-13 DIAGNOSIS — N401 Enlarged prostate with lower urinary tract symptoms: Secondary | ICD-10-CM | POA: Diagnosis not present

## 2018-09-13 DIAGNOSIS — R3912 Poor urinary stream: Secondary | ICD-10-CM | POA: Diagnosis not present

## 2018-09-15 DIAGNOSIS — H353223 Exudative age-related macular degeneration, left eye, with inactive scar: Secondary | ICD-10-CM | POA: Diagnosis not present

## 2018-09-17 ENCOUNTER — Other Ambulatory Visit: Payer: Self-pay | Admitting: Geriatric Medicine

## 2018-09-17 ENCOUNTER — Ambulatory Visit
Admission: RE | Admit: 2018-09-17 | Discharge: 2018-09-17 | Disposition: A | Discharge status: Home / Self Care | Payer: PPO | Source: Ambulatory Visit | Attending: Geriatric Medicine | Admitting: Geriatric Medicine

## 2018-09-17 DIAGNOSIS — R05 Cough: Secondary | ICD-10-CM

## 2018-09-17 DIAGNOSIS — R059 Cough, unspecified: Secondary | ICD-10-CM

## 2018-09-17 DIAGNOSIS — M25562 Pain in left knee: Secondary | ICD-10-CM | POA: Diagnosis not present

## 2018-09-17 DIAGNOSIS — Z79899 Other long term (current) drug therapy: Secondary | ICD-10-CM | POA: Diagnosis not present

## 2018-09-17 DIAGNOSIS — I10 Essential (primary) hypertension: Secondary | ICD-10-CM | POA: Diagnosis not present

## 2018-09-17 DIAGNOSIS — E7800 Pure hypercholesterolemia, unspecified: Secondary | ICD-10-CM | POA: Diagnosis not present

## 2018-09-17 DIAGNOSIS — J309 Allergic rhinitis, unspecified: Secondary | ICD-10-CM | POA: Diagnosis not present

## 2018-09-17 DIAGNOSIS — I714 Abdominal aortic aneurysm, without rupture: Secondary | ICD-10-CM | POA: Diagnosis not present

## 2018-09-29 DIAGNOSIS — R3914 Feeling of incomplete bladder emptying: Secondary | ICD-10-CM | POA: Diagnosis not present

## 2018-09-29 DIAGNOSIS — R339 Retention of urine, unspecified: Secondary | ICD-10-CM | POA: Diagnosis not present

## 2018-09-30 DIAGNOSIS — N400 Enlarged prostate without lower urinary tract symptoms: Secondary | ICD-10-CM | POA: Diagnosis not present

## 2018-09-30 DIAGNOSIS — I10 Essential (primary) hypertension: Secondary | ICD-10-CM | POA: Diagnosis not present

## 2018-10-12 DIAGNOSIS — H6521 Chronic serous otitis media, right ear: Secondary | ICD-10-CM | POA: Diagnosis not present

## 2018-10-12 DIAGNOSIS — H906 Mixed conductive and sensorineural hearing loss, bilateral: Secondary | ICD-10-CM | POA: Diagnosis not present

## 2018-11-01 DIAGNOSIS — R3914 Feeling of incomplete bladder emptying: Secondary | ICD-10-CM | POA: Diagnosis not present

## 2018-11-01 DIAGNOSIS — R339 Retention of urine, unspecified: Secondary | ICD-10-CM | POA: Diagnosis not present

## 2018-11-05 DIAGNOSIS — M1712 Unilateral primary osteoarthritis, left knee: Secondary | ICD-10-CM | POA: Diagnosis not present

## 2018-11-05 DIAGNOSIS — M1711 Unilateral primary osteoarthritis, right knee: Secondary | ICD-10-CM | POA: Diagnosis not present

## 2018-11-08 DIAGNOSIS — H353223 Exudative age-related macular degeneration, left eye, with inactive scar: Secondary | ICD-10-CM | POA: Diagnosis not present

## 2018-11-17 DIAGNOSIS — I10 Essential (primary) hypertension: Secondary | ICD-10-CM | POA: Diagnosis not present

## 2018-11-17 DIAGNOSIS — N400 Enlarged prostate without lower urinary tract symptoms: Secondary | ICD-10-CM | POA: Diagnosis not present

## 2018-12-02 DIAGNOSIS — R3914 Feeling of incomplete bladder emptying: Secondary | ICD-10-CM | POA: Diagnosis not present

## 2018-12-02 DIAGNOSIS — R339 Retention of urine, unspecified: Secondary | ICD-10-CM | POA: Diagnosis not present

## 2018-12-03 ENCOUNTER — Other Ambulatory Visit: Payer: Self-pay

## 2018-12-03 ENCOUNTER — Encounter: Payer: Self-pay | Admitting: Podiatry

## 2018-12-03 ENCOUNTER — Ambulatory Visit (INDEPENDENT_AMBULATORY_CARE_PROVIDER_SITE_OTHER): Payer: PPO | Admitting: Podiatry

## 2018-12-03 DIAGNOSIS — M79676 Pain in unspecified toe(s): Secondary | ICD-10-CM | POA: Diagnosis not present

## 2018-12-03 DIAGNOSIS — D689 Coagulation defect, unspecified: Secondary | ICD-10-CM | POA: Diagnosis not present

## 2018-12-03 DIAGNOSIS — B351 Tinea unguium: Secondary | ICD-10-CM

## 2018-12-03 NOTE — Progress Notes (Signed)
Complaint:  Visit Type: Patient returns to my office for continued preventative foot care services. Complaint: Patient states" my nails have grown long and thick and become painful to walk and wear shoes"  The patient presents for preventative foot care services. No changes to ROS.  Patient is taking plavix.   Podiatric Exam: Vascular: dorsalis pedis and posterior tibial pulses are palpable bilateral. Capillary return is immediate. Temperature gradient is WNL. Skin turgor WNL  Sensorium: Normal Semmes Weinstein monofilament test. Normal tactile sensation bilaterally. Nail Exam: Pt has thick disfigured discolored nails with subungual debris noted bilateral entire nail hallux through fifth toenails Ulcer Exam: There is no evidence of ulcer or pre-ulcerative changes or infection. Orthopedic Exam: Muscle tone and strength are WNL. No limitations in general ROM. No crepitus or effusions noted. Foot type and digits show no abnormalities. Bony prominences are unremarkable. Skin: No Porokeratosis. No infection or ulcers  Diagnosis:  Onychomycosis, , Pain in right toe, pain in left toes  Treatment & Plan Procedures and Treatment: Consent by patient was obtained for treatment procedures.   Debridement of mycotic and hypertrophic toenails, 1 through 5 bilateral and clearing of subungual debris. No ulceration, no infection noted.  Return Visit-Office Procedure: Patient instructed to return to the office for a follow up visit 3 months for continued evaluation and treatment.    Karolyne Timmons DPM 

## 2018-12-08 DIAGNOSIS — M170 Bilateral primary osteoarthritis of knee: Secondary | ICD-10-CM | POA: Diagnosis not present

## 2018-12-14 DIAGNOSIS — I10 Essential (primary) hypertension: Secondary | ICD-10-CM | POA: Diagnosis not present

## 2018-12-14 DIAGNOSIS — N400 Enlarged prostate without lower urinary tract symptoms: Secondary | ICD-10-CM | POA: Diagnosis not present

## 2018-12-29 DIAGNOSIS — E7800 Pure hypercholesterolemia, unspecified: Secondary | ICD-10-CM | POA: Diagnosis not present

## 2018-12-29 DIAGNOSIS — N400 Enlarged prostate without lower urinary tract symptoms: Secondary | ICD-10-CM | POA: Diagnosis not present

## 2018-12-29 DIAGNOSIS — I10 Essential (primary) hypertension: Secondary | ICD-10-CM | POA: Diagnosis not present

## 2019-01-03 DIAGNOSIS — R339 Retention of urine, unspecified: Secondary | ICD-10-CM | POA: Diagnosis not present

## 2019-01-03 DIAGNOSIS — R3914 Feeling of incomplete bladder emptying: Secondary | ICD-10-CM | POA: Diagnosis not present

## 2019-01-06 DIAGNOSIS — Z23 Encounter for immunization: Secondary | ICD-10-CM | POA: Diagnosis not present

## 2019-01-12 DIAGNOSIS — M170 Bilateral primary osteoarthritis of knee: Secondary | ICD-10-CM | POA: Diagnosis not present

## 2019-02-03 DIAGNOSIS — R339 Retention of urine, unspecified: Secondary | ICD-10-CM | POA: Diagnosis not present

## 2019-02-03 DIAGNOSIS — R3914 Feeling of incomplete bladder emptying: Secondary | ICD-10-CM | POA: Diagnosis not present

## 2019-02-07 DIAGNOSIS — H40002 Preglaucoma, unspecified, left eye: Secondary | ICD-10-CM | POA: Diagnosis not present

## 2019-02-07 DIAGNOSIS — Z961 Presence of intraocular lens: Secondary | ICD-10-CM | POA: Diagnosis not present

## 2019-02-07 DIAGNOSIS — H353223 Exudative age-related macular degeneration, left eye, with inactive scar: Secondary | ICD-10-CM | POA: Diagnosis not present

## 2019-02-07 DIAGNOSIS — H53412 Scotoma involving central area, left eye: Secondary | ICD-10-CM | POA: Diagnosis not present

## 2019-02-07 DIAGNOSIS — H353112 Nonexudative age-related macular degeneration, right eye, intermediate dry stage: Secondary | ICD-10-CM | POA: Diagnosis not present

## 2019-02-07 DIAGNOSIS — H35721 Serous detachment of retinal pigment epithelium, right eye: Secondary | ICD-10-CM | POA: Diagnosis not present

## 2019-02-15 DIAGNOSIS — E7800 Pure hypercholesterolemia, unspecified: Secondary | ICD-10-CM | POA: Diagnosis not present

## 2019-02-15 DIAGNOSIS — N400 Enlarged prostate without lower urinary tract symptoms: Secondary | ICD-10-CM | POA: Diagnosis not present

## 2019-02-15 DIAGNOSIS — I10 Essential (primary) hypertension: Secondary | ICD-10-CM | POA: Diagnosis not present

## 2019-03-02 ENCOUNTER — Other Ambulatory Visit: Payer: Self-pay

## 2019-03-02 ENCOUNTER — Ambulatory Visit: Payer: PPO | Admitting: Podiatry

## 2019-03-02 ENCOUNTER — Encounter: Payer: Self-pay | Admitting: Podiatry

## 2019-03-02 DIAGNOSIS — D689 Coagulation defect, unspecified: Secondary | ICD-10-CM | POA: Diagnosis not present

## 2019-03-02 DIAGNOSIS — M79676 Pain in unspecified toe(s): Secondary | ICD-10-CM | POA: Diagnosis not present

## 2019-03-02 DIAGNOSIS — B351 Tinea unguium: Secondary | ICD-10-CM

## 2019-03-02 NOTE — Progress Notes (Signed)
Complaint:  Visit Type: Patient returns to my office for continued preventative foot care services. Complaint: Patient states" my nails have grown long and thick and become painful to walk and wear shoes"  The patient presents for preventative foot care services. No changes to ROS.  Patient is taking plavix.   Podiatric Exam: Vascular: dorsalis pedis and posterior tibial pulses are palpable bilateral. Capillary return is immediate. Temperature gradient is WNL. Skin turgor WNL  Sensorium: Normal Semmes Weinstein monofilament test. Normal tactile sensation bilaterally. Nail Exam: Pt has thick disfigured discolored nails with subungual debris noted bilateral entire nail hallux through fifth toenails Ulcer Exam: There is no evidence of ulcer or pre-ulcerative changes or infection. Orthopedic Exam: Muscle tone and strength are WNL. No limitations in general ROM. No crepitus or effusions noted. Foot type and digits show no abnormalities. Bony prominences are unremarkable. Skin: No Porokeratosis. No infection or ulcers  Diagnosis:  Onychomycosis, , Pain in right toe, pain in left toes  Treatment & Plan Procedures and Treatment: Consent by patient was obtained for treatment procedures.   Debridement of mycotic and hypertrophic toenails, 1 through 5 bilateral and clearing of subungual debris. No ulceration, no infection noted.  Return Visit-Office Procedure: Patient instructed to return to the office for a follow up visit 4 months for continued evaluation and treatment.    Gardiner Barefoot DPM

## 2019-03-07 DIAGNOSIS — R3914 Feeling of incomplete bladder emptying: Secondary | ICD-10-CM | POA: Diagnosis not present

## 2019-03-07 DIAGNOSIS — R339 Retention of urine, unspecified: Secondary | ICD-10-CM | POA: Diagnosis not present

## 2019-03-08 DIAGNOSIS — N400 Enlarged prostate without lower urinary tract symptoms: Secondary | ICD-10-CM | POA: Diagnosis not present

## 2019-03-08 DIAGNOSIS — E7800 Pure hypercholesterolemia, unspecified: Secondary | ICD-10-CM | POA: Diagnosis not present

## 2019-03-08 DIAGNOSIS — I10 Essential (primary) hypertension: Secondary | ICD-10-CM | POA: Diagnosis not present

## 2019-03-14 ENCOUNTER — Ambulatory Visit: Payer: PPO

## 2019-03-25 DIAGNOSIS — I714 Abdominal aortic aneurysm, without rupture, unspecified: Secondary | ICD-10-CM

## 2019-03-25 HISTORY — PX: OTHER SURGICAL HISTORY: SHX169

## 2019-03-25 HISTORY — PX: NM MYOVIEW LTD: HXRAD82

## 2019-03-25 HISTORY — PX: TRANSTHORACIC ECHOCARDIOGRAM: SHX275

## 2019-03-25 HISTORY — DX: Abdominal aortic aneurysm, without rupture: I71.4

## 2019-03-25 HISTORY — DX: Abdominal aortic aneurysm, without rupture, unspecified: I71.40

## 2019-03-30 DIAGNOSIS — I10 Essential (primary) hypertension: Secondary | ICD-10-CM | POA: Diagnosis not present

## 2019-03-30 DIAGNOSIS — I714 Abdominal aortic aneurysm, without rupture: Secondary | ICD-10-CM | POA: Diagnosis not present

## 2019-03-30 DIAGNOSIS — M25562 Pain in left knee: Secondary | ICD-10-CM | POA: Diagnosis not present

## 2019-03-30 DIAGNOSIS — M25561 Pain in right knee: Secondary | ICD-10-CM | POA: Diagnosis not present

## 2019-03-30 DIAGNOSIS — J3089 Other allergic rhinitis: Secondary | ICD-10-CM | POA: Diagnosis not present

## 2019-04-11 DIAGNOSIS — H353221 Exudative age-related macular degeneration, left eye, with active choroidal neovascularization: Secondary | ICD-10-CM | POA: Diagnosis not present

## 2019-04-11 DIAGNOSIS — H52203 Unspecified astigmatism, bilateral: Secondary | ICD-10-CM | POA: Diagnosis not present

## 2019-04-11 DIAGNOSIS — Z961 Presence of intraocular lens: Secondary | ICD-10-CM | POA: Diagnosis not present

## 2019-04-13 DIAGNOSIS — H353223 Exudative age-related macular degeneration, left eye, with inactive scar: Secondary | ICD-10-CM | POA: Diagnosis not present

## 2019-04-14 ENCOUNTER — Observation Stay (HOSPITAL_BASED_OUTPATIENT_CLINIC_OR_DEPARTMENT_OTHER): Payer: PPO

## 2019-04-14 ENCOUNTER — Emergency Department (HOSPITAL_COMMUNITY): Payer: PPO

## 2019-04-14 ENCOUNTER — Other Ambulatory Visit: Payer: Self-pay

## 2019-04-14 ENCOUNTER — Observation Stay (HOSPITAL_COMMUNITY)
Admission: EM | Admit: 2019-04-14 | Discharge: 2019-04-14 | Disposition: A | Discharge status: Home / Self Care | Payer: PPO | Attending: Internal Medicine | Admitting: Internal Medicine

## 2019-04-14 ENCOUNTER — Encounter (HOSPITAL_COMMUNITY): Payer: Self-pay

## 2019-04-14 DIAGNOSIS — I739 Peripheral vascular disease, unspecified: Secondary | ICD-10-CM

## 2019-04-14 DIAGNOSIS — I700 Atherosclerosis of aorta: Secondary | ICD-10-CM

## 2019-04-14 DIAGNOSIS — R030 Elevated blood-pressure reading, without diagnosis of hypertension: Secondary | ICD-10-CM | POA: Diagnosis present

## 2019-04-14 DIAGNOSIS — I25110 Atherosclerotic heart disease of native coronary artery with unstable angina pectoris: Secondary | ICD-10-CM | POA: Diagnosis not present

## 2019-04-14 DIAGNOSIS — I959 Hypotension, unspecified: Secondary | ICD-10-CM | POA: Diagnosis not present

## 2019-04-14 DIAGNOSIS — I5022 Chronic systolic (congestive) heart failure: Secondary | ICD-10-CM | POA: Insufficient documentation

## 2019-04-14 DIAGNOSIS — Z955 Presence of coronary angioplasty implant and graft: Secondary | ICD-10-CM | POA: Insufficient documentation

## 2019-04-14 DIAGNOSIS — I313 Pericardial effusion (noninflammatory): Secondary | ICD-10-CM | POA: Insufficient documentation

## 2019-04-14 DIAGNOSIS — Z20822 Contact with and (suspected) exposure to covid-19: Secondary | ICD-10-CM | POA: Insufficient documentation

## 2019-04-14 DIAGNOSIS — Z881 Allergy status to other antibiotic agents status: Secondary | ICD-10-CM | POA: Diagnosis not present

## 2019-04-14 DIAGNOSIS — I480 Paroxysmal atrial fibrillation: Principal | ICD-10-CM

## 2019-04-14 DIAGNOSIS — I110 Hypertensive heart disease with heart failure: Secondary | ICD-10-CM | POA: Insufficient documentation

## 2019-04-14 DIAGNOSIS — M1990 Unspecified osteoarthritis, unspecified site: Secondary | ICD-10-CM | POA: Diagnosis not present

## 2019-04-14 DIAGNOSIS — I714 Abdominal aortic aneurysm, without rupture: Secondary | ICD-10-CM | POA: Diagnosis not present

## 2019-04-14 DIAGNOSIS — R3914 Feeling of incomplete bladder emptying: Secondary | ICD-10-CM | POA: Diagnosis not present

## 2019-04-14 DIAGNOSIS — Z79899 Other long term (current) drug therapy: Secondary | ICD-10-CM | POA: Insufficient documentation

## 2019-04-14 DIAGNOSIS — E1165 Type 2 diabetes mellitus with hyperglycemia: Secondary | ICD-10-CM | POA: Insufficient documentation

## 2019-04-14 DIAGNOSIS — Z87891 Personal history of nicotine dependence: Secondary | ICD-10-CM | POA: Diagnosis not present

## 2019-04-14 DIAGNOSIS — J439 Emphysema, unspecified: Secondary | ICD-10-CM | POA: Diagnosis not present

## 2019-04-14 DIAGNOSIS — I2510 Atherosclerotic heart disease of native coronary artery without angina pectoris: Secondary | ICD-10-CM | POA: Diagnosis not present

## 2019-04-14 DIAGNOSIS — I4891 Unspecified atrial fibrillation: Secondary | ICD-10-CM

## 2019-04-14 DIAGNOSIS — J449 Chronic obstructive pulmonary disease, unspecified: Secondary | ICD-10-CM | POA: Diagnosis present

## 2019-04-14 DIAGNOSIS — R072 Precordial pain: Secondary | ICD-10-CM | POA: Diagnosis not present

## 2019-04-14 DIAGNOSIS — I5033 Acute on chronic diastolic (congestive) heart failure: Secondary | ICD-10-CM | POA: Diagnosis present

## 2019-04-14 DIAGNOSIS — R079 Chest pain, unspecified: Secondary | ICD-10-CM

## 2019-04-14 DIAGNOSIS — R000 Tachycardia, unspecified: Secondary | ICD-10-CM | POA: Diagnosis not present

## 2019-04-14 DIAGNOSIS — E785 Hyperlipidemia, unspecified: Secondary | ICD-10-CM | POA: Diagnosis not present

## 2019-04-14 DIAGNOSIS — R0789 Other chest pain: Secondary | ICD-10-CM | POA: Diagnosis not present

## 2019-04-14 DIAGNOSIS — Z7902 Long term (current) use of antithrombotics/antiplatelets: Secondary | ICD-10-CM | POA: Diagnosis not present

## 2019-04-14 DIAGNOSIS — R739 Hyperglycemia, unspecified: Secondary | ICD-10-CM | POA: Diagnosis present

## 2019-04-14 DIAGNOSIS — I252 Old myocardial infarction: Secondary | ICD-10-CM | POA: Diagnosis not present

## 2019-04-14 DIAGNOSIS — I5032 Chronic diastolic (congestive) heart failure: Secondary | ICD-10-CM | POA: Diagnosis present

## 2019-04-14 DIAGNOSIS — R339 Retention of urine, unspecified: Secondary | ICD-10-CM | POA: Diagnosis not present

## 2019-04-14 LAB — ECHOCARDIOGRAM COMPLETE
Height: 70 in
Weight: 2928 oz

## 2019-04-14 LAB — CBC
HCT: 43.3 % (ref 39.0–52.0)
Hemoglobin: 14.1 g/dL (ref 13.0–17.0)
MCH: 30.8 pg (ref 26.0–34.0)
MCHC: 32.6 g/dL (ref 30.0–36.0)
MCV: 94.5 fL (ref 80.0–100.0)
Platelets: 155 10*3/uL (ref 150–400)
RBC: 4.58 MIL/uL (ref 4.22–5.81)
RDW: 13.9 % (ref 11.5–15.5)
WBC: 10.1 10*3/uL (ref 4.0–10.5)
nRBC: 0 % (ref 0.0–0.2)

## 2019-04-14 LAB — TSH: TSH: 3.255 u[IU]/mL (ref 0.350–4.500)

## 2019-04-14 LAB — BASIC METABOLIC PANEL
Anion gap: 10 (ref 5–15)
BUN: 20 mg/dL (ref 8–23)
CO2: 23 mmol/L (ref 22–32)
Calcium: 9.1 mg/dL (ref 8.9–10.3)
Chloride: 105 mmol/L (ref 98–111)
Creatinine, Ser: 1.23 mg/dL (ref 0.61–1.24)
GFR calc Af Amer: >60 mL/min (ref >60)
GFR calc non Af Amer: 53 mL/min — ABNORMAL LOW (ref >60)
Glucose, Bld: 141 mg/dL — ABNORMAL HIGH (ref 70–99)
Potassium: 3.9 mmol/L (ref 3.5–5.1)
Sodium: 138 mmol/L (ref 135–145)

## 2019-04-14 LAB — NM MYOCAR MULTI W/SPECT W/WALL MOTION / EF
Estimated workload: 1 METS
MPHR: 135 {beats}/min
Peak HR: 118 {beats}/min
Percent HR: 87 %
Rest HR: 90 {beats}/min

## 2019-04-14 LAB — TROPONIN I (HIGH SENSITIVITY)
Troponin I (High Sensitivity): 8 ng/L (ref <18)
Troponin I (High Sensitivity): 9 ng/L (ref <18)

## 2019-04-14 LAB — HEPARIN LEVEL (UNFRACTIONATED): Heparin Unfractionated: 0.49 IU/mL (ref 0.30–0.70)

## 2019-04-14 LAB — HEMOGLOBIN A1C
Hgb A1c MFr Bld: 5.7 % — ABNORMAL HIGH (ref 4.8–5.6)
Mean Plasma Glucose: 116.89 mg/dL

## 2019-04-14 LAB — SARS CORONAVIRUS 2 (TAT 6-24 HRS): SARS Coronavirus 2: NEGATIVE

## 2019-04-14 MED ORDER — LORATADINE 10 MG PO TABS
10.0000 mg | ORAL_TABLET | Freq: Every day | ORAL | Status: DC
  Administered 2019-04-14: 10 mg via ORAL
  Filled 2019-04-14: qty 1

## 2019-04-14 MED ORDER — DUTASTERIDE 0.5 MG PO CAPS
0.5000 mg | ORAL_CAPSULE | Freq: Every day | ORAL | Status: DC
  Administered 2019-04-14: 0.5 mg via ORAL
  Filled 2019-04-14: qty 1

## 2019-04-14 MED ORDER — ATORVASTATIN CALCIUM 10 MG PO TABS
20.0000 mg | ORAL_TABLET | Freq: Every day | ORAL | Status: DC
  Administered 2019-04-14: 20 mg via ORAL
  Filled 2019-04-14: qty 2

## 2019-04-14 MED ORDER — FENTANYL CITRATE (PF) 100 MCG/2ML IJ SOLN
50.0000 ug | Freq: Once | INTRAMUSCULAR | Status: DC
  Filled 2019-04-14: qty 2

## 2019-04-14 MED ORDER — REGADENOSON 0.4 MG/5ML IV SOLN
INTRAVENOUS | Status: AC
  Filled 2019-04-14: qty 5

## 2019-04-14 MED ORDER — LEVOCETIRIZINE DIHYDROCHLORIDE 5 MG PO TABS: 5.0000 mg | ORAL_TABLET | Freq: Every evening | ORAL | Status: DC

## 2019-04-14 MED ORDER — FENTANYL CITRATE (PF) 100 MCG/2ML IJ SOLN
50.0000 ug | Freq: Once | INTRAMUSCULAR | Status: AC
  Administered 2019-04-14: 50 ug via INTRAVENOUS
  Filled 2019-04-14: qty 2

## 2019-04-14 MED ORDER — ASPIRIN 81 MG PO CHEW
324.0000 mg | CHEWABLE_TABLET | Freq: Once | ORAL | Status: AC
  Administered 2019-04-14: 324 mg via ORAL
  Filled 2019-04-14: qty 4

## 2019-04-14 MED ORDER — PRIMIDONE 50 MG PO TABS
50.0000 mg | ORAL_TABLET | Freq: Two times a day (BID) | ORAL | Status: DC
  Administered 2019-04-14: 50 mg via ORAL
  Filled 2019-04-14 (×2): qty 1

## 2019-04-14 MED ORDER — HEPARIN (PORCINE) 25000 UT/250ML-% IV SOLN: 12.0000 [IU]/kg/h | INTRAVENOUS | Status: DC

## 2019-04-14 MED ORDER — SODIUM CHLORIDE 0.9 % IV BOLUS
500.0000 mL | Freq: Once | INTRAVENOUS | Status: AC
  Administered 2019-04-14: 250 mL via INTRAVENOUS

## 2019-04-14 MED ORDER — REGADENOSON 0.4 MG/5ML IV SOLN
0.4000 mg | Freq: Once | INTRAVENOUS | Status: AC
  Administered 2019-04-14: 13:00:00 0.4 mg via INTRAVENOUS
  Filled 2019-04-14: qty 5

## 2019-04-14 MED ORDER — APIXABAN 5 MG PO TABS
5.0000 mg | ORAL_TABLET | Freq: Two times a day (BID) | ORAL | Status: DC
  Filled 2019-04-14: qty 1

## 2019-04-14 MED ORDER — ONDANSETRON HCL 4 MG/2ML IJ SOLN
4.0000 mg | Freq: Once | INTRAMUSCULAR | Status: AC
  Administered 2019-04-14: 4 mg via INTRAVENOUS
  Filled 2019-04-14: qty 2

## 2019-04-14 MED ORDER — ACETAMINOPHEN 325 MG PO TABS: 650.0000 mg | ORAL_TABLET | ORAL | Status: DC | PRN

## 2019-04-14 MED ORDER — SODIUM CHLORIDE 0.9 % IV SOLN: INTRAVENOUS | Status: DC

## 2019-04-14 MED ORDER — IOHEXOL 350 MG/ML SOLN
80.0000 mL | Freq: Once | INTRAVENOUS | Status: AC | PRN
  Administered 2019-04-14: 80 mL via INTRAVENOUS

## 2019-04-14 MED ORDER — HEPARIN (PORCINE) 25000 UT/250ML-% IV SOLN
1200.0000 [IU]/h | INTRAVENOUS | Status: DC
  Administered 2019-04-14: 1200 [IU]/h via INTRAVENOUS
  Filled 2019-04-14: qty 250

## 2019-04-14 MED ORDER — ONDANSETRON HCL 4 MG/2ML IJ SOLN: 4.0000 mg | Freq: Four times a day (QID) | INTRAMUSCULAR | Status: DC | PRN

## 2019-04-14 MED ORDER — HEPARIN BOLUS VIA INFUSION
4000.0000 [IU] | Freq: Once | INTRAVENOUS | Status: AC
  Administered 2019-04-14: 4000 [IU] via INTRAVENOUS
  Filled 2019-04-14: qty 4000

## 2019-04-14 MED ORDER — ALFUZOSIN HCL ER 10 MG PO TB24
10.0000 mg | ORAL_TABLET | Freq: Every day | ORAL | Status: DC
  Administered 2019-04-14: 10 mg via ORAL
  Filled 2019-04-14: qty 1

## 2019-04-14 MED ORDER — TECHNETIUM TC 99M TETROFOSMIN IV KIT
32.7000 | PACK | Freq: Once | INTRAVENOUS | Status: AC | PRN
  Administered 2019-04-14: 32.7 via INTRAVENOUS

## 2019-04-14 MED ORDER — CLOPIDOGREL BISULFATE 75 MG PO TABS
75.0000 mg | ORAL_TABLET | Freq: Every day | ORAL | Status: DC
  Administered 2019-04-14: 75 mg via ORAL
  Filled 2019-04-14: qty 1

## 2019-04-14 MED ORDER — TECHNETIUM TC 99M TETROFOSMIN IV KIT
10.7000 | PACK | Freq: Once | INTRAVENOUS | Status: AC | PRN
  Administered 2019-04-14: 10.7 via INTRAVENOUS

## 2019-04-14 MED ORDER — APIXABAN 5 MG PO TABS: 5.0000 mg | ORAL_TABLET | Freq: Two times a day (BID) | ORAL | 0 refills | Status: DC

## 2019-04-14 NOTE — Progress Notes (Signed)
  Echocardiogram 2D Echocardiogram has been performed.  [name] 04/14/2019, 3:22 PM

## 2019-04-14 NOTE — Progress Notes (Signed)
1 day lexiscan myoview completed. SBP dropped from 105 to 70 after lexiscan injection. Given 244ml IVF. He was asymptomatic with BP drop. Pending final report this afternoon by [facility] reader.   Hilbert Corrigan PA Pager: [phone]
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
## 2019-04-14 NOTE — Progress Notes (Signed)
Per Dr. Sallyanne Kuster, Mr. Yazell covid test was negative. Myoview also negative. Can go home on DOAC and stop plavix.   Followup with Dr. Ellyn Hack on 2/10 at [place] as previous scheduled
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
## 2019-04-14 NOTE — Progress Notes (Signed)
[place] for heparin Indication: atrial fibrillation  Allergies  Allergen Reactions  . Ciprofloxacin Diarrhea and Nausea Only    Patient Measurements: Height: 5\' 10"  (177.8 cm) Weight: 183 lb (83 kg) IBW/kg (Calculated) : 73  Vital Signs: Temp: 97.4 F (36.3 C) (01/21 1042) Temp Source: Oral (01/21 1042) BP: 108/66 (01/21 1042) Pulse Rate: 108 (01/21 1042)  Labs: Recent Labs    04/14/19 0209 04/14/19 0406 04/14/19 1200  HGB 14.1  --   --   HCT 43.3  --   --   PLT 155  --   --   HEPARINUNFRC  --   --  0.49  CREATININE 1.23  --   --   TROPONINIHS 8 9  --     Estimated Creatinine Clearance: 45.3 mL/min (by C-G formula based on SCr of 1.23 mg/dL).   Medical History: Past Medical History:  Diagnosis Date  . Abdominal aortic aneurysm ([place])    being monitored-"checked in Oct. and it stayed the same" - 4.22 x 4.22 cm.  (also on Echo - TAA - 3.7 cm)  . Arthritis   . Atrial fibrillation ([place]) 02/13/2013   Peri-infarction  . CAD S/P percutaneous coronary angioplasty 02/13/2013   PROXIMAL-MID LAD 95-99% AND 80% TANDEM LESIONS - PROMUS PREMIER DES 2.75 MM X 24 MM (2.9 MM)  . LV dysfunction, EF by echo 02/11/13 45-50% - 50-55% as of 06/2014 02/13/2013   Echo April 2016: EF 50-55%.  No RWMA.  Gr 1 DD.  ?Thoracid Aortic dilation ~ 3.7.   . Macular degeneration of left eye   . Prostate disease   . 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 ST elevation myocardial infarction (STEMI) of anterior wall, initial episode of care ([place]) 02/10/2013  . Tremors of nervous system    both hands-under control     Assessment: 86yo male c/o CP since 4p, found to be in Afib, no known h/o Afib other than peri-infarct with no known recurrence. Pharmacy dosing heparin -heparin level at goal  Goal of Therapy:  Heparin level 0.3-0.7 units/ml Monitor platelets by anticoagulation protocol: Yes   Plan:  -Continue heparin at 1200 units/hr -Daily heparin  level and CBC  Hildred Laser, PharmD Clinical Pharmacist **Pharmacist phone directory can now be found on [email or web address] ([email or web address]).  Listed under [place].

## 2019-04-14 NOTE — Progress Notes (Signed)
Attempted echo.  Patient to be going to stress test imminently so will hold off till after stress test per conversation w/ nurse

## 2019-04-14 NOTE — Plan of Care (Signed)
Patient discharged and patients wife will be here around 1800.

## 2019-04-14 NOTE — Discharge Instructions (Signed)
Atrial Fibrillation  Atrial fibrillation is a type of heartbeat that is irregular or fast. If you have this condition, your heart beats without any order. This makes it hard for your heart to pump blood in a normal way. Atrial fibrillation may come and go, or it may become a long-lasting problem. If this condition is not treated, it can put you at higher risk for stroke, heart failure, and other heart problems. What are the causes? This condition may be caused by diseases that damage the heart. They include:  High blood pressure.  Heart failure.  Heart valve disease.  Heart surgery. Other causes include:  Diabetes.  Thyroid disease.  Being overweight.  Kidney disease. Sometimes the cause is not known. What increases the risk? You are more likely to develop this condition if:  You are older.  You smoke.  You exercise often and very hard.  You have a family history of this condition.  You are a man.  You use drugs.  You drink a lot of alcohol.  You have lung conditions, such as emphysema, pneumonia, or COPD.  You have sleep apnea. What are the signs or symptoms? Common symptoms of this condition include:  A feeling that your heart is beating very fast.  Chest pain or discomfort.  Feeling short of breath.  Suddenly feeling light-headed or weak.  Getting tired easily during activity.  Fainting.  Sweating. In some cases, there are no symptoms. How is this treated? Treatment for this condition depends on underlying conditions and how you feel when you have atrial fibrillation. They include:  Medicines to: ? Prevent blood clots. ? Treat heart rate or heart rhythm problems.  Using devices, such as a pacemaker, to correct heart rhythm problems.  Doing surgery to remove the part of the heart that sends bad signals.  Closing an area where clots can form in the heart (left atrial appendage). In some cases, your doctor will treat other underlying  conditions. Follow these instructions at home: Medicines  Take over-the-counter and prescription medicines only as told by your doctor.  Do not take any new medicines without first talking to your doctor.  If you are taking blood thinners: ? Talk with your doctor before you take any medicines that have aspirin or NSAIDs, such as ibuprofen, in them. ? Take your medicine exactly as told by your doctor. Take it at the same time each day. ? Avoid activities that could hurt or bruise you. Follow instructions about how to prevent falls. ? Wear a bracelet that says you are taking blood thinners. Or, carry a card that lists what medicines you take. Lifestyle      Do not use any products that have nicotine or tobacco in them. These include cigarettes, e-cigarettes, and chewing tobacco. If you need help quitting, ask your doctor.  Eat heart-healthy foods. Talk with your doctor about the right eating plan for you.  Exercise regularly as told by your doctor.  Do not drink alcohol.  Lose weight if you are overweight.  Do not use drugs, including cannabis. General instructions  If you have a condition that causes breathing to stop for a short period of time (apnea), treat it as told by your doctor.  Keep a healthy weight. Do not use diet pills unless your doctor says they are safe for you. Diet pills may make heart problems worse.  Keep all follow-up visits as told by your doctor. This is important. Contact a doctor if:  You notice a change   in the speed, rhythm, or strength of your heartbeat.  You are taking a blood-thinning medicine and you get more bruising.  You get tired more easily when you move or exercise.  You have a sudden change in weight. Get help right away if:   You have pain in your chest or your belly (abdomen).  You have trouble breathing.  You have side effects of blood thinners, such as blood in your vomit, poop (stool), or pee (urine), or bleeding that cannot  stop.  You have any signs of a stroke. "BE FAST" is an easy way to remember the main warning signs: ? B - Balance. Signs are dizziness, sudden trouble walking, or loss of balance. ? E - Eyes. Signs are trouble seeing or a change in how you see. ? F - Face. Signs are sudden weakness or loss of feeling in the face, or the face or eyelid drooping on one side. ? A - Arms. Signs are weakness or loss of feeling in an arm. This happens suddenly and usually on one side of the body. ? S - Speech. Signs are sudden trouble speaking, slurred speech, or trouble understanding what people say. ? T - Time. Time to call emergency services. Write down what time symptoms started.  You have other signs of a stroke, such as: ? A sudden, very bad headache with no known cause. ? Feeling like you may vomit (nausea). ? Vomiting. ? A seizure. These symptoms may be an emergency. Do not wait to see if the symptoms will go away. Get medical help right away. Call your local emergency services (911 in the [place] Do not drive yourself to the hospital. Summary  Atrial fibrillation is a type of heartbeat that is irregular or fast.  You are at higher risk of this condition if you smoke, are older, have diabetes, or are overweight.  Follow your doctor's instructions about medicines, diet, exercise, and follow-up visits.  Get help right away if you have signs or symptoms of a stroke.  Get help right away if you cannot catch your breath, or you have chest pain or discomfort. This information is not intended to replace advice given to you by your health care provider. Make sure you discuss any questions you have with your health care provider. Document Revised: 09/01/2018 Document Reviewed: 09/01/2018 Elsevier Patient Education  [place].   Atrial Fibrillation  Atrial fibrillation is a type of irregular or rapid heartbeat (arrhythmia). In atrial fibrillation, the top part of the heart (atria) beats in an  irregular pattern. This makes the heart unable to pump blood normally and effectively. The goal of treatment is to prevent blood clots from forming, control your heart rate, or restore your heartbeat to a normal rhythm. If this condition is not treated, it can cause serious problems, such as a weakened heart muscle (cardiomyopathy) or a stroke. What are the causes? This condition is often caused by medical conditions that damage the heart's electrical system. These include:  High blood pressure (hypertension). This is the most common cause.  Certain heart problems or conditions, such as heart failure, coronary artery disease, heart valve problems, or heart surgery.  Diabetes.  Overactive thyroid (hyperthyroidism).  Obesity.  Chronic kidney disease. In some cases, the cause of this condition is not known. What increases the risk? This condition is more likely to develop in:  Older people.  People who smoke.  Athletes who do endurance exercise.  People who have a family history of atrial  fibrillation.  Men.  People who use drugs.  People who drink a lot of alcohol.  People who have lung conditions, such as emphysema, pneumonia, or COPD.  People who have obstructive sleep apnea. What are the signs or symptoms? Symptoms of this condition include:  A feeling that your heart is racing or beating irregularly.  Discomfort or pain in your chest.  Shortness of breath.  Sudden light-headedness or weakness.  Tiring easily during exercise or activity.  Fatigue.  Syncope (fainting).  Sweating. In some cases, there are no symptoms. How is this diagnosed? Your health care provider may detect atrial fibrillation when taking your pulse. If detected, this condition may be diagnosed with:  An electrocardiogram (ECG) to check electrical signals of the heart.  An ambulatory cardiac monitor to record your heart's activity for a few days.  A transthoracic echocardiogram (TTE)  to create pictures of your heart.  A transesophageal echocardiogram (TEE) to create even closer pictures of your heart.  A stress test to check your blood supply while you exercise.  Imaging tests, such as a CT scan or chest X-ray.  Blood tests. How is this treated? Treatment depends on underlying conditions and how you feel when you experience atrial fibrillation. This condition may be treated with:  Medicines to prevent blood clots or to treat heart rate or heart rhythm problems.  Electrical cardioversion to reset the heart's rhythm.  A pacemaker to correct abnormal heart rhythm.  Ablation to remove the heart tissue that sends abnormal signals.  Left atrial appendage closure to seal the area where blood clots can form. In some cases, underlying conditions will be treated. Follow these instructions at home: Medicines  Take over-the counter and prescription medicines only as told by your health care provider.  Do not take any new medicines without talking to your health care provider.  If you are taking blood thinners: ? Talk with your health care provider before you take any medicines that contain aspirin or NSAIDs, such as ibuprofen. These medicines increase your risk for dangerous bleeding. ? Take your medicine exactly as told, at the same time every day. ? Avoid activities that could cause injury or bruising, and follow instructions about how to prevent falls. ? Wear a medical alert bracelet or carry a card that lists what medicines you take. Lifestyle      Do not use any products that contain nicotine or tobacco, such as cigarettes, e-cigarettes, and chewing tobacco. If you need help quitting, ask your health care provider.  Eat heart-healthy foods. Talk with a dietitian to make an eating plan that is right for you.  Exercise regularly as told by your health care provider.  Do not drink alcohol.  Lose weight if you are overweight.  Do not use drugs, including  cannabis. General instructions  If you have obstructive sleep apnea, manage your condition as told by your health care provider.  Do not use diet pills unless your health care provider approves. Diet pills can make heart problems worse.  Keep all follow-up visits as told by your health care provider. This is important. Contact a health care provider if you:  Notice a change in the rate, rhythm, or strength of your heartbeat.  Are taking a blood thinner and you notice more bruising.  Tire more easily when you exercise or do heavy work.  Have a sudden change in weight. Get help right away if you have:   Chest pain, abdominal pain, sweating, or weakness.  Trouble breathing.  Side effects of blood thinners, such as blood in your vomit, stool, or urine, or bleeding that cannot stop.  Any symptoms of a stroke. "BE FAST" is an easy way to remember the main warning signs of a stroke: ? B - Balance. Signs are dizziness, sudden trouble walking, or loss of balance. ? E - Eyes. Signs are trouble seeing or a sudden change in vision. ? F - Face. Signs are sudden weakness or numbness of the face, or the face or eyelid drooping on one side. ? A - Arms. Signs are weakness or numbness in an arm. This happens suddenly and usually on one side of the body. ? S - Speech. Signs are sudden trouble speaking, slurred speech, or trouble understanding what people say. ? T - Time. Time to call emergency services. Write down what time symptoms started.  Other signs of a stroke, such as: ? A sudden, severe headache with no known cause. ? Nausea or vomiting. ? Seizure. These symptoms may represent a serious problem that is an emergency. Do not wait to see if the symptoms will go away. Get medical help right away. Call your local emergency services (911 in the [place] Do not drive yourself to the hospital. Summary  Atrial fibrillation is a type of irregular or rapid heartbeat (arrhythmia).  Symptoms include  a feeling that your heart is beating fast or irregularly.  You may be given medicines to prevent blood clots or to treat heart rate or heart rhythm problems.  Get help right away if you have signs or symptoms of a stroke.  Get help right away if you cannot catch your breath or have chest pain or pressure. This information is not intended to replace advice given to you by your health care provider. Make sure you discuss any questions you have with your health care provider. Document Revised: 09/01/2018 Document Reviewed: 09/01/2018 Elsevier Patient Education  [place].

## 2019-04-14 NOTE — TOC Progression Note (Signed)
Transition of Care [facility]) - Progression Note    Patient Details  Name: [name] MRN: [id number] Date of Birth: 03-17-1932  [facility] CM/SW Contact  Zenon Mayo, RN Phone Number: 04/14/2019, 4:58 PM  Clinical Narrative:    NCM gave patient the 30 day free eliquis coupon, and the pharmacist states he will have a copay of 45.00 for 30 day supply .  NCM informed patient of this information.  He is for dc today.        Expected Discharge Plan and Services           Expected Discharge Date: 04/14/19                                     Social Determinants of Health (SDOH) Interventions    Readmission Risk Interventions No flowsheet data found.

## 2019-04-14 NOTE — ED Provider Notes (Addendum)
[facility] EMERGENCY DEPARTMENT Provider Note   CSN: [id number] Arrival date & time: 04/14/19  0148     History Chief Complaint  Patient presents with  . Chest Pain    KELDON AIREY is a 86 y.o. male.  The history is provided by the patient.  Chest Pain Pain location:  Substernal area Pain quality: pressure   Pain radiates to:  Does not radiate Pain severity:  Severe Onset quality:  Sudden Timing:  Constant Progression:  Unchanged Chronicity:  New Context: at rest   Context: not breathing   Relieved by:  Nothing Worsened by:  Nothing Ineffective treatments:  None tried Associated symptoms: no anxiety, no back pain, no cough, no diaphoresis, no dizziness, no fever, no palpitations, no shortness of breath and no vomiting   Risk factors: coronary artery disease and male sex   Patient began having 8/10 chest pressure at home at rest today.  States this is just like his previous MI.  No SOB.  No n/v/d.  Did not have palpitations and was unaware he was in afib.       Past Medical History:  Diagnosis Date  . Abdominal aortic aneurysm ([place])    being monitored-"checked in Oct. and it stayed the same" - 4.22 x 4.22 cm.  (also on Echo - TAA - 3.7 cm)  . Arthritis   . Atrial fibrillation ([place]) 02/13/2013   Peri-infarction  . CAD S/P percutaneous coronary angioplasty 02/13/2013   PROXIMAL-MID LAD 95-99% AND 80% TANDEM LESIONS - PROMUS PREMIER DES 2.75 MM X 24 MM (2.9 MM)  . LV dysfunction, EF by echo 02/11/13 45-50% - 50-55% as of 06/2014 02/13/2013   Echo Nalina Yeatman 2016: EF 50-55%.  No RWMA.  Gr 1 DD.  ?Thoracid Aortic dilation ~ 3.7.   . Macular degeneration of left eye   . Prostate disease   . 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 ST elevation myocardial infarction (STEMI) of anterior wall, initial episode of care ([place]) 02/10/2013  . Tremors of nervous system    both hands-under control    Patient Active Problem List   Diagnosis Date Noted  . Chest  pain 04/14/2019  . Encounter for long-term (current) use of medications 06/10/2014  . Preoperative cardiovascular examination 12/06/2013  . H/O atrial fibrillation without current medication - peri-infarct with no recurrence 02/13/2013  . LV dysfunction, EF by echo 02/11/13 45-50% 02/13/2013  . STEMI of anterior wall - 02/10/13 02/10/2013    Class: Hospitalized for  . Borderline hypertension 02/10/2013  . Hyperlipidemia with target LDL less than 70 02/10/2013  . AAA (abdominal aortic aneurysm)- 4.3 cm by [place] 11/14 02/10/2013  . CAD S/P Prox-Mid LAD; Promus DES 02/10/13 02/10/2013  . Ventricular fibrillation - occurred en route [facility]; CPR & Defib by EMS 02/10/2013    Class: Acute    Past Surgical History:  Procedure Laterality Date  . APPENDECTOMY  age 68   . CYSTOSCOPY W/ RETROGRADES Bilateral 02/03/2014   Procedure:  BILATERAL RETROGRADE PYELOGRAM;  Surgeon: Festus Aloe, MD;  Location: [facility] ORS;  Service: Urology;  Laterality: Bilateral;  . GREEN LIGHT LASER TURP (TRANSURETHRAL RESECTION OF PROSTATE N/A 02/03/2014   Procedure: GREEN LIGHT LASER TURP (TRANSURETHRAL RESECTION OF PROSTATE PHOTO VAPORIZATION;  Surgeon: Festus Aloe, MD;  Location: [facility] ORS;  Service: Urology;  Laterality: N/A;  . HERNIA REPAIR Left 2000  . LEFT HEART CATHETERIZATION WITH CORONARY ANGIOGRAM N/A 02/10/2013   Procedure: LEFT HEART CATHETERIZATION  WITH CORONARY ANGIOGRAM;  Surgeon: Leonie Man, MD;  Location: [facility] CATH LAB;  Service: Cardiovascular;  Laterality: N/A;  . PERCUTANEOUS CORONARY STENT INTERVENTION (PCI-S)  02/10/13   PROXIMAL-MID LAD 95-99% AND 80% TANDEM LESIONS - PROMUS PREMIER DES 2.75 MM X 24 MM (2.9 MM)  . TRANSTHORACIC ECHOCARDIOGRAM  06/2014   EF 50-55%.  No RWMA.  Gr 1 DD.  ?Thoracid Aortic dilation ~ 3.7.        History reviewed. No pertinent family history.  Social History   Tobacco Use  . Smoking status: Former Smoker    Packs/day: 1.00    Years: 10.00    Pack years:  10.00    Types: Cigarettes    Quit date: 06/07/1983    Years since quitting: 35.8  . Smokeless tobacco: Never Used  Substance Use Topics  . Alcohol use: Yes    Alcohol/week: 0.0 standard drinks    Comment: social  . Drug use: No    Home Medications Prior to Admission medications   Medication Sig Start Date End Date Taking? Authorizing Provider  alfuzosin (UROXATRAL) 10 MG 24 hr tablet Take 10 mg by mouth daily. 06/02/14  Yes [provider]  atorvastatin (LIPITOR) 20 MG tablet Take 20 mg by mouth daily at 6 PM.  09/19/15  Yes [provider]  clopidogrel (PLAVIX) 75 MG tablet Take 1 tablet (75 mg total) by mouth daily. 10/20/17  Yes Leonie Man, MD  Coenzyme Q10 (Q-10 CO-ENZYME PO) Take 100 mg by mouth daily.    Yes [provider]  dutasteride (AVODART) 0.5 MG capsule Take 0.5 mg by mouth daily.  09/19/15  Yes [provider]  fluticasone (FLONASE) 50 MCG/ACT nasal spray Place 2 sprays into both nostrils daily as needed for allergies.  01/06/18  Yes [provider]  levocetirizine (XYZAL) 5 MG tablet Take 5 mg by mouth every evening.  01/20/18  Yes [provider]  Multiple Vitamins-Minerals (ICAPS AREDS 2 PO) Take 1 tablet by mouth daily.    Yes [provider]  nitroGLYCERIN (NITROSTAT) 0.4 MG SL tablet PLACE 1 TABLET UNDER THE TONGUE EVERY 5 MINUTES AS NEEDED FOR CHEST PAIN Patient taking differently: Place 0.4 mg under the tongue every 5 (five) minutes as needed for chest pain.  11/26/17  Yes Leonie Man, MD  primidone (MYSOLINE) 50 MG tablet Take 50 mg by mouth 2 (two) times daily.    Yes [provider]  vitamin C (ASCORBIC ACID) 500 MG tablet Take 500 mg by mouth daily.   Yes [provider]  VITAMIN D PO Take 2,000 mg by mouth daily.   Yes [provider]    Allergies    Ciprofloxacin  Review of Systems   Review of Systems  Constitutional: Negative for diaphoresis and fever.    HENT: Negative for congestion.   Eyes: Negative for visual disturbance.  Respiratory: Negative for cough and shortness of breath.   Cardiovascular: Positive for chest pain. Negative for palpitations and leg swelling.  Gastrointestinal: Negative for vomiting.  Genitourinary: Negative.  Negative for difficulty urinating.  Musculoskeletal: Negative for arthralgias and back pain.  Neurological: Negative for dizziness.  Psychiatric/Behavioral: Negative for agitation.    Physical Exam Updated Vital Signs BP (!) 90/58   Pulse 92   Temp 98 F (36.7 C) (Oral)   Resp 17   Ht 5\' 10"  (1.778 m)   Wt 82.6 kg   SpO2 92%   BMI 26.11 kg/m   Physical Exam  Vitals and nursing note reviewed.  Constitutional:      Appearance: Normal appearance. He is not diaphoretic.  HENT:     Head: Normocephalic and atraumatic.     Nose: Nose normal.  Eyes:     Conjunctiva/sclera: Conjunctivae normal.     Pupils: Pupils are equal, round, and reactive to light.  Cardiovascular:     Rate and Rhythm: Normal rate. Rhythm irregular.     Pulses: Normal pulses.     Heart sounds: Normal heart sounds.  Pulmonary:     Effort: Pulmonary effort is normal.     Breath sounds: Normal breath sounds.  Abdominal:     General: Abdomen is flat. Bowel sounds are normal.     Tenderness: There is no abdominal tenderness. There is no guarding.  Musculoskeletal:        General: Normal range of motion.     Cervical back: Normal range of motion and neck supple.  Skin:    General: Skin is warm and dry.     Capillary Refill: Capillary refill takes less than 2 seconds.  Neurological:     General: No focal deficit present.     Mental Status: He is alert and oriented to person, place, and time.     Deep Tendon Reflexes: Reflexes normal.  Psychiatric:        Mood and Affect: Mood normal.        Behavior: Behavior normal.     ED Results / Procedures / Treatments   Labs (all labs ordered are listed, but only abnormal  results are displayed) Results for orders placed or performed during the hospital encounter of [id number]  Basic metabolic panel  Result Value Ref Range   Sodium 138 135 - 145 mmol/L   Potassium 3.9 3.5 - 5.1 mmol/L   Chloride 105 98 - 111 mmol/L   CO2 23 22 - 32 mmol/L   Glucose, Bld 141 (H) 70 - 99 mg/dL   BUN 20 8 - 23 mg/dL   Creatinine, Ser 1.23 0.61 - 1.24 mg/dL   Calcium 9.1 8.9 - 10.3 mg/dL   GFR calc non Af Amer 53 (L) >60 mL/min   GFR calc Af Amer >60 >60 mL/min   Anion gap 10 5 - 15  CBC  Result Value Ref Range   WBC 10.1 4.0 - 10.5 K/uL   RBC 4.58 4.22 - 5.81 MIL/uL   Hemoglobin 14.1 13.0 - 17.0 g/dL   HCT 43.3 39.0 - 52.0 %   MCV 94.5 80.0 - 100.0 fL   MCH 30.8 26.0 - 34.0 pg   MCHC 32.6 30.0 - 36.0 g/dL   RDW 13.9 11.5 - 15.5 %   Platelets 155 150 - 400 K/uL   nRBC 0.0 0.0 - 0.2 %  Troponin I (High Sensitivity)  Result Value Ref Range   Troponin I (High Sensitivity) 8 <18 ng/L   DG Chest 2 View  Result Date: 04/14/2019 CLINICAL DATA:  Chest pain EXAM: CHEST - 2 VIEW COMPARISON:  None. FINDINGS: The heart size and mediastinal contours are within normal limits. Bibasilar atelectasis. Both lungs are otherwise clear. The visualized skeletal structures are unremarkable. IMPRESSION: Bibasilar atelectasis. Electronically Signed   By: Ulyses Jarred M.D.   On: 04/14/2019 02:28   CT Angio Chest PE W and/or Wo Contrast  Result Date: 04/14/2019 CLINICAL DATA:  Chest pain. EXAM: CT ANGIOGRAPHY CHEST WITH CONTRAST TECHNIQUE: Multidetector CT imaging of the chest was performed using the standard protocol during bolus administration of intravenous  contrast. Multiplanar CT image reconstructions and MIPs were obtained to evaluate the vascular anatomy. CONTRAST:  27mL OMNIPAQUE IOHEXOL 350 MG/ML SOLN COMPARISON:  Radiograph earlier this day. FINDINGS: Cardiovascular: There are no filling defects within the pulmonary arteries to suggest pulmonary embolus. Aortic atherosclerosis. No  aortic aneurysm. Limited assessment for dissection given phase of contrast tailored to pulmonary artery evaluation. Coronary artery calcifications. Heart is normal in size. Minimal pericardial fluid may be physiologic. Mediastinum/Nodes: Shotty mediastinal lymph nodes, largest right lower paratracheal measures mm short axis. Prominent right hilar node measures 12 mm. Multiple small left hilar nodes, all subcentimeter. No thyroid nodule. No esophageal wall thickening, upper esophagus slightly patulous. Lungs/Pleura: Mild emphysema. Moderate central bronchial thickening. Hypoventilatory atelectasis, with additional linear atelectasis in the lingula and right lower lobe. No evidence of pulmonary edema. No pulmonary mass. No pleural fluid. Upper Abdomen: No acute findings. Few calcified lymph nodes in the porta hepatis likely sequela of prior granulomatous disease. Musculoskeletal: There are no acute or suspicious osseous abnormalities. Degenerative change in the spine. Review of the MIP images confirms the above findings. IMPRESSION: 1. No pulmonary embolus. 2. Mild emphysema with moderate bronchial thickening, most prominent in the lower lobes. Mild scattered atelectasis. 3. Shotty mediastinal and hilar lymph nodes, likely reactive. 4. Aortic atherosclerosis.  Coronary artery calcifications. Aortic Atherosclerosis (ICD10-I70.0) and Emphysema (ICD10-J43.9). Electronically Signed   By: Keith Rake M.D.   On: 04/14/2019 04:01    EKG EKG Interpretation  Date/Time:  Thursday April 14 2019 02:00:01 EST Ventricular Rate:  98 PR Interval:    QRS Duration: 74 QT Interval:  350 QTC Calculation: 426 R Axis:   50 Text Interpretation: Atrial fibrillation Abnormal R-wave progression, early transition Borderline T wave abnormalities Confirmed by Dory Horn) on 04/14/2019 2:59:25 AM   Radiology DG Chest 2 View  Result Date: 04/14/2019 CLINICAL DATA:  Chest pain EXAM: CHEST - 2 VIEW COMPARISON:   None. FINDINGS: The heart size and mediastinal contours are within normal limits. Bibasilar atelectasis. Both lungs are otherwise clear. The visualized skeletal structures are unremarkable. IMPRESSION: Bibasilar atelectasis. Electronically Signed   By: Ulyses Jarred M.D.   On: 04/14/2019 02:28   CT Angio Chest PE W and/or Wo Contrast  Result Date: 04/14/2019 CLINICAL DATA:  Chest pain. EXAM: CT ANGIOGRAPHY CHEST WITH CONTRAST TECHNIQUE: Multidetector CT imaging of the chest was performed using the standard protocol during bolus administration of intravenous contrast. Multiplanar CT image reconstructions and MIPs were obtained to evaluate the vascular anatomy. CONTRAST:  40mL OMNIPAQUE IOHEXOL 350 MG/ML SOLN COMPARISON:  Radiograph earlier this day. FINDINGS: Cardiovascular: There are no filling defects within the pulmonary arteries to suggest pulmonary embolus. Aortic atherosclerosis. No aortic aneurysm. Limited assessment for dissection given phase of contrast tailored to pulmonary artery evaluation. Coronary artery calcifications. Heart is normal in size. Minimal pericardial fluid may be physiologic. Mediastinum/Nodes: Shotty mediastinal lymph nodes, largest right lower paratracheal measures mm short axis. Prominent right hilar node measures 12 mm. Multiple small left hilar nodes, all subcentimeter. No thyroid nodule. No esophageal wall thickening, upper esophagus slightly patulous. Lungs/Pleura: Mild emphysema. Moderate central bronchial thickening. Hypoventilatory atelectasis, with additional linear atelectasis in the lingula and right lower lobe. No evidence of pulmonary edema. No pulmonary mass. No pleural fluid. Upper Abdomen: No acute findings. Few calcified lymph nodes in the porta hepatis likely sequela of prior granulomatous disease. Musculoskeletal: There are no acute or suspicious osseous abnormalities. Degenerative change in the spine. Review of the MIP images confirms the above findings.  IMPRESSION: 1. No pulmonary embolus. 2. Mild emphysema with moderate bronchial thickening, most prominent in the lower lobes. Mild scattered atelectasis. 3. Shotty mediastinal and hilar lymph nodes, likely reactive. 4. Aortic atherosclerosis.  Coronary artery calcifications. Aortic Atherosclerosis (ICD10-I70.0) and Emphysema (ICD10-J43.9). Electronically Signed   By: Keith Rake M.D.   On: 04/14/2019 04:01    Procedures Procedures (including critical care time)  Medications Ordered in ED Medications  fentaNYL (SUBLIMAZE) injection 50 mcg (50 mcg Intravenous Not Given 04/14/19 0509)  heparin ADULT infusion 100 units/mL (25000 units/243mL sodium chloride 0.45%) (1,200 Units/hr Intravenous New Bag/Given 04/14/19 0507)  0.9 %  sodium chloride infusion (has no administration in time range)  aspirin chewable tablet 324 mg (324 mg Oral Given 04/14/19 0316)  fentaNYL (SUBLIMAZE) injection 50 mcg (50 mcg Intravenous Given 04/14/19 0316)  ondansetron (ZOFRAN) injection 4 mg (4 mg Intravenous Given 04/14/19 0316)  iohexol (OMNIPAQUE) 350 MG/ML injection 80 mL (80 mLs Intravenous Contrast Given 04/14/19 0333)  heparin bolus via infusion 4,000 Units (4,000 Units Intravenous Bolus from Bag 04/14/19 0509)    MDM Reviewed: nursing note and vitals Interpretation: labs, x-ray and ECG (NACPD) Total time providing critical care: heparin drip initiated  Consults: admitting MD and cardiology  CRITICAL CARE Performed by: Jazsmin Couse K Christan Defranco-Rasch Total critical care time: 60 minutes Critical care time was exclusive of separately billable procedures and treating other patients. Critical care was necessary to treat or prevent imminent or life-threatening deterioration. Critical care was time spent personally by me on the following activities: development of treatment plan with patient and/or surrogate as well as nursing, discussions with consultants, evaluation of patient's response to treatment, examination of  patient, obtaining history from patient or surrogate, ordering and performing treatments and interventions, ordering and review of laboratory studies, ordering and review of radiographic studies, pulse oximetry and re-evaluation of patient's condition.  ED Course  I have reviewed the triage vital signs and the nursing notes.  Pertinent labs & imaging results that were available during my care of the patient were reviewed by me and considered in my medical decision making (see chart for details).    Case d/w Cardiology fellow start heparin as troponin is not positive may admit to medicine based on heart score.    Refused 2nd dose of pain medication as pain free.   Final Clinical Impression(s) / ED Diagnoses Final diagnoses:  Chest pain, unspecified type  Atrial fibrillation, unspecified type ([place])    Afib, unclear when this started.  But it is rate controlled.     Lorissa Kishbaugh, MD 04/14/19 [place], Doria Fern, MD 04/14/19 [id number]

## 2019-04-14 NOTE — Progress Notes (Signed)
ANTICOAGULATION CONSULT NOTE - Initial Consult  Pharmacy Consult for heparin Indication: atrial fibrillation  Allergies  Allergen Reactions  . Ciprofloxacin Diarrhea and Nausea Only    Patient Measurements: Height: 5\' 10"  (177.8 cm) Weight: 182 lb (82.6 kg) IBW/kg (Calculated) : 73  Vital Signs: Temp: 98 F (36.7 C) (01/21 0200) Temp Source: Oral (01/21 0200) BP: 98/75 (01/21 0230) Pulse Rate: 94 (01/21 0200)  Labs: Recent Labs    04/14/19 0209  HGB 14.1  HCT 43.3  PLT 155  CREATININE 1.23  TROPONINIHS 8    Estimated Creatinine Clearance: 45.3 mL/min (by C-G formula based on SCr of 1.23 mg/dL).   Medical History: Past Medical History:  Diagnosis Date  . Abdominal aortic aneurysm ([place])    being monitored-"checked in Oct. and it stayed the same" - 4.22 x 4.22 cm.  (also on Echo - TAA - 3.7 cm)  . Arthritis   . Atrial fibrillation ([place]) 02/13/2013   Peri-infarction  . CAD S/P percutaneous coronary angioplasty 02/13/2013   PROXIMAL-MID LAD 95-99% AND 80% TANDEM LESIONS - PROMUS PREMIER DES 2.75 MM X 24 MM (2.9 MM)  . LV dysfunction, EF by echo 02/11/13 45-50% - 50-55% as of 06/2014 02/13/2013   Echo April 2016: EF 50-55%.  No RWMA.  Gr 1 DD.  ?Thoracid Aortic dilation ~ 3.7.   . Macular degeneration of left eye   . Prostate disease   . 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 ST elevation myocardial infarction (STEMI) of anterior wall, initial episode of care ([place]) 02/10/2013  . Tremors of nervous system    both hands-under control     Assessment: 86yo male c/o CP since 4p, found to be in Afib, no known h/o Afib other than peri-infarct with no known recurrence, to begin heparin.  Goal of Therapy:  Heparin level 0.3-0.7 units/ml Monitor platelets by anticoagulation protocol: Yes   Plan:  Will give heparin 4000 units IV bolus x1 followed by gtt at 1200 units/hr and monitor heparin levels and CBC.  Wynona Neat, PharmD, BCPS  04/14/2019,3:53 AM

## 2019-04-14 NOTE — ED Triage Notes (Signed)
Pt bib ems, states the pt was having chest pain starting around 4pm today. 8/10 pain. Pt also currently in A-fib.  No history of afib. 112/63 130-90 hr 97% ra
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
## 2019-04-15 ENCOUNTER — Other Ambulatory Visit: Payer: Self-pay

## 2019-04-15 ENCOUNTER — Telehealth (HOSPITAL_COMMUNITY): Payer: Self-pay

## 2019-04-15 DIAGNOSIS — I714 Abdominal aortic aneurysm, without rupture, unspecified: Secondary | ICD-10-CM

## 2019-04-15 NOTE — Telephone Encounter (Signed)

## 2019-04-18 ENCOUNTER — Ambulatory Visit (INDEPENDENT_AMBULATORY_CARE_PROVIDER_SITE_OTHER): Payer: PPO | Admitting: Physician Assistant

## 2019-04-18 ENCOUNTER — Other Ambulatory Visit: Payer: Self-pay

## 2019-04-18 ENCOUNTER — Ambulatory Visit (HOSPITAL_COMMUNITY)
Admission: RE | Admit: 2019-04-18 | Discharge: 2019-04-18 | Disposition: A | Discharge status: Home / Self Care | Payer: PPO | Source: Ambulatory Visit | Attending: Surgery | Admitting: Surgery

## 2019-04-18 VITALS — BP 112/61 | HR 102 | Temp 97.4°F | Resp 16 | Ht 70.0 in | Wt 181.0 lb

## 2019-04-18 DIAGNOSIS — I714 Abdominal aortic aneurysm, without rupture, unspecified: Secondary | ICD-10-CM
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
## 2019-04-21 DIAGNOSIS — I10 Essential (primary) hypertension: Secondary | ICD-10-CM | POA: Diagnosis not present

## 2019-04-21 DIAGNOSIS — E7800 Pure hypercholesterolemia, unspecified: Secondary | ICD-10-CM | POA: Diagnosis not present

## 2019-04-21 DIAGNOSIS — N400 Enlarged prostate without lower urinary tract symptoms: Secondary | ICD-10-CM | POA: Diagnosis not present

## 2019-04-29 ENCOUNTER — Telehealth: Payer: Self-pay | Admitting: Cardiology

## 2019-04-29 NOTE — Telephone Encounter (Signed)
Please advise 

## 2019-04-29 NOTE — Telephone Encounter (Signed)
Patient can try docusate 100mg  daily and increase hydration to alleviate constipation.

## 2019-04-29 NOTE — Telephone Encounter (Signed)
Pt c/o medication issue:  1. Name of Medication: apixaban (ELIQUIS) 5 MG TABS tablet  2. How are you currently taking this medication (dosage and times per day)? Twice a day   3. Are you having a reaction (difficulty breathing--STAT)? no  4. What is your medication issue? Fatigue and constipation for 4 days  Patient would like any recommendations on what to take to help the constipation.

## 2019-04-29 NOTE — Telephone Encounter (Signed)
Called patient, LVM advising of message from PharmD. Left call back number if questions or concerns.

## 2019-05-04 ENCOUNTER — Encounter: Payer: Self-pay | Admitting: Cardiology

## 2019-05-04 ENCOUNTER — Ambulatory Visit: Payer: PPO | Admitting: Cardiology

## 2019-05-04 ENCOUNTER — Other Ambulatory Visit: Payer: Self-pay

## 2019-05-04 VITALS — BP 112/68 | HR 120 | Ht 70.0 in | Wt 188.0 lb

## 2019-05-04 DIAGNOSIS — I25119 Atherosclerotic heart disease of native coronary artery with unspecified angina pectoris: Secondary | ICD-10-CM

## 2019-05-04 DIAGNOSIS — I4819 Other persistent atrial fibrillation: Secondary | ICD-10-CM | POA: Diagnosis not present

## 2019-05-04 DIAGNOSIS — I2510 Atherosclerotic heart disease of native coronary artery without angina pectoris: Secondary | ICD-10-CM | POA: Diagnosis not present

## 2019-05-04 DIAGNOSIS — Z9861 Coronary angioplasty status: Secondary | ICD-10-CM

## 2019-05-04 DIAGNOSIS — I714 Abdominal aortic aneurysm, without rupture, unspecified: Secondary | ICD-10-CM

## 2019-05-04 DIAGNOSIS — I208 Other forms of angina pectoris: Secondary | ICD-10-CM | POA: Diagnosis not present

## 2019-05-04 DIAGNOSIS — Z79899 Other long term (current) drug therapy: Secondary | ICD-10-CM

## 2019-05-04 DIAGNOSIS — I2109 ST elevation (STEMI) myocardial infarction involving other coronary artery of anterior wall: Secondary | ICD-10-CM

## 2019-05-04 DIAGNOSIS — E785 Hyperlipidemia, unspecified: Secondary | ICD-10-CM | POA: Diagnosis not present

## 2019-05-04 MED ORDER — DILTIAZEM HCL 60 MG PO TABS: 60.0000 mg | ORAL_TABLET | Freq: Three times a day (TID) | ORAL | 6 refills | Status: DC
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
## 2019-05-04 NOTE — Patient Instructions (Signed)
Medication Instructions:  Continue with current medications  Start taking diltiazem ( Cardizem) 60 mg   Take one tablet about every 8 hours . The first dose take 2 tablet  . *If you need a refill on your cardiac medications before your next appointment, please call your pharmacy*  Lab Work:   Testing/Procedures: Not needed  Follow-Up: At [facility], you and your health needs are our priority.  As part of our continuing mission to provide you with exceptional heart care, we have created designated Provider Care Teams.  These Care Teams include your primary Cardiologist (physician) and Advanced Practice Providers (APPs -  Physician Assistants and Nurse Practitioners) who all work together to provide you with the care you need, when you need it.  Your next appointment:   2 week(s) week of FEB of 22  The format for your next appointment:   In Person  Provider:   Jory Sims, DNP, ANP or afib clinid  Other Instructions n/a

## 2019-05-05 ENCOUNTER — Other Ambulatory Visit: Payer: Self-pay | Admitting: Cardiology

## 2019-05-05 NOTE — Telephone Encounter (Signed)
*  STAT* If patient is at the pharmacy, call can be transferred to refill team.   1. Which medications need to be refilled? (please list name of each medication and dose if known) apixaban (ELIQUIS) 5 MG TABS tablet  2. Which pharmacy/location (including street and city if local pharmacy) is medication to be sent to? [facility] - [place], [place] - [address]  3. Do they need a 30 day or 90 day supply? 90  

## 2019-05-06 ENCOUNTER — Encounter: Payer: Self-pay | Admitting: Cardiology

## 2019-05-06 DIAGNOSIS — E7800 Pure hypercholesterolemia, unspecified: Secondary | ICD-10-CM | POA: Diagnosis not present

## 2019-05-06 DIAGNOSIS — I10 Essential (primary) hypertension: Secondary | ICD-10-CM | POA: Diagnosis not present

## 2019-05-06 DIAGNOSIS — N400 Enlarged prostate without lower urinary tract symptoms: Secondary | ICD-10-CM | POA: Diagnosis not present

## 2019-05-06 MED ORDER — APIXABAN 5 MG PO TABS: 5.0000 mg | ORAL_TABLET | Freq: Two times a day (BID) | ORAL | 1 refills | Status: DC

## 2019-05-06 NOTE — Assessment & Plan Note (Signed)
Currently now in persistent A. fib which is the first recurrence as far as I can tell since back time of his MI. He has had a complete evaluation in the hospital.  He was started on apixaban on January 22.  Unfortunately, he was not placed on any type of rate control agent.  He does have a history of hypotension and has had some bradycardia, therefore I do want to be somewhat judicious.  Plan: We will start diltiazem 60 mg 3 times daily with the intention of rate control.  Once we can see what his blood pressure and heart rate does in response to diltiazem, we can potentially consolidate to once daily dosing. I would also like to set him up for 2-week follow-up at which time we can either consolidate his calcium channel blocker if rate is well controlled and he is asymptomatic versus sending out for cardioversion.  No bleeding on apixaban.

## 2019-05-06 NOTE — Assessment & Plan Note (Addendum)
Single-vessel disease noted on cath 6+ years ago with LAD PCI. Has not had active angina but is now having some chest discomfort while in A. fib. Did have ischemic evaluation and echo as part of his A. fib evaluation which did not show any evidence of ischemia.  Plan: He has never really had very much blood pressure, therefore we have not been treating with ACE inhibitor/ARB and/or beta-blocker. With a normal EF, I think we would be okay starting low-dose calcium channel blocker for rate control. He is on a stable dose of statin. Not on aspirin or Plavix because he is now on apixaban.

## 2019-05-06 NOTE — Assessment & Plan Note (Signed)
Suspicion of potential anginal chest pain in the setting of A. fib, but stress test showed no evidence of ischemia.  He could simply have microvascular disease related angina from A. fib RVR and demand ischemia, but not likely something that can be treated with intervention.

## 2019-05-06 NOTE — Assessment & Plan Note (Signed)
Excellent lipids from June of last year.  He is on a stable dose of rosuvastatin tolerating well.   As well as his labs have been controlled, I think is probably okay for every year check.  Continue to refill rosuvastatin.

## 2019-05-06 NOTE — Assessment & Plan Note (Signed)
Initial presentation was with a pretty significant anterior MI with reduced EF as well as A. fib.  Thankfully the wall motion improved back to almost baseline normal at 50 to 55%. Recent Myoview does suggest only residual mild apical infarct.  Thankfully, no active anginal heart failure symptoms and no evidence of ischemia.

## 2019-05-06 NOTE — Assessment & Plan Note (Signed)
Tandem LAD lesion treated with a 2.75 mm x 24 mm DES stent.  Recently converted from aspirin Plavix to apixaban with recurrence of A. Fib.

## 2019-05-06 NOTE — Telephone Encounter (Signed)
82 M, 85.3 kg, SCr 1.23 LOV Ellyn Hack 2/21

## 2019-05-06 NOTE — Assessment & Plan Note (Signed)
There has been some progression of disease from 6 months ago.  Is being followed by vascular surgery.

## 2019-05-06 NOTE — Telephone Encounter (Signed)
Please review for refill. Thank you! 

## 2019-05-09 ENCOUNTER — Telehealth: Payer: Self-pay | Admitting: Cardiology

## 2019-05-09 NOTE — Telephone Encounter (Signed)
Pt c/o swelling: STAT is pt has developed SOB within 24 hours  1) How much weight have you gained and in what time span? Has not gained any  2) If swelling, where is the swelling located? Both feet and ankles  3) Are you currently taking a fluid pill? no  4) Are you currently SOB? No but does get short of breath when laying down at night or when walking long distances. States he cannot sleep at night. Started on Friday 05/06/19  5) Do you have a log of your daily weights (if so, list)? no  6) Have you gained 3 pounds in a day or 5 pounds in a week? no  7) Have you traveled recently? no

## 2019-05-09 NOTE — Telephone Encounter (Signed)
Spoke with patient and he has swelling in his ankles and feet. He is also having shortness of breath with exertion and lying down. Per patient this is all new since last visit. Scheduled appointment for tomorrow with Dr Ellyn Hack. Patient aware of date and time

## 2019-05-10 ENCOUNTER — Ambulatory Visit: Payer: PPO | Admitting: Cardiology

## 2019-05-10 ENCOUNTER — Other Ambulatory Visit: Payer: Self-pay

## 2019-05-10 ENCOUNTER — Encounter: Payer: Self-pay | Admitting: Cardiology

## 2019-05-10 VITALS — BP 129/71 | HR 88 | Ht 70.0 in | Wt 190.8 lb

## 2019-05-10 DIAGNOSIS — Z9861 Coronary angioplasty status: Secondary | ICD-10-CM | POA: Diagnosis not present

## 2019-05-10 DIAGNOSIS — I25119 Atherosclerotic heart disease of native coronary artery with unspecified angina pectoris: Secondary | ICD-10-CM | POA: Diagnosis not present

## 2019-05-10 DIAGNOSIS — I714 Abdominal aortic aneurysm, without rupture, unspecified: Secondary | ICD-10-CM

## 2019-05-10 DIAGNOSIS — E785 Hyperlipidemia, unspecified: Secondary | ICD-10-CM | POA: Diagnosis not present

## 2019-05-10 DIAGNOSIS — I4819 Other persistent atrial fibrillation: Secondary | ICD-10-CM | POA: Diagnosis not present

## 2019-05-10 DIAGNOSIS — I5033 Acute on chronic diastolic (congestive) heart failure: Secondary | ICD-10-CM

## 2019-05-10 DIAGNOSIS — I2510 Atherosclerotic heart disease of native coronary artery without angina pectoris: Secondary | ICD-10-CM | POA: Diagnosis not present

## 2019-05-10 MED ORDER — POTASSIUM CHLORIDE CRYS ER 20 MEQ PO TBCR: EXTENDED_RELEASE_TABLET | ORAL | 6 refills | Status: DC

## 2019-05-10 MED ORDER — FUROSEMIDE 40 MG PO TABS: 40.0000 mg | ORAL_TABLET | Freq: Two times a day (BID) | ORAL | 6 refills | Status: DC

## 2019-05-10 NOTE — Patient Instructions (Addendum)
Medication Instructions:   START TAKING  LASIX  40 MG tablet  PLUS POTASSIUM 20 MEQ tablet   TAKE FIRST DOSE TONIGHT  TOMORROW   40 MG  TWICE A DAY   THEN ON 2/ 18/21  UNTIL  05/16/19  One tablet every day WHEN YOU SEE LUKE PA  *If you need a refill on your cardiac medications before your next appointment, please call your pharmacy*  Lab Work: Bmp on the feb 21,2021 If you have labs (blood work) drawn today and your tests are completely normal, you will receive your results only by: Marland Kitchen MyChart Message (if you have MyChart) OR . A paper copy in the mail If you have any lab test that is abnormal or we need to change your treatment, we will call you to review the results.  Testing/Procedures: Not needed  Follow-Up: At [facility], you and your health needs are our priority.  As part of our continuing mission to provide you with exceptional heart care, we have created designated Provider Care Teams.  These Care Teams include your primary Cardiologist (physician) and Advanced Practice Providers (APPs -  Physician Assistants and Nurse Practitioners) who all work together to provide you with the care you need, when you need it.  Your next appointment:    [place]   The format for your next appointment:   In Person  Provider:   Kerin Ransom, PA-C  Other Instructions N/A

## 2019-05-10 NOTE — Assessment & Plan Note (Signed)
The plan is for him to actually see Dr. Sherren Mocha Early for his next vascular surgical follow-up.  May require EVAR.  We will need to get his A. fib and heart failure situation resolved prior to this.

## 2019-05-10 NOTE — Progress Notes (Signed)
Primary Care Provider: Lajean Manes, MD Cardiologist: Glenetta Hew, MD Electrophysiologist: None  Clinic Note: Chief Complaint  Patient presents with  . Shortness of Breath    Mostly with exertion with some orthopnea  . Edema  . Atrial Fibrillation    Says rate has been better controlled  . Coronary Artery Disease    No angina    HPI:    [name] is a 86 y.o. male with a PMH notable for CAD, AAA (see below) & new recurrence of Persistent Atrial Fibrillation who presents today for scheduled 2-week follow-up with progressive worsening edema and exertional dyspnea.   History of anteriorSTEMIin Hopkins 2014 - with VT: PROXIMAL-MID LAD 95-99% AND 80% TANDEM LESIONS -- PCI w/ PROMUS PREMIER DES 2.75 MM X 24 MM (2.9 MM)   STEMI complicated by Afib - no further recurrence   Initial mild ICM - resolved with EF 50-55% (06/2014)  He had ventricular tachycardia as well as atrial fibrillation peri-infarct, but no residual episodes.  AAA - followed by [name]  Has been intolerant of beta-blocker because of fatigue and hypotension, also no ARB because of hypotension.  Recurrent ATRIAL FIBRILLATION -initially found during hospitalization January 21 -> admitted for chest pain, found to have A. fib (echocardiogram and Myoview ordered, both relatively normal).  Started on apixaban but no rate control.  [name] was last seen   Recent Hospitalizations:   N/A  Reviewed  CV studies:    The following studies were reviewed today: (if available, images/films reviewed: From Epic Chart or Care Everywhere) . n/a   Interval History:   [name] presents here today noting that that despite his heart rates being better controlled with the diltiazem, overall symptomatically he is taken a turn for the worse.  He is now noticing pretty significant swelling up to the knees bilaterally in both legs but is also noticing significantly worsening exertional dyspnea, orthopnea and PND  type symptoms.  No chest pain or pressure.  No syncope or near syncope.  No bleeding issues with the initiation of Eliquis.   actually not really feeling all that bad.  He does not really notice that he is having any irregular heartbeats or fast heart rate right now.  When he does notice a little little bit more fatigue and limited exertional dyspnea this been going on since he was in the hospital.  He says that when he feels his heartbeat irregularities really up a tightness of the upper chest across from the shoulder shoulder.  Other than this symptom that is not always happening, he denies any exertional chest pain.  No heart failure symptoms and no syncope or near syncope.  CV Review of Symptoms (Summary) Cardiovascular ROS: positive for - dyspnea on exertion, edema, irregular heartbeat, orthopnea, paroxysmal nocturnal dyspnea and Fatigue, exercise intolerance that is worse than it had been negative for - chest pain, rapid heart rate or Syncope/near syncope, TIA/amaurosis fugax.  Claudication  The patient does not have symptoms concerning for COVID-19 infection (fever, chills, cough, or new shortness of breath).  The patient is practicing social distancing & Masking.    REVIEWED OF SYSTEMS   A comprehensive ROS was performed. Review of Systems  Constitutional: Positive for malaise/fatigue. Negative for weight loss.  HENT: Negative for nosebleeds.   Respiratory: Positive for shortness of breath and wheezing (Wife notes that he is wheezing at night).   Cardiovascular: Positive for orthopnea (Having to sleep on more pillows), leg swelling and PND.  Gastrointestinal: Negative for blood in stool and melena.  Genitourinary: Negative for hematuria.  Musculoskeletal: Positive for joint pain. Negative for falls.  Neurological: Positive for weakness (Global). Negative for dizziness.  Psychiatric/Behavioral: Negative for memory loss. The patient is not nervous/anxious and does not have insomnia.     I have reviewed and (if needed) personally updated the patient's problem list, medications, allergies, past medical and surgical history, social and family history.   PAST MEDICAL HISTORY   Past Medical History:  Diagnosis Date  . Abdominal aortic aneurysm ([place]) 03/2019    Mid aortic large diameter 5.0 cm.  Has increased compared to 4.6- 4.8 on prior study  . Arthritis   . CAD S/P percutaneous coronary angioplasty 02/13/2013   PROXIMAL-MID LAD 95-99% AND 80% TANDEM LESIONS - PROMUS PREMIER DES 2.75 MM X 24 MM (2.9 MM)  . Macular degeneration of left eye   . Paroxysmal atrial fibrillation ([place]) 02/13/2013   Initially was peri-infarction at time of anterior MI; no recurrence from 2014 until January 2021.  Now persistent  . Prostate disease   . 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 ST elevation myocardial infarction (STEMI) of anterior wall, initial episode of care ([place]) 02/10/2013  . Tremors of nervous system    both hands-under control    PAST SURGICAL HISTORY   Past Surgical History:  Procedure Laterality Date  . AAA DUPLEX  03/2019   AAA DUPLEX June 2020: Largest Ao Ddiameter unchanged from Oct 2019 -> ~4.6 cm; January 21: Mid aortic large diameter 5.0 cm.  Has increased compared to 4.6- 4.8 on prior study.    . APPENDECTOMY  age 40   . CYSTOSCOPY W/ RETROGRADES Bilateral 02/03/2014   Procedure:  BILATERAL RETROGRADE PYELOGRAM;  Surgeon: Festus Aloe, MD;  Location: [facility];  Service: Urology;  Laterality: Bilateral;  . GREEN LIGHT LASER TURP (TRANSURETHRAL RESECTION OF PROSTATE N/A 02/03/2014   Procedure: GREEN LIGHT LASER TURP (TRANSURETHRAL RESECTION OF PROSTATE PHOTO VAPORIZATION;  Surgeon: Festus Aloe, MD;  Location: [facility];  Service: Urology;  Laterality: N/A;  . HERNIA REPAIR Left 2000  . LEFT HEART CATHETERIZATION WITH CORONARY ANGIOGRAM N/A 02/10/2013   Procedure: LEFT HEART CATHETERIZATION WITH CORONARY ANGIOGRAM;  Surgeon: Leonie Man, MD;  Location:  [facility] C[facility] LAB;  Service: Cardiovascular;  Laterality: N/A;  . NM MYOVIEW LTD  03/2019   Performed to evaluate A. fib: Baseline A. fib.  No ST segment changes.  EF estimated 49% with normal wall motion, but gating was limited because of A. fib.  Small size, mild severity mostly fixed apical defect suggestive of small prior infarct with mild peri-infarct ischemia.  LOW RISK.  No evidence of large or high risk ischemia present.  (Likely consistent with prior infarct)  . PERCUTANEOUS CORONARY STENT INTERVENTION (PCI-S)  02/10/13   PROXIMAL-MID LAD 95-99% AND 80% TANDEM LESIONS - PROMUS PREMIER DES 2.75 MM X 24 MM (2.9 MM)  . shot in left eye 03/2019 for macular degeneration    . TRANSTHORACIC ECHOCARDIOGRAM  03/2019   Normal LVEF 50 to 55%.  No R WMA.  Unable to assess diastolic function because of A. fib.  Mild LA dilation.  Mild aortic sclerosis without stenosis    MEDICATIONS/ALLERGIES   Current Meds  Medication Sig  . alfuzosin (UROXATRAL) 10 MG 24 hr tablet Take 10 mg by mouth daily.  Marland Kitchen apixaban (ELIQUIS) 5 MG TABS tablet Take 1 tablet (5 mg total) by mouth 2 (two) times daily.  Marland Kitchen  atorvastatin (LIPITOR) 20 MG tablet Take 20 mg by mouth daily at 6 PM.   . Coenzyme Q10 (Q-10 CO-ENZYME PO) Take 100 mg by mouth daily.   Marland Kitchen diltiazem (CARDIZEM) 60 MG tablet Take 1 tablet (60 mg total) by mouth 3 (three) times daily.  Marland Kitchen dutasteride (AVODART) 0.5 MG capsule Take 0.5 mg by mouth daily.   . fluticasone (FLONASE) 50 MCG/ACT nasal spray Place 2 sprays into both nostrils daily as needed for allergies.   Marland Kitchen levocetirizine (XYZAL) 5 MG tablet Take 5 mg by mouth every evening.   . Multiple Vitamins-Minerals (ICAPS AREDS 2 PO) Take 1 tablet by mouth daily.   . nitroGLYCERIN (NITROSTAT) 0.4 MG SL tablet PLACE 1 TABLET UNDER THE TONGUE EVERY 5 MINUTES AS NEEDED FOR CHEST PAIN (Patient taking differently: Place 0.4 mg under the tongue every 5 (five) minutes as needed for chest pain. )  . primidone (MYSOLINE) 50 MG  tablet Take 50 mg by mouth 2 (two) times daily.   . vitamin C (ASCORBIC ACID) 500 MG tablet Take 500 mg by mouth daily.    Allergies  Allergen Reactions  . Ciprofloxacin Diarrhea and Nausea Only    SOCIAL HISTORY/FAMILY HISTORY   Social History   Tobacco Use  . Smoking status: Former Smoker    Packs/day: 1.00    Years: 10.00    Pack years: 10.00    Types: Cigarettes    Quit date: 06/07/1983    Years since quitting: 35.9  . Smokeless tobacco: Never Used  Substance Use Topics  . Alcohol use: Yes    Alcohol/week: 0.0 standard drinks    Comment: social  . Drug use: No   Social History   Social History Narrative   He is married.     Home - lives with wife; NOK: Wife, Yaden Dralle, [phone]      Retired.   Quit smoking in 1985. Does not drink alcohol.    Family History family history is not on file.  OBJCTIVE -PE, EKG, labs   Wt Readings from Last 3 Encounters:  05/10/19 190 lb 12.8 oz (86.5 kg)  05/04/19 188 lb (85.3 kg)  04/18/19 181 lb (82.1 kg)    Physical Exam: BP 129/71   Pulse 88   Ht 5\' 10"  (1.778 m)   Wt 190 lb 12.8 oz (86.5 kg)   SpO2 97%   BMI 27.38 kg/m  Physical Exam  Constitutional: He is oriented to person, place, and time. He appears well-developed and well-nourished. No distress.  Healthy-appearing elderly gentleman.  HENT:  Head: Normocephalic and atraumatic.  Neck: Hepatojugular reflux and JVD (Less pulsatile with no cannon A waves, but roughly 8-9 cmH2O) present. Carotid bruit is not present (Likely radiated aortic murmur).  Cardiovascular: Normal rate, S1 normal, S2 normal and intact distal pulses. An irregular rhythm present. Frequent extrasystoles are present. PMI is not displaced. Exam reveals distant heart sounds and decreased pulses (Diminished pedal pulses secondary to edema). Exam reveals no gallop and no friction rub.  Murmur (1/6 SEM at RUSB --> carotids) heard. Pulmonary/Chest: Effort normal. No respiratory distress. He has  no wheezes. He has rales (Mild basal rales).  Abdominal: Soft. Bowel sounds are normal. He exhibits no distension. There is no abdominal tenderness. There is no rebound.  Musculoskeletal:        General: No edema. Normal range of motion.     Cervical back: Normal range of motion and neck supple.  Neurological: He is alert and oriented to person, place, and  time.  Skin: Skin is warm and dry. There is erythema (Erythematous lower legs bilaterally.  No warmth (rubor without calor)).  Psychiatric: He has a normal mood and affect. His behavior is normal. Judgment and thought content normal.  Vitals reviewed.   Adult ECG Report  Rate: 88;  Rhythm: normal sinus rhythm, sinus arrhythmia, premature atrial contractions (PAC) and Difficult to distinguish from A. fib, however clean obvious P waves noted.;   Narrative Interpretation: A. fib RVR  Recent Labs: June 2020-TC 106, TG 106, HDL 34, LDL 51.  A1c 5.7.  Lab Results  Component Value Date   CREATININE 1.23 04/14/2019   BUN 20 04/14/2019   NA 138 04/14/2019   K 3.9 04/14/2019   CL 105 04/14/2019   CO2 23 04/14/2019  Apixaban  Lab Results  Component Value Date   TSH 3.255 04/14/2019    ASSESSMENT/PLAN    Problem List Items Addressed This Visit    CAD S/P Prox-Mid LAD; Promus DES 02/10/13 (Chronic)    About 7 years out from tandem DES PCI to LAD -> no longer on aspirin and Plavix as he has been started on apixaban.      Relevant Medications   furosemide (LASIX) 40 MG tablet   Persistent atrial fibrillation (HCC) - Primary (Chronic)    His current EKG is difficult to read but it but does appear to clearly have P waves and so is probably more consistent with sinus rhythm, sinus arrhythmia with significant PACs.  Definitely has substrate for going back into A. fib, but is currently in sinus rhythm.  Despite this, he is still having the dyspnea symptoms consistent with heart failure from A. Fib.  He says his rate has been well controlled  with addition of diltiazem, but he is now having edema, PND and orthopnea.   This patients CHA2DS2-VASc Score and unadjusted Ischemic Stroke Rate (% per year) is equal to 3.2 % stroke rate/year from a score of 3 --> on apixaban.  Above score calculated as 1 point each if present [CHF, HTN, DM, Vascular=MI/PAD/Aortic Plaque, Age if 65-74, or Male] Above score calculated as 2 points each if present [Age > 75, or Stroke/TIA/TE]       Relevant Medications   furosemide (LASIX) 40 MG tablet   Other Relevant Orders   EKG XX123456   Basic metabolic panel   Coronary artery disease involving native coronary artery of native heart with angina pectoris (HCC) (Chronic)    History of single-vessel CAD with PCI to the LAD between 6 to 7 years ago.  For the most part he has been doing very very well until he had chest pain and dyspnea associated with A. Fib.  Nonischemic Myoview during last hospital stay as part of evaluation for A. fib.  Had not really been on many medications because of low blood pressures.  Was started on diltiazem at last visit for rate control which with normal EF is adequate for antianginal benefit and replacement of beta-blocker.  (We did not use a beta-blocker because of history of fatigue and AV nodal block)  He is on moderate dose atorvastatin with well-controlled lipids as of last check.      Relevant Medications   furosemide (LASIX) 40 MG tablet   Other Relevant Orders   EKG [id number]   Basic metabolic panel   Hyperlipidemia with target LDL less than 70 (Chronic)    Lipids have been well controlled on rosuvastatin.  No change.      Relevant Medications  furosemide (LASIX) 40 MG tablet   AAA (abdominal aortic aneurysm)- 4.3 cm by [place] 11/14 (Chronic)    The plan is for him to actually see Dr. Sherren Mocha Early for his next vascular surgical follow-up.  May require EVAR.  We will need to get his A. fib and heart failure situation resolved prior to this.      Relevant  Medications   furosemide (LASIX) 40 MG tablet   Acute on chronic diastolic CHF (congestive heart failure), NYHA class 2 ([place])    He has had diastolic dysfunction since his LAD PCI, but has not had heart any heart failure symptoms until his recent episode of A. fib.  Even though he is no longer in A. fib, he is definitely showing signs of heart failure with PND, orthopnea and edema.  Some of the edema could be attributed to diltiazem, but not really at this dose. Plan: We will start furosemide 40 mg which she will begin today take 2 tomorrow (twice daily, and then once daily until he seen in follow-up scheduled appointment with Kerin Ransom, [place] on February 22.  We will also prescribe potassium chloride 20 mg to take with each dose of Lasix.  We will determine what standing dose he will need to be on at the visit with Kerin Ransom. He will have chemistry panel checked at that visit.      Relevant Medications   furosemide (LASIX) 40 MG tablet       COVID-19 Education: The signs and symptoms of COVID-19 were discussed with the patient and how to seek care for testing (follow up with PCP or arrange E-visit).   The importance of social distancing was discussed today.  I spent a total of 22 minutes with the patient. >  50% of the time was spent in direct patient consultation.  Additional time spent with chart review  / charting (studies, outside notes, etc): 10 Total Time: 32 min  Current medicines are reviewed at length with the patient today.  (+/- concerns) n/a   Patient Instructions / Medication Changes & Studies & Tests Ordered   Patient Instructions  Medication Instructions:   START TAKING  LASIX  40 MG tablet  PLUS POTASSIUM 20 MEQ tablet   TAKE FIRST DOSE TONIGHT  TOMORROW   40 MG  TWICE A DAY   THEN ON 2/ 18/21  UNTIL  05/16/19  One tablet every day WHEN YOU SEE [facility] PA  *If you need a refill on your cardiac medications before your next appointment, please call your  pharmacy*  Lab Work: Bmp on the feb 21,2021 If you have labs (blood work) drawn today and your tests are completely normal, you will receive your results only by: Marland Kitchen MyChart Message (if you have MyChart) OR . A paper copy in the mail If you have any lab test that is abnormal or we need to change your treatment, we will call you to review the results.  Testing/Procedures: Not needed  Follow-Up: At [facility], you and your health needs are our priority.  As part of our continuing mission to provide you with exceptional heart care, we have created designated Provider Care Teams.  These Care Teams include your primary Cardiologist (physician) and Advanced Practice Providers (APPs -  Physician Assistants and Nurse Practitioners) who all work together to provide you with the care you need, when you need it.  Your next appointment:    [place]   The format for your next appointment:  In Person  Provider:   Kerin Ransom, PA-C  Other Instructions N/A    Studies Ordered:   Orders Placed This Encounter  Procedures  . Basic metabolic panel  . EKG 12-Lead     Glenetta Hew, M.D., M.S. Interventional Cardiologist   Pager # [phone] Phone # [phone] [address]. [place], [place] [address]   Thank you for choosing Heartcare at [facility]!!

## 2019-05-10 NOTE — Assessment & Plan Note (Signed)
Lipids have been well controlled on rosuvastatin.  No change.

## 2019-05-10 NOTE — Assessment & Plan Note (Addendum)
His current EKG is difficult to read but it but does appear to clearly have P waves and so is probably more consistent with sinus rhythm, sinus arrhythmia with significant PACs.  Definitely has substrate for going back into A. fib, but is currently in sinus rhythm.  Despite this, he is still having the dyspnea symptoms consistent with heart failure from A. Fib.  He says his rate has been well controlled with addition of diltiazem, but he is now having edema, PND and orthopnea.   This patients CHA2DS2-VASc Score and unadjusted Ischemic Stroke Rate (% per year) is equal to 3.2 % stroke rate/year from a score of 3 --> on apixaban.  Above score calculated as 1 point each if present [CHF, HTN, DM, Vascular=MI/PAD/Aortic Plaque, Age if 65-74, or Male] Above score calculated as 2 points each if present [Age > 75, or Stroke/TIA/TE]

## 2019-05-10 NOTE — Assessment & Plan Note (Signed)
He has had diastolic dysfunction since his LAD PCI, but has not had heart any heart failure symptoms until his recent episode of A. fib.  Even though he is no longer in A. fib, he is definitely showing signs of heart failure with PND, orthopnea and edema.  Some of the edema could be attributed to diltiazem, but not really at this dose. Plan: We will start furosemide 40 mg which she will begin today take 2 tomorrow (twice daily, and then once daily until he seen in follow-up scheduled appointment with Kerin Ransom, [place] on February 22.  We will also prescribe potassium chloride 20 mg to take with each dose of Lasix.  We will determine what standing dose he will need to be on at the visit with Kerin Ransom. He will have chemistry panel checked at that visit.

## 2019-05-10 NOTE — Assessment & Plan Note (Signed)
History of single-vessel CAD with PCI to the LAD between 6 to 7 years ago.  For the most part he has been doing very very well until he had chest pain and dyspnea associated with A. Fib.  Nonischemic Myoview during last hospital stay as part of evaluation for A. fib.  Had not really been on many medications because of low blood pressures.  Was started on diltiazem at last visit for rate control which with normal EF is adequate for antianginal benefit and replacement of beta-blocker.  (We did not use a beta-blocker because of history of fatigue and AV nodal block)  He is on moderate dose atorvastatin with well-controlled lipids as of last check.

## 2019-05-10 NOTE — Assessment & Plan Note (Signed)
About 7 years out from tandem DES PCI to LAD -> no longer on aspirin and Plavix as he has been started on apixaban.

## 2019-05-11 ENCOUNTER — Other Ambulatory Visit (INDEPENDENT_AMBULATORY_CARE_PROVIDER_SITE_OTHER): Payer: Self-pay

## 2019-05-11 ENCOUNTER — Telehealth (INDEPENDENT_AMBULATORY_CARE_PROVIDER_SITE_OTHER): Payer: Self-pay

## 2019-05-11 ENCOUNTER — Telehealth: Payer: Self-pay | Admitting: Cardiology

## 2019-05-11 NOTE — Progress Notes (Unsigned)
Fluticasone

## 2019-05-11 NOTE — Telephone Encounter (Signed)
SPOKE TO PATIENT  DATE  05/15/19 WAS IN ERROR    PER DR HARDING PATIENT CAN HAVE LABS DOE THE SAME DAY HE SEES [facility] ON 05/17/19   PATIENT VERBALIZED UNDERSTANDING WILL HAVE LABS DONE 05/16/19

## 2019-05-11 NOTE — Telephone Encounter (Signed)
Follow Up:   Pt wants to know when is he supposed to come in for his lab work. On his sheet from yesterday it said 05-15-19 and that is on a Sunday.

## 2019-05-12 ENCOUNTER — Telehealth (INDEPENDENT_AMBULATORY_CARE_PROVIDER_SITE_OTHER): Payer: Self-pay

## 2019-05-12 ENCOUNTER — Other Ambulatory Visit (INDEPENDENT_AMBULATORY_CARE_PROVIDER_SITE_OTHER): Payer: Self-pay

## 2019-05-13 DIAGNOSIS — R339 Retention of urine, unspecified: Secondary | ICD-10-CM | POA: Diagnosis not present

## 2019-05-13 DIAGNOSIS — R3914 Feeling of incomplete bladder emptying: Secondary | ICD-10-CM | POA: Diagnosis not present

## 2019-05-16 ENCOUNTER — Other Ambulatory Visit: Payer: Self-pay

## 2019-05-16 ENCOUNTER — Encounter: Payer: Self-pay | Admitting: Cardiology

## 2019-05-16 ENCOUNTER — Ambulatory Visit (INDEPENDENT_AMBULATORY_CARE_PROVIDER_SITE_OTHER): Payer: PPO | Admitting: Cardiology

## 2019-05-16 VITALS — BP 104/56 | HR 79 | Ht 70.0 in | Wt 183.4 lb

## 2019-05-16 DIAGNOSIS — I5033 Acute on chronic diastolic (congestive) heart failure: Secondary | ICD-10-CM

## 2019-05-16 DIAGNOSIS — R079 Chest pain, unspecified: Secondary | ICD-10-CM | POA: Diagnosis not present

## 2019-05-16 DIAGNOSIS — I4819 Other persistent atrial fibrillation: Secondary | ICD-10-CM | POA: Diagnosis not present

## 2019-05-16 DIAGNOSIS — I714 Abdominal aortic aneurysm, without rupture, unspecified: Secondary | ICD-10-CM

## 2019-05-16 DIAGNOSIS — Z9861 Coronary angioplasty status: Secondary | ICD-10-CM

## 2019-05-16 DIAGNOSIS — I2109 ST elevation (STEMI) myocardial infarction involving other coronary artery of anterior wall: Secondary | ICD-10-CM

## 2019-05-16 DIAGNOSIS — I2510 Atherosclerotic heart disease of native coronary artery without angina pectoris: Secondary | ICD-10-CM | POA: Diagnosis not present

## 2019-05-16 MED ORDER — ISOSORBIDE MONONITRATE ER 30 MG PO TB24: 30.0000 mg | ORAL_TABLET | Freq: Every day | ORAL | 3 refills | Status: DC

## 2019-05-16 MED ORDER — DILTIAZEM HCL ER COATED BEADS 180 MG PO CP24: 180.0000 mg | ORAL_CAPSULE | Freq: Every day | ORAL | 3 refills | Status: DC

## 2019-05-16 MED ORDER — POTASSIUM CHLORIDE CRYS ER 20 MEQ PO TBCR: 20.0000 meq | EXTENDED_RELEASE_TABLET | Freq: Every day | ORAL | 3 refills | Status: DC

## 2019-05-16 MED ORDER — FUROSEMIDE 40 MG PO TABS: 40.0000 mg | ORAL_TABLET | Freq: Every day | ORAL | 3 refills | Status: DC

## 2019-05-16 NOTE — Patient Instructions (Signed)
Medication Instructions:  Change Cardizem to Cardizem CD 180 mg daily.  Take Lasix 1 tablet daily.  Take potassium 20 mEq--1 tablet daily with lasix  START Isosorbide 30 mg daily.  *If you need a refill on your cardiac medications before your next appointment, please call your pharmacy*  Lab Work: Your physician recommends that you return for lab work today: BMET  If you have labs (blood work) drawn today and your tests are completely normal, you will receive your results only by: Marland Kitchen MyChart Message (if you have MyChart) OR . A paper copy in the mail If you have any lab test that is abnormal or we need to change your treatment, we will call you to review the results.  Follow-Up: At [facility], you and your health needs are our priority.  As part of our continuing mission to provide you with exceptional heart care, we have created designated Provider Care Teams.  These Care Teams include your primary Cardiologist (physician) and Advanced Practice Providers (APPs -  Physician Assistants and Nurse Practitioners) who all work together to provide you with the care you need, when you need it.  Your next appointment:   6-8 week(s)  The format for your next appointment:   In Person  Provider:   Glenetta Hew, MD  Other Instructions We will call you to discuss your Cardioversion prior to your follow-up appointment.

## 2019-05-16 NOTE — Assessment & Plan Note (Signed)
Pt seen today in follow up after medications adjusted by Dr Ellyn Hack.  He is breathing better, weight down 7 lbs.

## 2019-05-16 NOTE — Progress Notes (Signed)
Cardiology Office Note:    Date:  05/16/2019   ID:  [name], DOB 05-23-1931, MRN [id number]  PCP:  Lajean Manes, MD  Cardiologist:  Glenetta Hew, MD  Electrophysiologist:  None   Referring MD: Lajean Manes, MD   No chief complaint on file. F/U from OV 05/10/2019  History of Present Illness:    [name] is a 86 y.o. male with a hx of CAD, PAF, CAD, and HLD.  He presented to [facility] 02/10/13 as a STEMI. He had VF arrest en route to the ER and was defibrillated and had CPR by EMS. He had an urgent cath and subsequent LAD DES by Dr Ellyn Hack.He did have atrial fibrillation during the previous MI, however he was not placed on anticoagulation therapy due to lack of recurrence and the suspicion that his initial A. fib was related to anterior MI. He has a history of intolerance to beta-blocker due to fatigue.   He was admitted to the ED 04/19/2019 with chest pain and was noted to be in AF with CVR.  OP Nuclear study was done and the patient was placed on a NOAC. His Myoview was low risk but not entirely normal with possible AS ischemia.  Dr Ellyn Hack saw him 05/14/2019 and added Diltiazem 60 mg TID.  At F/u 05/10/219 he was felt to be volume overloaded and Lasix was started with a pulse dose.   He returns to the office today for follow up.  He has lost 7 lbs since starting the Lasix.  His edema and SOB are improved.  He is in AF with CVR by EKG.  He was unaware he was in AF. He also says he has intermittent mid sternal chest pain, not clearly exertional and not severe by his description.   Past Medical History:  Diagnosis Date  . Abdominal aortic aneurysm ([place]) 03/2019    Mid aortic large diameter 5.0 cm.  Has increased compared to 4.6- 4.8 on prior study  . Arthritis   . CAD S/P percutaneous coronary angioplasty 02/13/2013   PROXIMAL-MID LAD 95-99% AND 80% TANDEM LESIONS - PROMUS PREMIER DES 2.75 MM X 24 MM (2.9 MM)  . Macular degeneration of left eye   . Paroxysmal atrial fibrillation  ([place]) 02/13/2013   Initially was peri-infarction at time of anterior MI; no recurrence from 2014 until January 2021.  Now persistent  . Prostate disease   . 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 ST elevation myocardial infarction (STEMI) of anterior wall, initial episode of care ([place]) 02/10/2013  . Tremors of nervous system    both hands-under control    Past Surgical History:  Procedure Laterality Date  . AAA DUPLEX  03/2019   AAA DUPLEX June 2020: Largest Ao Ddiameter unchanged from Oct 2019 -> ~4.6 cm; January 21: Mid aortic large diameter 5.0 cm.  Has increased compared to 4.6- 4.8 on prior study.    . APPENDECTOMY  age 66   . CYSTOSCOPY W/ RETROGRADES Bilateral 02/03/2014   Procedure:  BILATERAL RETROGRADE PYELOGRAM;  Surgeon: Festus Aloe, MD;  Location: [facility];  Service: Urology;  Laterality: Bilateral;  . GREEN LIGHT LASER TURP (TRANSURETHRAL RESECTION OF PROSTATE N/A 02/03/2014   Procedure: GREEN LIGHT LASER TURP (TRANSURETHRAL RESECTION OF PROSTATE PHOTO VAPORIZATION;  Surgeon: Festus Aloe, MD;  Location: [facility];  Service: Urology;  Laterality: N/A;  . HERNIA REPAIR Left 2000  . LEFT HEART CATHETERIZATION WITH CORONARY ANGIOGRAM N/A 02/10/2013   Procedure: LEFT  HEART CATHETERIZATION WITH CORONARY ANGIOGRAM;  Surgeon: Leonie Man, MD;  Location: [facility] CATH LAB;  Service: Cardiovascular;  Laterality: N/A;  . NM MYOVIEW LTD  03/2019   Performed to evaluate A. fib: Baseline A. fib.  No ST segment changes.  EF estimated 49% with normal wall motion, but gating was limited because of A. fib.  Small size, mild severity mostly fixed apical defect suggestive of small prior infarct with mild peri-infarct ischemia.  LOW RISK.  No evidence of large or high risk ischemia present.  (Likely consistent with prior infarct)  . PERCUTANEOUS CORONARY STENT INTERVENTION (PCI-S)  02/10/13   PROXIMAL-MID LAD 95-99% AND 80% TANDEM LESIONS - PROMUS PREMIER DES 2.75 MM X 24 MM (2.9 MM)  . shot  in left eye 03/2019 for macular degeneration    . TRANSTHORACIC ECHOCARDIOGRAM  03/2019   Normal LVEF 50 to 55%.  No R WMA.  Unable to assess diastolic function because of A. fib.  Mild LA dilation.  Mild aortic sclerosis without stenosis    Current Medications: Current Meds  Medication Sig  . alfuzosin (UROXATRAL) 10 MG 24 hr tablet Take 10 mg by mouth daily.  Marland Kitchen apixaban (ELIQUIS) 5 MG TABS tablet Take 1 tablet (5 mg total) by mouth 2 (two) times daily.  Marland Kitchen atorvastatin (LIPITOR) 20 MG tablet Take 20 mg by mouth daily at 6 PM.   . Coenzyme Q10 (Q-10 CO-ENZYME PO) Take 100 mg by mouth daily.   Marland Kitchen dutasteride (AVODART) 0.5 MG capsule Take 0.5 mg by mouth daily.   . fluticasone (FLONASE) 50 MCG/ACT nasal spray Place 2 sprays into both nostrils daily as needed for allergies.   . furosemide (LASIX) 40 MG tablet Take 1 tablet (40 mg total) by mouth daily. or as ditercted  . levocetirizine (XYZAL) 5 MG tablet Take 5 mg by mouth every evening.   . Multiple Vitamins-Minerals (ICAPS AREDS 2 PO) Take 1 tablet by mouth daily.   . nitroGLYCERIN (NITROSTAT) 0.4 MG SL tablet PLACE 1 TABLET UNDER THE TONGUE EVERY 5 MINUTES AS NEEDED FOR CHEST PAIN (Patient taking differently: Place 0.4 mg under the tongue every 5 (five) minutes as needed for chest pain. )  . potassium chloride SA (KLOR-CON) 20 MEQ tablet Take 1 tablet (20 mEq total) by mouth daily. with lasix  . primidone (MYSOLINE) 50 MG tablet Take 50 mg by mouth 2 (two) times daily.   . vitamin C (ASCORBIC ACID) 500 MG tablet Take 500 mg by mouth daily.  . [DISCONTINUED] diltiazem (CARDIZEM) 60 MG tablet Take 1 tablet (60 mg total) by mouth 3 (three) times daily.  . [DISCONTINUED] furosemide (LASIX) 40 MG tablet Take 1 tablet (40 mg total) by mouth 2 (two) times daily. or as ditercted  . [DISCONTINUED] potassium chloride SA (KLOR-CON) 20 MEQ tablet Take 20 meq tablet with lasix as needed     Allergies:   Ciprofloxacin   Social History   Socioeconomic  History  . Marital status: Married    Spouse name: Earlie Server  . Number of children: Not on file  . Years of education: Not on file  . Highest education level: Not on file  Occupational History  . Occupation: retired  Tobacco Use  . Smoking status: Former Smoker    Packs/day: 1.00    Years: 10.00    Pack years: 10.00    Types: Cigarettes    Quit date: 06/07/1983    Years since quitting: 35.9  . Smokeless tobacco: Never Used  Substance and  Sexual Activity  . Alcohol use: Yes    Alcohol/week: 0.0 standard drinks    Comment: social  . Drug use: No  . Sexual activity: Not on file  Other Topics Concern  . Not on file  Social History Narrative   He is married.     Home - lives with wife; NOK: Wife, Rupert Baumhardt, [phone]      Retired.   Quit smoking in 1985. Does not drink alcohol.   Social Determinants of Health   Financial Resource Strain:   . Difficulty of Paying Living Expenses: Not on file  Food Insecurity:   . Worried About Charity fundraiser in the Last Year: Not on file  . Ran Out of Food in the Last Year: Not on file  Transportation Needs:   . Lack of Transportation (Medical): Not on file  . Lack of Transportation (Non-Medical): Not on file  Physical Activity:   . Days of Exercise per Week: Not on file  . Minutes of Exercise per Session: Not on file  Stress:   . Feeling of Stress : Not on file  Social Connections:   . Frequency of Communication with Friends and Family: Not on file  . Frequency of Social Gatherings with Friends and Family: Not on file  . Attends Religious Services: Not on file  . Active Member of Clubs or Organizations: Not on file  . Attends Archivist Meetings: Not on file  . Marital Status: Not on file     Family History: The patient's family history is not on file.  ROS:   Please see the history of present illness.     All other systems reviewed and are negative.  EKGs/Labs/Other Studies Reviewed:    The following  studies were reviewed today: Myoview 04/14/2019  EKG:  EKG is ordered today.  The ekg ordered today demonstrates AF with VR 72  Recent Labs: 04/14/2019: BUN 20; Creatinine, Ser 1.23; Hemoglobin 14.1; Platelets 155; Potassium 3.9; Sodium 138; TSH 3.255  Recent Lipid Panel    Component Value Date/Time   CHOL 107 06/14/2014 0807   TRIG 81 06/14/2014 0807   HDL 38 (L) 06/14/2014 0807   CHOLHDL 2.7 06/14/2013 0808   VLDL 13 06/14/2013 0808   LDLCALC 53 06/14/2014 0807    Physical Exam:    VS:  BP (!) 104/56   Pulse 79   Ht 5\' 10"  (1.778 m)   Wt 183 lb 6.4 oz (83.2 kg)   SpO2 99%   BMI 26.32 kg/m     Wt Readings from Last 3 Encounters:  05/16/19 183 lb 6.4 oz (83.2 kg)  05/10/19 190 lb 12.8 oz (86.5 kg)  05/04/19 188 lb (85.3 kg)     GEN:  Well nourished, well developed in no acute distress HEENT: Normal NECK: No JVD; No carotid bruits CARDIAC: irregularly irregular, no murmurs, rubs, gallops RESPIRATORY:  Few crackles Rt base, otherwise clear.  ABDOMEN: Soft, non-tender, non-distended MUSCULOSKELETAL:  Trace to 1+ LE edema. No hair growth on feet or toes, diminished distal pulses bilaterally no history of claudication SKIN: Warm and dry NEUROLOGIC:  Alert and oriented x 3 PSYCHIATRIC:  Normal affect   ASSESSMENT:    Acute on chronic diastolic CHF (congestive heart failure), NYHA class 2 (HCC) Pt seen today in follow up after medications adjusted by Dr Ellyn Hack.  He is breathing better, weight down 7 lbs.  PAF- Rate controlled.  He does not seem to be symptomatic  Chronic anticoagulation- On Eliquis-  he has macular degeneration and will need an injection in his Lt eye at some point.  AC would have to be held for this.  Chest pain- possibly anginal- try adding low dose nitrate  PVD- Known AAA and LEA disease (asymptomatic)  On exam.     PLAN:    Plan:  Change diltiazem to CD 180 QD.  Contniue current dose of Lasix- check BMP today.    I'll discuss further  with Dr Ellyn Hack- not sure he needs DCCV- if he does then his injection for macular degeneration would need to be put off secondary to anticoagulation.    Medication Adjustments/Labs and Tests Ordered: Current medicines are reviewed at length with the patient today.  Concerns regarding medicines are outlined above.  Orders Placed This Encounter  Procedures  . Basic metabolic panel  . EKG 12-Lead   Meds ordered this encounter  Medications  . furosemide (LASIX) 40 MG tablet    Sig: Take 1 tablet (40 mg total) by mouth daily. or as ditercted    Dispense:  30 tablet    Refill:  3  . potassium chloride SA (KLOR-CON) 20 MEQ tablet    Sig: Take 1 tablet (20 mEq total) by mouth daily. with lasix    Dispense:  30 tablet    Refill:  3  . diltiazem (CARDIZEM CD) 180 MG 24 hr capsule    Sig: Take 1 capsule (180 mg total) by mouth daily.    Dispense:  30 capsule    Refill:  3  . isosorbide mononitrate (IMDUR) 30 MG 24 hr tablet    Sig: Take 1 tablet (30 mg total) by mouth daily.    Dispense:  30 tablet    Refill:  3    There are no Patient Instructions on file for this visit.   Signed, Kerin Ransom, PA-C  05/16/2019 3:12 PM    [facility]

## 2019-05-17 ENCOUNTER — Telehealth: Payer: Self-pay | Admitting: Cardiology

## 2019-05-17 LAB — BASIC METABOLIC PANEL
BUN/Creatinine Ratio: 17 (ref 10–24)
BUN: 20 mg/dL (ref 8–27)
CO2: 24 mmol/L (ref 20–29)
Calcium: 9.8 mg/dL (ref 8.6–10.2)
Chloride: 99 mmol/L (ref 96–106)
Creatinine, Ser: 1.15 mg/dL (ref 0.76–1.27)
GFR calc Af Amer: 66 mL/min/{1.73_m2} (ref >59)
GFR calc non Af Amer: 57 mL/min/{1.73_m2} — ABNORMAL LOW (ref >59)
Glucose: 135 mg/dL — ABNORMAL HIGH (ref 65–99)
Potassium: 4.6 mmol/L (ref 3.5–5.2)
Sodium: 137 mmol/L (ref 134–144)

## 2019-05-17 NOTE — Telephone Encounter (Signed)
I called the patient twice today to let him know Dr Allison Quarry recommendations.  I was able to leave a message this morning but this afternoon no calls went through.  Kerin Ransom PA-C 05/17/2019 1:31 PM

## 2019-06-10 DIAGNOSIS — R339 Retention of urine, unspecified: Secondary | ICD-10-CM | POA: Diagnosis not present

## 2019-06-10 DIAGNOSIS — R3914 Feeling of incomplete bladder emptying: Secondary | ICD-10-CM | POA: Diagnosis not present

## 2019-06-21 DIAGNOSIS — I10 Essential (primary) hypertension: Secondary | ICD-10-CM | POA: Diagnosis not present

## 2019-06-21 DIAGNOSIS — I25119 Atherosclerotic heart disease of native coronary artery with unspecified angina pectoris: Secondary | ICD-10-CM | POA: Diagnosis not present

## 2019-06-21 DIAGNOSIS — N400 Enlarged prostate without lower urinary tract symptoms: Secondary | ICD-10-CM | POA: Diagnosis not present

## 2019-06-21 DIAGNOSIS — E7800 Pure hypercholesterolemia, unspecified: Secondary | ICD-10-CM | POA: Diagnosis not present

## 2019-06-21 DIAGNOSIS — I5032 Chronic diastolic (congestive) heart failure: Secondary | ICD-10-CM | POA: Diagnosis not present

## 2019-06-29 ENCOUNTER — Ambulatory Visit (INDEPENDENT_AMBULATORY_CARE_PROVIDER_SITE_OTHER): Payer: PPO | Admitting: Cardiology

## 2019-06-29 ENCOUNTER — Ambulatory Visit: Payer: PPO | Admitting: Podiatry

## 2019-06-29 ENCOUNTER — Encounter: Payer: Self-pay | Admitting: Podiatry

## 2019-06-29 ENCOUNTER — Encounter: Payer: Self-pay | Admitting: Cardiology

## 2019-06-29 ENCOUNTER — Other Ambulatory Visit: Payer: Self-pay

## 2019-06-29 VITALS — BP 109/64 | HR 82 | Ht 71.0 in | Wt 180.4 lb

## 2019-06-29 VITALS — Temp 97.5°F

## 2019-06-29 DIAGNOSIS — I714 Abdominal aortic aneurysm, without rupture, unspecified: Secondary | ICD-10-CM

## 2019-06-29 DIAGNOSIS — I25119 Atherosclerotic heart disease of native coronary artery with unspecified angina pectoris: Secondary | ICD-10-CM

## 2019-06-29 DIAGNOSIS — Z9861 Coronary angioplasty status: Secondary | ICD-10-CM | POA: Diagnosis not present

## 2019-06-29 DIAGNOSIS — D689 Coagulation defect, unspecified: Secondary | ICD-10-CM | POA: Diagnosis not present

## 2019-06-29 DIAGNOSIS — I2510 Atherosclerotic heart disease of native coronary artery without angina pectoris: Secondary | ICD-10-CM

## 2019-06-29 DIAGNOSIS — M79676 Pain in unspecified toe(s): Secondary | ICD-10-CM

## 2019-06-29 DIAGNOSIS — B351 Tinea unguium: Secondary | ICD-10-CM | POA: Diagnosis not present

## 2019-06-29 DIAGNOSIS — M129 Arthropathy, unspecified: Secondary | ICD-10-CM | POA: Insufficient documentation

## 2019-06-29 DIAGNOSIS — I5033 Acute on chronic diastolic (congestive) heart failure: Secondary | ICD-10-CM

## 2019-06-29 DIAGNOSIS — I4819 Other persistent atrial fibrillation: Secondary | ICD-10-CM | POA: Diagnosis not present

## 2019-06-29 NOTE — Progress Notes (Signed)
Primary Care Provider: Lajean Manes, MD Cardiologist: Glenetta Hew, MD Electrophysiologist: None  Clinic Note: Chief Complaint  Patient presents with  . Follow-up    Feels much better.  . Atrial Fibrillation    Feeling better today  . Coronary Artery Disease    No angina  . Edema    Much improved  . Congestive Heart Failure    Diastolic-related to A. fib.  Significantly improved.    HPI:    [name] is a 86 y.o. male with a PMH notable for CAD, AAA (see below) & recurrence of PERSISTENT ATRIAL FIBRILLATION with [place] who presents for essentially 59-month follow-up after short follow-up with Kerin Ransom, PA  Cardiac History  History of anteriorSTEMIin November 2014 - with VT: PROXIMAL-MID LAD 95-99% AND 80% TANDEM LESIONS -- PCI w/ PROMUS PREMIER DES 2.75 MM X 24 MM (2.9 MM)   STEMI complicated by Afib - no further recurrence until January 2021  Initial mild ICM - resolved with EF 50-55% (06/2014)  He had ventricular tachycardia as well as atrial fibrillation peri-infarct, but no residual episodes.  AAA - followed by Julianne Handler  Has been intolerant of beta-blocker because of fatigue and hypotension, also no ARB because of hypotension.  Recurrent ATRIAL FIBRILLATION -initially found during hospitalization April 14, 2019 -> admitted for chest pain, found to have A. fib (Echocardiogram and Myoview ordered, both relatively normal).  Started on apixaban but no rate control.  Daryel November was last seen by me in February 2021.  He noted his heart rate was improved, but was noticing increased swelling and dyspnea.  He was in controlled rate of A. fib.  He was started on DOAC. -> Evaluation with Myoview showed no ischemia and essentially normal echo.  Was started Lasix for A. fib related acute diastolic heart failure.  Seen in close follow-up by Kerin Ransom, PA (on February 22) had noted 7 pound weight loss since starting Lasix.  Edema and shortness of  breath improved.  A. fib stable.  Intermittent chest pain but not exertional.  Low-dose Imdur initiated.  Has been troubled by gout now.  Recent Hospitalizations:   N/A   Reviewed  CV studies:    The following studies were reviewed today: (if available, images/films reviewed: From Epic Chart or Care Everywhere) . n/a   Interval History:   [name] presents here today feeling much better today.  He had an episode about 3 weeks ago when he felt really strange.  He felt dizzy woozy flushed and as though he may pass out.  That lasted a few minutes and then the next day he felt much better.  He also noted when he went outside to do his routine chores and yard work that he felt much better energy level with no more dyspnea.  His edema has notably improved.  He says he is almost fully back to his baseline level of exertion now.  Since that one dizzy spell, he has not had any further syncope or near syncope type symptoms.  No sensation of palpitations or irregular heartbeats.  Again no bleeding issues with Eliquis.  CV Review of Symptoms (Summary) Cardiovascular ROS: positive for - dyspnea on exertion, edema, irregular heartbeat, orthopnea, paroxysmal nocturnal dyspnea and Fatigue, exercise intolerance that is worse than it had been negative for - chest pain, rapid heart rate or Syncope/near syncope, TIA/amaurosis fugax.  Claudication  The patient DOES NOT have symptoms concerning for COVID-19 infection (fever, chills, cough,  or new shortness of breath).  The patient is practicing social distancing & Masking.   He has completed his 2 COVID-19 vaccine injections  REVIEWED OF SYSTEMS   A comprehensive ROS was performed. Review of Systems  Constitutional: Negative for malaise/fatigue (Notably improved over the last 3 weeks) and weight loss.  HENT: Negative for nosebleeds.   Respiratory: Negative for shortness of breath and wheezing (Wife notes that he is wheezing at night).     Cardiovascular: Negative for orthopnea (Back to sleeping on 1 pillow), leg swelling and PND.  Gastrointestinal: Negative for blood in stool and melena.  Genitourinary: Negative for hematuria.  Musculoskeletal: Positive for joint pain. Negative for falls.  Neurological: Negative for dizziness and weakness (Feels much better now.  He had a brief spell of weakness and dizziness 3 weeks ago, and the next day he felt much better.).  Psychiatric/Behavioral: Negative for memory loss. The patient is not nervous/anxious and does not have insomnia.    I have reviewed and (if needed) personally updated the patient's problem list, medications, allergies, past medical and surgical history, social and family history.   PAST MEDICAL HISTORY   Past Medical History:  Diagnosis Date  . Abdominal aortic aneurysm ([place]) 03/2019    Mid aortic large diameter 5.0 cm.  Has increased compared to 4.6- 4.8 on prior study  . Arthritis   . CAD S/P percutaneous coronary angioplasty 02/13/2013   PROXIMAL-MID LAD 95-99% AND 80% TANDEM LESIONS - PROMUS PREMIER DES 2.75 MM X 24 MM (2.9 MM)  . Macular degeneration of left eye   . Paroxysmal (persistent) atrial fibrillation ([place]) 02/13/2013   Initially was peri-infarction at time of anterior MI; no recurrence from 2014 until January 2021.  Now persistent  . Prostate disease   . 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 ST elevation myocardial infarction (STEMI) of anterior wall, initial episode of care ([place]) 02/10/2013  . Tremors of nervous system    both hands-under control    PAST SURGICAL HISTORY   Past Surgical History:  Procedure Laterality Date  . AAA DUPLEX  03/2019   AAA DUPLEX June 2020: Largest Ao Ddiameter unchanged from Oct 2019 -> ~4.6 cm; January 21: Mid aortic large diameter 5.0 cm.  Has increased compared to 4.6- 4.8 on prior study.    . APPENDECTOMY  age 15   . CYSTOSCOPY W/ RETROGRADES Bilateral 02/03/2014   Procedure:  BILATERAL RETROGRADE  PYELOGRAM;  Surgeon: Festus Aloe, MD;  Location: [facility];  Service: Urology;  Laterality: Bilateral;  . GREEN LIGHT LASER TURP (TRANSURETHRAL RESECTION OF PROSTATE N/A 02/03/2014   Procedure: GREEN LIGHT LASER TURP (TRANSURETHRAL RESECTION OF PROSTATE PHOTO VAPORIZATION;  Surgeon: Festus Aloe, MD;  Location: [facility];  Service: Urology;  Laterality: N/A;  . HERNIA REPAIR Left 2000  . LEFT HEART CATHETERIZATION WITH CORONARY ANGIOGRAM N/A 02/10/2013   Procedure: LEFT HEART CATHETERIZATION WITH CORONARY ANGIOGRAM;  Surgeon: Leonie Man, MD;  Location[facility] CATH LAB;  Service: Cardiovascular;  Laterality: N/A;  . NM MYOVIEW LTD  03/2019   Performed to evaluate A. fib: Baseline A. fib.  No ST segment changes.  EF estimated 49% with normal wall motion, but gating was limited because of A. fib.  Small size, mild severity mostly fixed apical defect suggestive of small prior infarct with mild peri-infarct ischemia.  LOW RISK.  No evidence of large or high risk ischemia present.  (Likely consistent with prior infarct)  . PERCUTANEOUS CORONARY STENT INTERVENTION (  PCI-S)  02/10/13   PROXIMAL-MID LAD 95-99% AND 80% TANDEM LESIONS - PROMUS PREMIER DES 2.75 MM X 24 MM (2.9 MM)  . shot in left eye 03/2019 for macular degeneration    . TRANSTHORACIC ECHOCARDIOGRAM  03/2019   Normal LVEF 50 to 55%.  No R WMA.  Unable to assess diastolic function because of A. fib.  Mild LA dilation.  Mild aortic sclerosis without stenosis    MEDICATIONS/ALLERGIES   Current Meds  Medication Sig  . alfuzosin (UROXATRAL) 10 MG 24 hr tablet Take 10 mg by mouth daily.  Marland Kitchen apixaban (ELIQUIS) 5 MG TABS tablet Take 1 tablet (5 mg total) by mouth 2 (two) times daily.  Marland Kitchen atorvastatin (LIPITOR) 20 MG tablet Take 20 mg by mouth daily at 6 PM.   . Coenzyme Q10 (Q-10 CO-ENZYME PO) Take 100 mg by mouth daily.   Marland Kitchen diltiazem (CARDIZEM CD) 180 MG 24 hr capsule Take 1 capsule (180 mg total) by mouth daily.  Marland Kitchen dutasteride (AVODART) 0.5 MG  capsule Take 0.5 mg by mouth daily.   . fluticasone (FLONASE) 50 MCG/ACT nasal spray Place 2 sprays into both nostrils daily as needed for allergies.   . furosemide (LASIX) 40 MG tablet Take 1 tablet (40 mg total) by mouth daily. or as ditercted  . isosorbide mononitrate (IMDUR) 30 MG 24 hr tablet Take 1 tablet (30 mg total) by mouth daily.  Marland Kitchen levocetirizine (XYZAL) 5 MG tablet Take 5 mg by mouth every evening.   . Multiple Vitamins-Minerals (ICAPS AREDS 2 PO) Take 1 tablet by mouth daily.   . nitroGLYCERIN (NITROSTAT) 0.4 MG SL tablet PLACE 1 TABLET UNDER THE TONGUE EVERY 5 MINUTES AS NEEDED FOR CHEST PAIN (Patient taking differently: Place 0.4 mg under the tongue every 5 (five) minutes as needed for chest pain. )  . potassium chloride SA (KLOR-CON) 20 MEQ tablet Take 1 tablet (20 mEq total) by mouth daily. with lasix  . primidone (MYSOLINE) 50 MG tablet Take 50 mg by mouth 2 (two) times daily.   . vitamin C (ASCORBIC ACID) 500 MG tablet Take 500 mg by mouth daily.    Allergies  Allergen Reactions  . Ciprofloxacin Diarrhea and Nausea Only    SOCIAL HISTORY/FAMILY HISTORY   Social History   Tobacco Use  . Smoking status: Former Smoker    Packs/day: 1.00    Years: 10.00    Pack years: 10.00    Types: Cigarettes    Quit date: 06/07/1983    Years since quitting: 36.0  . Smokeless tobacco: Never Used  Substance Use Topics  . Alcohol use: Yes    Alcohol/week: 0.0 standard drinks    Comment: social  . Drug use: No   Social History   Social History Narrative   He is married.     Home - lives with wife; NOK: Wife, Kamaren Deadmond, [phone]      Retired.   Quit smoking in 1985. Does not drink alcohol.    Family History family history is not on file.  OBJCTIVE -PE, EKG, labs   Wt Readings from Last 3 Encounters:  06/29/19 180 lb 6.4 oz (81.8 kg)  05/16/19 183 lb 6.4 oz (83.2 kg)  05/10/19 190 lb 12.8 oz (86.5 kg)    Physical Exam: BP 109/64   Pulse 82   Ht 5\' 11"   (1.803 m)   Wt 180 lb 6.4 oz (81.8 kg)   SpO2 98%   BMI 25.16 kg/m  Physical Exam  Constitutional: He is  oriented to person, place, and time. He appears well-developed and well-nourished. No distress.  Healthy-appearing elderly gentleman.  Well-groomed.  Notably improved symptomatically.  HENT:  Head: Normocephalic and atraumatic.  Neck: No hepatojugular reflux and no JVD (Less pulsatile with no cannon A waves, but roughly 8-9 cmH2O) present. Carotid bruit is not present (Likely radiated aortic murmur).  Cardiovascular: Normal rate, regular rhythm, S1 normal, S2 normal and intact distal pulses.  No extrasystoles are present. PMI is not displaced. Exam reveals distant heart sounds. Exam reveals no gallop and no friction rub. Decreased pulses: Diminished pedal pulses secondary to edema.  Murmur (1/6 SEM at RUSB --> carotids) heard. Pulmonary/Chest: Effort normal. No respiratory distress. He has no wheezes. He has no rales.  Good air movement  Abdominal: Soft. Bowel sounds are normal. He exhibits no distension. There is no abdominal tenderness. There is no rebound.  Musculoskeletal:        General: No edema (2+ right greater than left LE edema.). Normal range of motion.     Cervical back: Normal range of motion and neck supple.  Neurological: He is alert and oriented to person, place, and time.  Psychiatric: He has a normal mood and affect. His behavior is normal. Judgment and thought content normal.  Vitals reviewed.   Adult ECG Report  Rate: 75;  Rhythm: normal sinus rhythm and Nonspecific ST-T wave changes.;   Narrative Interpretation when compared to last EKG, now in sinus rhythm.  Recent Labs: June 2020-TC 106, TG 106, HDL 34, LDL 51.  A1c 5.7. -->  Is due for follow-up in June 2021  Lab Results  Component Value Date   CREATININE 1.15 05/16/2019   BUN 20 05/16/2019   NA 137 05/16/2019   K 4.6 05/16/2019   CL 99 05/16/2019   CO2 24 05/16/2019  Apixaban  Lab Results  Component  Value Date   TSH 3.255 04/14/2019    ASSESSMENT/PLAN    Problem List Items Addressed This Visit    CAD S/P Prox-Mid LAD; Promus DES 02/10/13 (Chronic)    Distant history now of PCI to the LAD.  No longer on antiplatelet agent as he is on Eliquis for A. fib. No further anginal symptoms.      Persistent atrial fibrillation (HCC)-CHA2DS2-VASc score 3-4.  On Eliquis - Primary (Chronic)    He is currently now back in sinus rhythm, I think that dizzy spell that he had about 3 weeks ago marked his conversion to sinus rhythm from rate controlled A. fib.  I would not be surprised if he had a sinus pause and some prolonged sinus recovery time leading to the dizzy spell.  We will need to monitor for signs of bradycardia if he does go in and out of A. fib.  Clearly is asymptomatic even with rate controlled A. fib, therefore would prefer rhythm control if he does have recurrence. For now we will plan for DCCV if possible and consider the possibility of as needed as needed antiarrhythmic agent for breakthrough.  Doing well on Eliquis.  For now less monitor to see how often his recurrences are.  He now is well familiar with the difference in being in sinus rhythm versus A. fib as far as symptoms go. If he does have frequent recurrence, would probably want to consider an antiarrhythmic medication (however with CAD history, would be limited to dofetilide, sotalol or amiodarone). If he does have more frequent recurrence, will likely refer to A. fib clinic/electrophysiology to determine potential benefit of A.  fib ablation versus antiarrhythmic.       Relevant Orders   EKG 12-Lead (Completed)   Coronary artery disease involving native coronary artery of native heart with angina pectoris ([place]) (Chronic)    No further angina, and doing much better now that he is no longer in A. Fib. Nonischemic Myoview. Is on combination of Imdur plus diltiazem for antianginal benefit.  I think he probably had chest  discomfort and dyspnea associated with A. fib but not CAD as he is no longer symptomatic.  Plan: Continue current meds including atorvastatin at current dose.       Relevant Orders   EKG 12-Lead (Completed)   AAA (abdominal aortic aneurysm)- 4.3 cm by [place] 11/14 (Chronic)    Hopefully now he will maintain sinus rhythm.  Currently now stable and would be okay for EVAR--would prefer to avoid stopping Eliquis, but we could if necessary stop 2 days prior to procedure.      Acute on chronic diastolic CHF (congestive heart failure), NYHA class 2 (HCC) (Chronic)    Seems to now be pretty much resolved following diuresis and now with restoration of sinus rhythm.  He does have chronic diastolic dysfunction, and I suspect that being in A. fib is triggered the acute exacerbation.  For now, he is still doing his same dose of Lasix because he has 2+ edema.  However he has no more PND orthopnea symptoms.  I think a lot of the edema is simply related to venous stasis changes.  We talked about foot elevation and if possible support stockings.          COVID-19 Education: The signs and symptoms of COVID-19 were discussed with the patient and how to seek care for testing (follow up with PCP or arrange E-visit).   The importance of social distancing was discussed today.  I spent a total of 18 minutes with the patient. >  50% of the time was spent in direct patient consultation.  Additional time spent with chart review  / charting (studies, outside notes, etc): 6 Total Time: 24 min  Current medicines are reviewed at length with the patient today.  (+/- concerns) n/a   Patient Instructions / Medication Changes & Studies & Tests Ordered   Patient Instructions  Medication Instructions:   No changes except,  if you have shortness of breath or fatigue you can take an extra Lasix ( furosemide) with an additional potassium tablet that day    *If you need a refill on your cardiac medications before your  next appointment, please call your pharmacy*   Lab Work: Not needed   Testing/Procedures: Not needed    Follow-Up: At [facility], you and your health needs are our priority.  As part of our continuing mission to provide you with exceptional heart care, we have created designated Provider Care Teams.  These Care Teams include your primary Cardiologist (physician) and Advanced Practice Providers (APPs -  Physician Assistants and Nurse Practitioners) who all work together to provide you with the care you need, when you need it.    Your next appointment:   4 to 5 month(s)  The format for your next appointment:   In Person  Provider:   Glenetta Hew, MD   Other Instructions: Dr Ellyn Hack recommends if you feel like you did in Feb  ( fatigue) then call the office. If you fell like you did in Jan with chest pain then go to hospital      Studies Ordered:  Orders Placed This Encounter  Procedures  . EKG 12-Lead     Glenetta Hew, M.D., M.S. Interventional Cardiologist   Pager # [phone] Phone # [phone] [address] [place], [place] [address]   Thank you for choosing Heartcare at [facility]!!

## 2019-06-29 NOTE — Addendum Note (Signed)
Addended by: Gardiner Barefoot on: 06/29/2019 10:41 AM   Modules accepted: Level of Service

## 2019-06-29 NOTE — Progress Notes (Addendum)
This patient returns to my office for at risk foot care.  This patient requires this care by a professional since this patient will be at risk due to having coagulation defect.  Patient is taking eliquiss.  This patient is unable to cut nails himself since the patient cannot reach his nails.These nails are painful walking and wearing shoes.  This patient presents for at risk foot care today. Patient asks to have his big toe joint treated due to gout.  Patient is taking eliquiss.  General Appearance  Alert, conversant and in no acute stress.  Vascular  Dorsalis pedis and posterior tibial  pulses are palpable  bilaterally.  Capillary return is within normal limits  bilaterally. Temperature is within normal limits  bilaterally.  Neurologic  Senn-Weinstein monofilament wire test within normal limits  bilaterally. Muscle power within normal limits bilaterally.  Nails Thick disfigured discolored nails with subungual debris  from hallux to fifth toes bilaterally. No evidence of bacterial infection or drainage bilaterally.  Orthopedic  No limitations of motion  feet .  No crepitus or effusions noted.  No bony pathology or digital deformities noted.  DJD 1st MPJ  B/L.  Skin  normotropic skin with no porokeratosis noted bilaterally.  No signs of infections or ulcers noted.     Onychomycosis  Pain in right toes  Pain in left toes  Consent was obtained for treatment procedures.   Mechanical debridement of nails 1-5  bilaterally performed with a nail nipper.  Filed with dremel without incident. Told patient he has arthritis in big toe joint and not necessarily gout.  Continue soaks.  Wear thick sole shoes.   Return office visit     4 months                Told patient to return for periodic foot care and evaluation due to potential at risk complications.   Gardiner Barefoot DPM

## 2019-06-29 NOTE — Patient Instructions (Signed)
Medication Instructions:   No changes except,  if you have shortness of breath or fatigue you can take an extra Lasix ( furosemide) with an additional potassium tablet that day    *If you need a refill on your cardiac medications before your next appointment, please call your pharmacy*   Lab Work: Not needed   Testing/Procedures: Not needed    Follow-Up: At [facility], you and your health needs are our priority.  As part of our continuing mission to provide you with exceptional heart care, we have created designated Provider Care Teams.  These Care Teams include your primary Cardiologist (physician) and Advanced Practice Providers (APPs -  Physician Assistants and Nurse Practitioners) who all work together to provide you with the care you need, when you need it.    Your next appointment:   4 to 5 month(s)  The format for your next appointment:   In Person  Provider:   Glenetta Hew, MD   Other Instructions: Dr Ellyn Hack recommends if you feel like you did in Feb  ( fatigue) then call the office. If you fell like you did in Jan with chest pain then go to hospital

## 2019-07-01 ENCOUNTER — Encounter: Payer: Self-pay | Admitting: Cardiology

## 2019-07-01 DIAGNOSIS — I10 Essential (primary) hypertension: Secondary | ICD-10-CM | POA: Diagnosis not present

## 2019-07-01 DIAGNOSIS — I5032 Chronic diastolic (congestive) heart failure: Secondary | ICD-10-CM | POA: Diagnosis not present

## 2019-07-01 DIAGNOSIS — E7800 Pure hypercholesterolemia, unspecified: Secondary | ICD-10-CM | POA: Diagnosis not present

## 2019-07-01 NOTE — Assessment & Plan Note (Signed)
Hopefully now he will maintain sinus rhythm.  Currently now stable and would be okay for EVAR--would prefer to avoid stopping Eliquis, but we could if necessary stop 2 days prior to procedure.

## 2019-07-01 NOTE — Assessment & Plan Note (Addendum)
No further angina, and doing much better now that he is no longer in A. Fib. Nonischemic Myoview. Is on combination of Imdur plus diltiazem for antianginal benefit.  I think he probably had chest discomfort and dyspnea associated with A. fib but not CAD as he is no longer symptomatic.  Plan: Continue current meds including atorvastatin at current dose.

## 2019-07-01 NOTE — Assessment & Plan Note (Addendum)
He is currently now back in sinus rhythm, I think that dizzy spell that he had about 3 weeks ago marked his conversion to sinus rhythm from rate controlled A. fib.  I would not be surprised if he had a sinus pause and some prolonged sinus recovery time leading to the dizzy spell.  We will need to monitor for signs of bradycardia if he does go in and out of A. fib.  Clearly is asymptomatic even with rate controlled A. fib, therefore would prefer rhythm control if he does have recurrence. For now we will plan for DCCV if possible and consider the possibility of as needed as needed antiarrhythmic agent for breakthrough.  Doing well on Eliquis.  For now less monitor to see how often his recurrences are.  He now is well familiar with the difference in being in sinus rhythm versus A. fib as far as symptoms go. If he does have frequent recurrence, would probably want to consider an antiarrhythmic medication (however with CAD history, would be limited to dofetilide, sotalol or amiodarone). If he does have more frequent recurrence, will likely refer to A. fib clinic/electrophysiology to determine potential benefit of A. fib ablation versus antiarrhythmic.

## 2019-07-01 NOTE — Assessment & Plan Note (Signed)
Seems to now be pretty much resolved following diuresis and now with restoration of sinus rhythm.  He does have chronic diastolic dysfunction, and I suspect that being in A. fib is triggered the acute exacerbation.  For now, he is still doing his same dose of Lasix because he has 2+ edema.  However he has no more PND orthopnea symptoms.  I think a lot of the edema is simply related to venous stasis changes.  We talked about foot elevation and if possible support stockings.

## 2019-07-01 NOTE — Assessment & Plan Note (Signed)
Distant history now of PCI to the LAD.  No longer on antiplatelet agent as he is on Eliquis for A. fib. No further anginal symptoms.

## 2019-07-11 DIAGNOSIS — R339 Retention of urine, unspecified: Secondary | ICD-10-CM | POA: Diagnosis not present

## 2019-07-11 DIAGNOSIS — R3914 Feeling of incomplete bladder emptying: Secondary | ICD-10-CM | POA: Diagnosis not present

## 2019-07-13 DIAGNOSIS — H353223 Exudative age-related macular degeneration, left eye, with inactive scar: Secondary | ICD-10-CM | POA: Diagnosis not present

## 2019-08-01 DIAGNOSIS — Z789 Other specified health status: Secondary | ICD-10-CM | POA: Diagnosis not present

## 2019-08-01 DIAGNOSIS — D6869 Other thrombophilia: Secondary | ICD-10-CM | POA: Diagnosis not present

## 2019-08-01 DIAGNOSIS — I872 Venous insufficiency (chronic) (peripheral): Secondary | ICD-10-CM | POA: Diagnosis not present

## 2019-08-01 DIAGNOSIS — E7800 Pure hypercholesterolemia, unspecified: Secondary | ICD-10-CM | POA: Diagnosis not present

## 2019-08-01 DIAGNOSIS — I10 Essential (primary) hypertension: Secondary | ICD-10-CM | POA: Diagnosis not present

## 2019-08-01 DIAGNOSIS — I5032 Chronic diastolic (congestive) heart failure: Secondary | ICD-10-CM | POA: Diagnosis not present

## 2019-08-01 DIAGNOSIS — Z1389 Encounter for screening for other disorder: Secondary | ICD-10-CM | POA: Diagnosis not present

## 2019-08-01 DIAGNOSIS — I700 Atherosclerosis of aorta: Secondary | ICD-10-CM | POA: Diagnosis not present

## 2019-08-01 DIAGNOSIS — Z0000 Encounter for general adult medical examination without abnormal findings: Secondary | ICD-10-CM | POA: Diagnosis not present

## 2019-08-01 DIAGNOSIS — Z79899 Other long term (current) drug therapy: Secondary | ICD-10-CM | POA: Diagnosis not present

## 2019-08-01 DIAGNOSIS — I714 Abdominal aortic aneurysm, without rupture: Secondary | ICD-10-CM | POA: Diagnosis not present

## 2019-08-01 DIAGNOSIS — I482 Chronic atrial fibrillation, unspecified: Secondary | ICD-10-CM | POA: Diagnosis not present

## 2019-08-22 ENCOUNTER — Other Ambulatory Visit: Payer: Self-pay | Admitting: Cardiology

## 2019-08-24 DIAGNOSIS — H353223 Exudative age-related macular degeneration, left eye, with inactive scar: Secondary | ICD-10-CM | POA: Diagnosis not present

## 2019-08-29 DIAGNOSIS — I5032 Chronic diastolic (congestive) heart failure: Secondary | ICD-10-CM | POA: Diagnosis not present

## 2019-08-29 DIAGNOSIS — I10 Essential (primary) hypertension: Secondary | ICD-10-CM | POA: Diagnosis not present

## 2019-08-29 DIAGNOSIS — E7800 Pure hypercholesterolemia, unspecified: Secondary | ICD-10-CM | POA: Diagnosis not present

## 2019-09-07 DIAGNOSIS — Z85828 Personal history of other malignant neoplasm of skin: Secondary | ICD-10-CM | POA: Diagnosis not present

## 2019-09-07 DIAGNOSIS — D225 Melanocytic nevi of trunk: Secondary | ICD-10-CM | POA: Diagnosis not present

## 2019-09-07 DIAGNOSIS — L821 Other seborrheic keratosis: Secondary | ICD-10-CM | POA: Diagnosis not present

## 2019-09-07 DIAGNOSIS — R233 Spontaneous ecchymoses: Secondary | ICD-10-CM | POA: Diagnosis not present

## 2019-09-07 DIAGNOSIS — D1801 Hemangioma of skin and subcutaneous tissue: Secondary | ICD-10-CM | POA: Diagnosis not present

## 2019-09-07 DIAGNOSIS — D485 Neoplasm of uncertain behavior of skin: Secondary | ICD-10-CM | POA: Diagnosis not present

## 2019-09-14 ENCOUNTER — Other Ambulatory Visit: Payer: Self-pay

## 2019-09-14 DIAGNOSIS — R3912 Poor urinary stream: Secondary | ICD-10-CM | POA: Diagnosis not present

## 2019-09-14 DIAGNOSIS — N312 Flaccid neuropathic bladder, not elsewhere classified: Secondary | ICD-10-CM | POA: Diagnosis not present

## 2019-09-14 DIAGNOSIS — I714 Abdominal aortic aneurysm, without rupture, unspecified: Secondary | ICD-10-CM

## 2019-09-14 DIAGNOSIS — N401 Enlarged prostate with lower urinary tract symptoms: Secondary | ICD-10-CM | POA: Diagnosis not present

## 2019-09-28 DIAGNOSIS — H353223 Exudative age-related macular degeneration, left eye, with inactive scar: Secondary | ICD-10-CM | POA: Diagnosis not present

## 2019-09-29 DIAGNOSIS — E7800 Pure hypercholesterolemia, unspecified: Secondary | ICD-10-CM | POA: Diagnosis not present

## 2019-09-29 DIAGNOSIS — I5032 Chronic diastolic (congestive) heart failure: Secondary | ICD-10-CM | POA: Diagnosis not present

## 2019-09-29 DIAGNOSIS — I10 Essential (primary) hypertension: Secondary | ICD-10-CM | POA: Diagnosis not present

## 2019-10-10 ENCOUNTER — Ambulatory Visit
Admission: RE | Admit: 2019-10-10 | Discharge: 2019-10-10 | Disposition: A | Discharge status: Home / Self Care | Payer: PPO | Source: Ambulatory Visit | Attending: Vascular Surgery | Admitting: Vascular Surgery

## 2019-10-10 DIAGNOSIS — I714 Abdominal aortic aneurysm, without rupture, unspecified: Secondary | ICD-10-CM

## 2019-10-10 DIAGNOSIS — K5730 Diverticulosis of large intestine without perforation or abscess without bleeding: Secondary | ICD-10-CM | POA: Diagnosis not present

## 2019-10-10 DIAGNOSIS — I700 Atherosclerosis of aorta: Secondary | ICD-10-CM | POA: Diagnosis not present

## 2019-10-10 DIAGNOSIS — I513 Intracardiac thrombosis, not elsewhere classified: Secondary | ICD-10-CM | POA: Diagnosis not present

## 2019-10-10 MED ORDER — IOPAMIDOL (ISOVUE-370) INJECTION 76%
75.0000 mL | Freq: Once | INTRAVENOUS | Status: AC | PRN
  Administered 2019-10-10: 60 mL via INTRAVENOUS

## 2019-10-11 ENCOUNTER — Other Ambulatory Visit: Payer: Self-pay

## 2019-10-11 ENCOUNTER — Encounter: Payer: Self-pay | Admitting: Vascular Surgery

## 2019-10-11 ENCOUNTER — Ambulatory Visit: Payer: PPO | Admitting: Vascular Surgery

## 2019-10-11 VITALS — BP 94/57 | HR 75 | Temp 98.0°F | Resp 20 | Ht 71.0 in | Wt 177.0 lb

## 2019-10-11 DIAGNOSIS — I714 Abdominal aortic aneurysm, without rupture, unspecified: Secondary | ICD-10-CM

## 2019-10-11 NOTE — Progress Notes (Signed)
Vascular and Vein Specialist of [facility]  Patient name: [name] MRN: [id number] DOB: 1931/10/14 Sex: male  REASON FOR VISIT: Follow-up known abdominal aortic aneurysm  HPI: [name] is a 86 y.o. male here today for follow-up.  Has known infrarenal abdominal aortic aneurysm which is been followed for years.  Most recently by ultrasound he did have some expansion and was scheduled for CT scan for better visualization and further discussion.  He remains quite healthy and active at his age of 22.  He has no symptoms referable to his aneurysm  Past Medical History:  Diagnosis Date   Abdominal aortic aneurysm ([place]) 03/2019    Mid aortic large diameter 5.0 cm.  Has increased compared to 4.6- 4.8 on prior study   Arthritis    CAD S/P percutaneous coronary angioplasty 02/13/2013   PROXIMAL-MID LAD 95-99% AND 80% TANDEM LESIONS - PROMUS PREMIER DES 2.75 MM X 24 MM (2.9 MM)   Macular degeneration of left eye    Paroxysmal (persistent) atrial fibrillation ([place]) 02/13/2013   Initially was peri-infarction at time of anterior MI; no recurrence from 2014 until January 2021.  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 No pertinent family history.  SOCIAL HISTORY: Social History   Tobacco Use   Smoking status: Former Smoker    Packs/day: 1.00    Years: 10.00    Pack years: 10.00    Types: Cigarettes    Quit date: 06/07/1983    Years since quitting: 36.3   Smokeless tobacco: Never Used  Substance Use Topics   Alcohol use: Yes    Alcohol/week: 0.0 standard drinks    Comment: social    Allergies  Allergen Reactions   Ciprofloxacin Diarrhea and Nausea Only    Current Outpatient Medications  Medication Sig Dispense  Refill   alfuzosin (UROXATRAL) 10 MG 24 hr tablet Take 10 mg by mouth daily.  10   apixaban (ELIQUIS) 5 MG TABS tablet Take 1 tablet (5 mg total) by mouth 2 (two) times daily. 180 tablet 1   atorvastatin (LIPITOR) 20 MG tablet Take 20 mg by mouth daily at 6 PM.   10   diclofenac Sodium (VOLTAREN) 1 % GEL Apply topically 4 (four) times daily.     diltiazem (CARDIZEM CD) 180 MG 24 hr capsule TAKE ONE CAPSULE BY MOUTH EVERY MORNING 30 capsule 3   dutasteride (AVODART) 0.5 MG capsule Take 0.5 mg by mouth daily.   3   furosemide (LASIX) 40 MG tablet Take 1 tablet (40 mg total) by mouth daily. or as ditercted 30 tablet 3   isosorbide mononitrate (IMDUR) 30 MG 24 hr tablet TAKE ONE TABLET BY MOUTH EVERY MORNING 30 tablet 3   levocetirizine (XYZAL) 5 MG tablet Take 5 mg by mouth every evening.   5   montelukast (SINGULAIR) 10 MG tablet Take 10 mg by mouth daily.     Multiple Vitamins-Minerals (ICAPS AREDS 2 PO) Take 1 tablet by mouth daily.      nitroGLYCERIN (NITROSTAT) 0.4 MG SL tablet PLACE 1 TABLET UNDER THE TONGUE EVERY 5 MINUTES AS NEEDED FOR CHEST PAIN (Patient taking differently: Place 0.4 mg under the tongue every 5 (five) minutes as needed for chest pain. ) 75  tablet 1   potassium chloride SA (KLOR-CON) 20 MEQ tablet Take 1 tablet (20 mEq total) by mouth daily. with lasix 30 tablet 3   Coenzyme Q10 (Q-10 CO-ENZYME PO) Take 100 mg by mouth daily.  (Patient not taking: Reported on 10/11/2019)     fluticasone (FLONASE) 50 MCG/ACT nasal spray Place 2 sprays into both nostrils daily as needed for allergies.  (Patient not taking: Reported on 10/11/2019)  5   primidone (MYSOLINE) 50 MG tablet Take 50 mg by mouth 2 (two) times daily.  (Patient not taking: Reported on 10/11/2019)     vitamin C (ASCORBIC ACID) 500 MG tablet Take 500 mg by mouth daily. (Patient not taking: Reported on 10/11/2019)     No current facility-administered medications for this visit.    REVIEW OF SYSTEMS:  [X]   denotes positive finding, [ ]  denotes negative finding Cardiac  Comments:  Chest pain or chest pressure:    Shortness of breath upon exertion:    Short of breath when lying flat:    Irregular heart rhythm:        Vascular    Pain in calf, thigh, or hip brought on by ambulation:    Pain in feet at night that wakes you up from your sleep:     Blood clot in your veins:    Leg swelling:           PHYSICAL EXAM: Vitals:   10/11/19 0853  BP: (!) 94/57  Pulse: 75  Resp: 20  Temp: 98 F (36.7 C)  SpO2: 95%  Weight: 177 lb (80.3 kg)  Height: 5\' 11"  (1.803 m)    GENERAL: The patient is a well-nourished male, in no acute distress. The vital signs are documented above. CARDIOVASCULAR: 2+ radial pulses bilaterally.  2+ femoral pulses and 2+ popliteal pulses with no evidence of peripheral aneurysm.  I do not feel an abdominal aortic aneurysm PULMONARY: There is good air exchange  MUSCULOSKELETAL: There are no major deformities or cyanosis. NEUROLOGIC: No focal weakness or paresthesias are detected. SKIN: There are no ulcers or rashes noted. PSYCHIATRIC: The patient has a normal affect.  DATA:  CT scan from 10/10/2019 reveals a maximal diameter of 5.0 cm.  This is infrarenal.  No evidence of other aneurysmal change.  MEDICAL ISSUES: I discussed these findings with the patient.  Explained that he has had very slow growth over time.  Explained that current recommendation is repair at 5.5 cm.  I did explain that his age of 52 would come into bearing if he does have continued expansion.  I again reviewed symptoms of leaking aneurysm.  We will see him again in 6 months with ultrasound.  He knows to report immediately should he have any symptoms    Cameron Posner, MD [facility] Vascular and Vein Specialists of [facility] Tel [phone] Pager [phone]

## 2019-10-13 DIAGNOSIS — N390 Urinary tract infection, site not specified: Secondary | ICD-10-CM | POA: Diagnosis not present

## 2019-10-16 ENCOUNTER — Other Ambulatory Visit: Payer: Self-pay | Admitting: Cardiology

## 2019-10-17 ENCOUNTER — Other Ambulatory Visit: Payer: Self-pay | Admitting: *Deleted

## 2019-10-17 DIAGNOSIS — I714 Abdominal aortic aneurysm, without rupture, unspecified: Secondary | ICD-10-CM

## 2019-10-19 DIAGNOSIS — H35453 Secondary pigmentary degeneration, bilateral: Secondary | ICD-10-CM | POA: Diagnosis not present

## 2019-10-19 DIAGNOSIS — H40002 Preglaucoma, unspecified, left eye: Secondary | ICD-10-CM | POA: Diagnosis not present

## 2019-10-19 DIAGNOSIS — H53412 Scotoma involving central area, left eye: Secondary | ICD-10-CM | POA: Diagnosis not present

## 2019-10-19 DIAGNOSIS — H353112 Nonexudative age-related macular degeneration, right eye, intermediate dry stage: Secondary | ICD-10-CM | POA: Diagnosis not present

## 2019-10-19 DIAGNOSIS — H35363 Drusen (degenerative) of macula, bilateral: Secondary | ICD-10-CM | POA: Diagnosis not present

## 2019-10-19 DIAGNOSIS — H353223 Exudative age-related macular degeneration, left eye, with inactive scar: Secondary | ICD-10-CM | POA: Diagnosis not present

## 2019-10-19 DIAGNOSIS — H31092 Other chorioretinal scars, left eye: Secondary | ICD-10-CM | POA: Diagnosis not present

## 2019-10-19 DIAGNOSIS — Z961 Presence of intraocular lens: Secondary | ICD-10-CM | POA: Diagnosis not present

## 2019-11-01 ENCOUNTER — Telehealth: Payer: Self-pay | Admitting: *Deleted

## 2019-11-01 DIAGNOSIS — L821 Other seborrheic keratosis: Secondary | ICD-10-CM | POA: Diagnosis not present

## 2019-11-01 DIAGNOSIS — Z85828 Personal history of other malignant neoplasm of skin: Secondary | ICD-10-CM | POA: Diagnosis not present

## 2019-11-01 DIAGNOSIS — D485 Neoplasm of uncertain behavior of skin: Secondary | ICD-10-CM | POA: Diagnosis not present

## 2019-11-01 NOTE — Telephone Encounter (Signed)
A message was left, re: his follow up visit. 

## 2019-11-02 ENCOUNTER — Other Ambulatory Visit: Payer: Self-pay

## 2019-11-02 ENCOUNTER — Encounter: Payer: Self-pay | Admitting: Podiatry

## 2019-11-02 ENCOUNTER — Ambulatory Visit: Payer: PPO | Admitting: Podiatry

## 2019-11-02 DIAGNOSIS — B351 Tinea unguium: Secondary | ICD-10-CM

## 2019-11-02 DIAGNOSIS — M79676 Pain in unspecified toe(s): Secondary | ICD-10-CM | POA: Diagnosis not present

## 2019-11-02 DIAGNOSIS — D689 Coagulation defect, unspecified: Secondary | ICD-10-CM | POA: Diagnosis not present

## 2019-11-02 NOTE — Progress Notes (Signed)
This patient returns to my office for at risk foot care.  This patient requires this care by a professional since this patient will be at risk due to having coagulation defect.  Patient is taking eliquiss.  This patient is unable to cut nails himself since the patient cannot reach his nails.These nails are painful walking and wearing shoes.  This patient presents for at risk foot care today.   Patient is taking eliquiss.  General Appearance  Alert, conversant and in no acute stress.  Vascular  Dorsalis pedis and posterior tibial  pulses are palpable  bilaterally.  Capillary return is within normal limits  bilaterally. Temperature is within normal limits  bilaterally.  Neurologic  Senn-Weinstein monofilament wire test within normal limits  bilaterally. Muscle power within normal limits bilaterally.  Nails Thick disfigured discolored nails with subungual debris  from hallux to fifth toes bilaterally. No evidence of bacterial infection or drainage bilaterally.  Orthopedic  No limitations of motion  feet .  No crepitus or effusions noted.  No bony pathology or digital deformities noted.  DJD 1st MPJ  B/L.  Skin  normotropic skin with no porokeratosis noted bilaterally.  No signs of infections or ulcers noted.     Onychomycosis  Pain in right toes  Pain in left toes  Consent was obtained for treatment procedures.   Mechanical debridement of nails 1-5  bilaterally performed with a nail nipper.  Filed with dremel without incident.    Return office visit     4 months                Told patient to return for periodic foot care and evaluation due to potential at risk complications.   Teralyn Mullins DPM  

## 2019-11-03 ENCOUNTER — Telehealth: Payer: Self-pay | Admitting: Cardiology

## 2019-11-03 NOTE — Telephone Encounter (Signed)
Spoke to to who report for the past 3 days his BP has been running low. Readings listed below. Pt report occasional dizziness when standing but voiced it quickly resolves.  8/10-84/48 8/11-104/51          101/50  8/12- 115/70  Pt voiced he is concerned as his BP normally average around 115-120/68-70. Will route to MD for recommendations.

## 2019-11-03 NOTE — Telephone Encounter (Signed)
Pt c/o BP issue: STAT if pt c/o blurred vision, one-sided weakness or slurred speech  1. What are your last 5 BP readings? Wednesday 98/48, highest this week was 105/52, 104/51 today   2. Are you having any other symptoms (ex. Dizziness, headache, blurred vision, passed out)? some lightheadedness when getting up, but goes away quickly  3. What is your BP issue? Patient states since Wednesday his BP has been low. He states his BP is normally around 118/70. Please advise.

## 2019-11-04 DIAGNOSIS — H353223 Exudative age-related macular degeneration, left eye, with inactive scar: Secondary | ICD-10-CM | POA: Diagnosis not present

## 2019-11-04 NOTE — Telephone Encounter (Signed)
Make sure that he is not taking Imdur and diltiazem the same time. -->  Maybe we can have him take diltiazem at dinnertime and Imdur in the morning.   When his pressures is in the 80s, recommendation is hydrate and eat something. ->  If he has a dose of diltiazem or Imdur coming up, hold it that day.  If this is now his new trend of blood pressures, we should reduce his diltiazem dose to 120 mg daily  Glenetta Hew, MD

## 2019-11-07 NOTE — Telephone Encounter (Signed)
Left message to call back  

## 2019-11-08 MED ORDER — DILTIAZEM HCL ER COATED BEADS 120 MG PO CP24: ORAL_CAPSULE | ORAL | 3 refills | Status: DC

## 2019-11-08 NOTE — Telephone Encounter (Signed)
I am curious to see what his blood pressures look like when taking the diltiazem at night.  Hopefully this will help his daytime blood pressures if not, we will have to reduce the dose.  Glenetta Hew, MD

## 2019-11-08 NOTE — Telephone Encounter (Signed)
Spoke to patient he stated his medications come pre packaged he does not know them by name he just knows the colors.Called and spoke to pharmacist at [facility] where medications are packaged.He stated Imdur 30 mg is packaged for am and Diltiazem 180 mg is packaged for am.Pharmacist will change Diltiazem to 120 mg in pm.He will receive new medication package in 2 weeks. Spoke to patient he stated he has not been taking Imdur.Stated he was told several months ago to stop to see if causing tingling in feet. He will start taking again in 2 weeks when he receives new medication package.He will monitor his B/P daily and bring readings to appointment with [place] 9/7 at 2:00 pm.

## 2019-11-08 NOTE — Telephone Encounter (Signed)
Patient is returning call.  °

## 2019-11-21 ENCOUNTER — Other Ambulatory Visit: Payer: Self-pay | Admitting: Cardiology

## 2019-11-21 DIAGNOSIS — I10 Essential (primary) hypertension: Secondary | ICD-10-CM | POA: Diagnosis not present

## 2019-11-21 DIAGNOSIS — E7800 Pure hypercholesterolemia, unspecified: Secondary | ICD-10-CM | POA: Diagnosis not present

## 2019-11-21 DIAGNOSIS — I5032 Chronic diastolic (congestive) heart failure: Secondary | ICD-10-CM | POA: Diagnosis not present

## 2019-11-29 ENCOUNTER — Ambulatory Visit (INDEPENDENT_AMBULATORY_CARE_PROVIDER_SITE_OTHER): Payer: PPO | Admitting: Cardiology

## 2019-11-29 ENCOUNTER — Encounter: Payer: Self-pay | Admitting: Cardiology

## 2019-11-29 ENCOUNTER — Other Ambulatory Visit: Payer: Self-pay

## 2019-11-29 VITALS — BP 98/68 | HR 66 | Ht 71.0 in | Wt 178.0 lb

## 2019-11-29 DIAGNOSIS — I4819 Other persistent atrial fibrillation: Secondary | ICD-10-CM

## 2019-11-29 DIAGNOSIS — I714 Abdominal aortic aneurysm, without rupture, unspecified: Secondary | ICD-10-CM

## 2019-11-29 DIAGNOSIS — I2510 Atherosclerotic heart disease of native coronary artery without angina pectoris: Secondary | ICD-10-CM

## 2019-11-29 DIAGNOSIS — Z9861 Coronary angioplasty status: Secondary | ICD-10-CM

## 2019-11-29 DIAGNOSIS — I5032 Chronic diastolic (congestive) heart failure: Secondary | ICD-10-CM

## 2019-11-29 DIAGNOSIS — I25119 Atherosclerotic heart disease of native coronary artery with unspecified angina pectoris: Secondary | ICD-10-CM | POA: Diagnosis not present

## 2019-11-29 DIAGNOSIS — E785 Hyperlipidemia, unspecified: Secondary | ICD-10-CM

## 2019-11-29 MED ORDER — FUROSEMIDE 40 MG PO TABS: ORAL_TABLET | ORAL | 2 refills | Status: DC

## 2019-11-29 NOTE — Progress Notes (Signed)
Primary Care Provider: Lajean Manes, MD Cardiologist: Glenetta Hew, MD Electrophysiologist: None  Clinic Note: Chief Complaint  Patient presents with  . Follow-up    Feeling pretty well, just some dizziness and low blood pressures.  . Coronary Artery Disease    No angina  . Atrial Fibrillation    As far as he can tell, no recurrent symptoms.    HPI:    [name] is a 86 y.o. male with a PMH notable for CAD, AAA (see below) & recurrence of PERSISTENT ATRIAL FIBRILLATION with [place] who presents for for him 63-month follow-up  CARDIAC HISTORY  History of anteriorSTEMIin November 2014 - with VT: PROXIMAL-MID LAD 95-99% AND 80% TANDEM LESIONS -- PCI w/ PROMUS PREMIER DES 2.75 MM X 24 MM (2.9 MM)   STEMI complicated by Afib and V. tach- no further recurrence until January 2021  Initial mild ICM - resolved with EF 50-55% (06/2014)  AAA - followed by Julianne Handler  Has been intolerant of beta-blocker because of fatigue and hypotension, also no ARB because of hypotension.  Recurrent ATRIAL FIBRILLATION -initially found during hospitalization April 14, 2019 -> admitted for chest pain, found to have A. fib (Echocardiogram and Myoview ordered, both relatively normal).  Started on apixaban but no rate control.  Clinic-May 04, 2019-started diltiazem 60 mg 3 times daily  Clinic-May 10, 2019 -noted improved heart rates, but more significant edema and exertional dyspnea, orthopnea and PND ->  Started on Lasix 40 mg twice daily for 2 days and then reduce to once daily.  Close follow-up by Kerin Ransom, PA (on February 22) => noted 7 lb weight loss since starting Lasix.  Edema and shortness of breath improved.  A. fib stable.  Intermittent chest pain but not exertional.  Low-dose Imdur initiated.  Has been troubled by gout now.  [name] was last seen on June 29 2019: Noted that the symptoms were significantly improved.  No angina, edema improved PND  and orthopnea notably improved.  Energy better.  He had one episode of feeling lightheaded dizzy, but otherwise did fine.  Energy level notably improved.  Sleeping on one pillow.  If recurrent dizziness occurred, would order Event Monitor to evaluate for bradycardia or pauses.  Plan was to consider cardioversion if he has further breakthrough with possible need for antiarrhythmic agent.  Recent Hospitalizations:   N/A   On August 12, he called in about low blood pressures.  Plan was to reduce diltiazem 120 mg daily and have it split up from the timing of when he takes Imdur.  (Unfortunately that was not changed)   Reviewed  CV studies:    The following studies were reviewed today: (if available, images/films reviewed: From Epic Chart or Care Everywhere) . n/a   Interval History:   [name] presents here today doing fairly well.  No more chest pain although he has had some more musculoskeletal type twinging type symptoms across his ribs but not with exertion.  He is still has some dizzy spells but more orthostatic type symptoms with a little less energy.  It is not had any recurrent symptoms of irregular heartbeats or palpitations.  He has not had the spells of near syncope that he had had before.  His edema is well improved with no PND orthopnea.  No bleeding on Eliquis.  Back to baseline level of exertion.    He has she tells me today his blood pressure is a little low  for him.  Usually range of 110/60-119/63 mmHg.  Today is a somewhat different episode.  Things improved when he split up taking the Imdur and diltiazem.  CV Review of Symptoms (Summary) Cardiovascular ROS: positive for - Orthostatic dizziness, notably improved fatigue, but just not fully back to normal. negative for - chest pain, dyspnea on exertion, edema, irregular heartbeat, orthopnea, palpitations, paroxysmal nocturnal dyspnea, rapid heart rate, shortness of breath or No more symptoms concerning for near  syncope or syncope.  Only orthostatic dizziness.  No TIA or amaurosis fugax.  No claudication.  The patient Does Not have symptoms concerning for COVID-19 infection (fever, chills, cough, or new shortness of breath).  The patient is practicing social distancing & Masking.   He has completed his 2 COVID-19 vaccine injections   REVIEWED OF SYSTEMS   A comprehensive ROS was performed. Review of Systems  Constitutional: Negative for malaise/fatigue (Notably improved over the last 3 weeks) and weight loss.  HENT: Negative for nosebleeds.   Respiratory: Negative for shortness of breath and wheezing (Wife notes that he is wheezing at night).   Cardiovascular: Negative for orthopnea (Back to sleeping on 1 pillow), leg swelling and PND.  Gastrointestinal: Negative for blood in stool and melena.  Genitourinary: Negative for hematuria.  Musculoskeletal: Positive for joint pain. Negative for falls.  Neurological: Negative for dizziness and weakness (Feels much better now.  He had a brief spell of weakness and dizziness 3 weeks ago, and the next day he felt much better.).  Psychiatric/Behavioral: Negative for memory loss. The patient is not nervous/anxious and does not have insomnia.    I have reviewed and (if needed) personally updated the patient's problem list, medications, allergies, past medical and surgical history, social and family history.   PAST MEDICAL HISTORY   Past Medical History:  Diagnosis Date  . Abdominal aortic aneurysm ([place]) 03/2019    Mid aortic large diameter 5.0 cm.  Has increased compared to 4.6- 4.8 on prior study  . Arthritis   . CAD S/P percutaneous coronary angioplasty 02/13/2013   PROXIMAL-MID LAD 95-99% AND 80% TANDEM LESIONS - PROMUS PREMIER DES 2.75 MM X 24 MM (2.9 MM)  . Macular degeneration of left eye   . Paroxysmal (persistent) atrial fibrillation ([place]) 02/13/2013   Initially was peri-infarction at time of anterior MI; no recurrence from 2014 until January  2021.  Now persistent  . Prostate disease   . 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 ST elevation myocardial infarction (STEMI) of anterior wall, initial episode of care ([place]) 02/10/2013  . Tremors of nervous system    both hands-under control    PAST SURGICAL HISTORY   Past Surgical History:  Procedure Laterality Date  . AAA DUPLEX  03/2019   AAA DUPLEX June 2020: Largest Ao Ddiameter unchanged from Oct 2019 -> ~4.6 cm; January 21: Mid aortic large diameter 5.0 cm.  Has increased compared to 4.6- 4.8 on prior study.    . APPENDECTOMY  age 43   . CYSTOSCOPY W/ RETROGRADES Bilateral 02/03/2014   Procedure:  BILATERAL RETROGRADE PYELOGRAM;  Surgeon: Festus Aloe, MD;  Location: [facility];  Service: Urology;  Laterality: Bilateral;  . GREEN LIGHT LASER TURP (TRANSURETHRAL RESECTION OF PROSTATE N/A 02/03/2014   Procedure: GREEN LIGHT LASER TURP (TRANSURETHRAL RESECTION OF PROSTATE PHOTO VAPORIZATION;  Surgeon: Festus Aloe, MD;  Location: [facility];  Service: Urology;  Laterality: N/A;  . HERNIA REPAIR Left 2000  . LEFT HEART CATHETERIZATION WITH CORONARY ANGIOGRAM  N/A 02/10/2013   Procedure: LEFT HEART CATHETERIZATION WITH CORONARY ANGIOGRAM;  Surgeon: Leonie Man, MD;  Location: [facility] CATH LAB;  Service: Cardiovascular;  Laterality: N/A;  . NM MYOVIEW LTD  03/2019   Performed to evaluate A. fib: Baseline A. fib.  No ST segment changes.  EF estimated 49% with normal wall motion, but gating was limited because of A. fib.  Small size, mild severity mostly fixed apical defect suggestive of small prior infarct with mild peri-infarct ischemia.  LOW RISK.  No evidence of large or high risk ischemia present.  (Likely consistent with prior infarct)  . PERCUTANEOUS CORONARY STENT INTERVENTION (PCI-S)  02/10/13   PROXIMAL-MID LAD 95-99% AND 80% TANDEM LESIONS - PROMUS PREMIER DES 2.75 MM X 24 MM (2.9 MM)  . shot in left eye 03/2019 for macular degeneration    . TRANSTHORACIC ECHOCARDIOGRAM   03/2019   Normal LVEF 50 to 55%.  No R WMA.  Unable to assess diastolic function because of A. fib.  Mild LA dilation.  Mild aortic sclerosis without stenosis    MEDICATIONS/ALLERGIES   Current Meds  Medication Sig  . alfuzosin (UROXATRAL) 10 MG 24 hr tablet Take 10 mg by mouth daily.  Marland Kitchen atorvastatin (LIPITOR) 10 MG tablet Take 10 mg by mouth daily at 6 PM.   . diclofenac Sodium (VOLTAREN) 1 % GEL Apply topically 4 (four) times daily.  Marland Kitchen dutasteride (AVODART) 0.5 MG capsule Take 0.5 mg by mouth daily.   Marland Kitchen ELIQUIS 5 MG TABS tablet TAKE ONE TABLET BY MOUTH AT BREAKFAST AND AT BEDTIME  . fluticasone (FLONASE) 50 MCG/ACT nasal spray Place 2 sprays into both nostrils daily as needed for allergies.   . furosemide (LASIX) 40 MG tablet MAY TAKE 20 TO 40 MG TABLETS AS NEEDED FOR  SWELLING  . Glucosamine HCl (GLUCOSAMINE PO) Take 1,500 mg by mouth 3 (three) times daily.  Marland Kitchen KLOR-CON M20 20 MEQ tablet TAKE 1 TABLET BY MOUTH WITH LASIX AS NEEDED  . levocetirizine (XYZAL) 5 MG tablet Take 5 mg by mouth every evening.   . montelukast (SINGULAIR) 10 MG tablet Take 10 mg by mouth daily.  . Multiple Vitamins-Minerals (ICAPS AREDS 2 PO) Take 1 tablet by mouth daily.   . nitroGLYCERIN (NITROSTAT) 0.4 MG SL tablet PLACE 1 TABLET UNDER THE TONGUE EVERY 5 MINUTES AS NEEDED FOR CHEST PAIN (Patient taking differently: Place 0.4 mg under the tongue every 5 (five) minutes as needed for chest pain. )  . [DISCONTINUED] diltiazem (TIAZAC) 180 MG 24 hr capsule Take 180 mg by mouth daily.  . [DISCONTINUED] furosemide (LASIX) 40 MG tablet TAKE 1 TABLET (40 MG TOTAL) BY MOUTH 2 (TWO) TIMES DAILY. OR AS DITERCTED  . [DISCONTINUED] isosorbide mononitrate (IMDUR) 30 MG 24 hr tablet TAKE ONE TABLET BY MOUTH EVERY MORNING    Allergies  Allergen Reactions  . Ciprofloxacin Diarrhea and Nausea Only    SOCIAL HISTORY/FAMILY HISTORY   Social History   Tobacco Use  . Smoking status: Former Smoker    Packs/day: 1.00     Years: 10.00    Pack years: 10.00    Types: Cigarettes    Quit date: 06/07/1983    Years since quitting: 36.5  . Smokeless tobacco: Never Used  Vaping Use  . Vaping Use: Never used  Substance Use Topics  . Alcohol use: Yes    Alcohol/week: 0.0 standard drinks    Comment: social  . Drug use: No   Social History   Social History Narrative  He is married.     Home - lives with wife; NOK: Wife, [name], [phone]      Retired.   Quit smoking in 1985. Does not drink alcohol.    Family History Family history is unknown by patient.  OBJCTIVE -PE, EKG, labs   Wt Readings from Last 3 Encounters:  11/29/19 178 lb (80.7 kg)  10/11/19 177 lb (80.3 kg)  06/29/19 180 lb 6.4 oz (81.8 kg)    Physical Exam: BP 98/68   Pulse 66   Ht 5\' 11"  (1.803 m)   Wt 178 lb (80.7 kg)   SpO2 97%   BMI 24.83 kg/m  Physical Exam Vitals reviewed.  Constitutional:      General: He is not in acute distress.    Appearance: He is well-developed.     Comments: Healthy-appearing elderly gentleman.  Well-groomed.  Notably improved symptomatically.  HENT:     Head: Normocephalic and atraumatic.  Neck:     Vascular: No carotid bruit (Likely radiated aortic murmur), hepatojugular reflux or JVD (Less pulsatile with no cannon A waves, but roughly 8-9 cmH2O).  Cardiovascular:     Rate and Rhythm: Normal rate and regular rhythm.  No extrasystoles are present.    Chest Wall: PMI is not displaced.     Pulses: Intact distal pulses. Decreased pulses: Diminished pedal pulses secondary to edema.     Heart sounds: S1 normal and S2 normal. Heart sounds are distant. Murmur (1/6 SEM at RUSB --> carotids) heard.  No friction rub. No gallop.   Pulmonary:     Effort: Pulmonary effort is normal. No respiratory distress.     Breath sounds: No wheezing or rales.  Abdominal:     General: Bowel sounds are normal.     Palpations: Abdomen is soft.  Musculoskeletal:        General: No swelling. Normal range of  motion.     Cervical back: Normal range of motion and neck supple.  Neurological:     General: No focal deficit present.     Mental Status: He is alert and oriented to person, place, and time.  Psychiatric:        Mood and Affect: Mood normal.        Behavior: Behavior normal.        Thought Content: Thought content normal.        Judgment: Judgment normal.     Adult ECG Report  Rate: 75;  Rhythm: normal sinus rhythm and Nonspecific ST-T wave changes.;   Narrative Interpretation when compared to last EKG, now in sinus rhythm.  Recent Labs:   June 2020-TC 106, TG 106, HDL 34, LDL 51.  A1c 5.7. -->    Aug 01, 2019: TC 109, TG 85, HDL 38, LDL 55. C 1.21, K+ 4.3.  Lab Results  Component Value Date   CREATININE 1.15 05/16/2019   BUN 20 05/16/2019   NA 137 05/16/2019   K 4.6 05/16/2019   CL 99 05/16/2019   CO2 24 05/16/2019  Apixaban  Lab Results  Component Value Date   TSH 3.255 04/14/2019    ASSESSMENT/PLAN    Problem List Items Addressed This Visit    CAD S/P Prox-Mid LAD; Promus DES 02/10/13 (Chronic)    Now distant history of PCI to the LAD.  No further angina.  No longer on antiplatelet agent as it was exchanged for Eliquis.      Relevant Medications   furosemide (LASIX) 40 MG tablet   Persistent atrial fibrillation (  HCC)-CHA2DS2-VASc score 3-4.  On Eliquis - Primary (Chronic)    Patient was relatively symptomatic when he was in A. fib, therefore I am pretty certain that he has not had any breakthrough spells.  Remains on Eliquis for anticoagulation with no bleeding issues.  My plan had been to reduce the diltiazem XT to 120 mg daily, but this apparently did not get ordered.  With him having hypotension today I actually plan to stop Imdur and continue current dose of diltiazem XT 180 mg.  If he has continued hypotension, we will drop the dose 120 mg daily.  If he has breakthrough episodes plan will be to schedule for DCCV and then potentially add antiarrhythmic  agent.  In that event, would probably refer him to the [facility].  If he has recurrence of the weekend, I recommend that he goes to the emergency room at [facility] for cardioversion if extremely symptomatic, otherwise in light of the ongoing Covid surge, would probably recommend contacting the office to schedule outpatient cardioversion      Relevant Medications   furosemide (LASIX) 40 MG tablet   Other Relevant Orders   EKG 12-Lead (Completed)   Chronic diastolic CHF (congestive heart failure), NYHA class 2 (HCC) (Chronic)    I suspect that he got symptomatic with diastolic function associated with his A. fib earlier this year.  But now seems to be more stable.  I think we probably backed off on his Lasix and use it more as an as-needed basis.  If he has worsening exacerbation he will double up doses like in the past.  This will hopefully allow for better blood pressure and less orthostatic symptoms.  He has frequent low blood pressures and therefore no room for afterload reduction.      Relevant Medications   furosemide (LASIX) 40 MG tablet   Coronary artery disease involving native coronary artery of native heart with angina pectoris (HCC) (Chronic)    Thankfully, no further angina with Imdur and diltiazem.  Had nonischemic Myoview.  At this point with a man hypotension likely should be to stop Imdur and see how he does. Continue current dose of atorvastatin.      Relevant Medications   furosemide (LASIX) 40 MG tablet   Other Relevant Orders   EKG 12-Lead (Completed)   Hyperlipidemia with target LDL less than 70 (Chronic)    Follow-up lipids as impressive as last years'.  Continue current low-dose atorvastatin.  Tolerating it well with no side effects.       Relevant Medications   furosemide (LASIX) 40 MG tablet   AAA (abdominal aortic aneurysm)-5.0 cm by CT scan 09/2019 (Chronic)    Being followed by Dr. Donnetta Hutching from vascular surgery.  Per his note:  CT  scan from 10/10/2019 reveals a maximal diameter of 5.0 cm in the infrarenal aorta.  No evidence of other aneurysmal change.  Dr. Donnetta Hutching Explained that current recommendation is repair at 5.5 cm Concerning symptoms reviewed.  Plan is 39-month follow-up after relook ultrasound.       Relevant Medications   furosemide (LASIX) 40 MG tablet       COVID-19 Education: The signs and symptoms of COVID-19 were discussed with the patient and how to seek care for testing (follow up with PCP or arrange E-visit).   The importance of social distancing was discussed today.  I spent a total of 18 minutes with the patient. >  50% of the time was spent in direct patient consultation.  Additional time spent with chart review  / charting (studies, outside notes, etc): 6 Total Time: 24 min  Current medicines are reviewed at length with the patient today.  (+/- concerns) n/a   Patient Instructions / Medication Changes & Studies & Tests Ordered   Patient Instructions  Medication Instructions:    CHANGE LASIX  TO USE AS NEEDED  STOP TAKING ISOSORBIDE MONO ( IMDUR)    *If you need a refill on your cardiac medications before your next appointment, please call your pharmacy*   Lab Work: NOT NEEDED   Testing/Procedures: NOT NEEDED   Follow-Up: At [facility], you and your health needs are our priority.  As part of our continuing mission to provide you with exceptional heart care, we have created designated Provider Care Teams.  These Care Teams include your primary Cardiologist (physician) and Advanced Practice Providers (APPs -  Physician Assistants and Nurse Practitioners) who all work together to provide you with the care you need, when you need it.  We recommend signing up for the patient portal called "MyChart".  Sign up information is provided on this After Visit Summary.  MyChart is used to connect with patients for Virtual Visits (Telemedicine).  Patients are able to view lab/test results,  encounter notes, upcoming appointments, etc.  Non-urgent messages can be sent to your provider as well.   To learn more about what you can do with MyChart, go to [email or web address].    Your next appointment:   6 month(s) MARCH 2022  The format for your next appointment:   In Person  Provider:   Glenetta Hew, MD   Other Instructions MONITOR  BLOOD PRESSURE  IF TOP NUMBER IS LESS THAN 110 CONSISTENTLY CONTACT OFFICE MAY DECREASE DILTIAZEM DOSE.     Studies Ordered:   Orders Placed This Encounter  Procedures  . EKG 12-Lead     Glenetta Hew, M.D., M.S. Interventional Cardiologist   Pager # [phone] Phone # [phone] [address]. [place], [place] [address]   Thank you for choosing Heartcare at [facility]!!

## 2019-11-29 NOTE — Patient Instructions (Addendum)
Medication Instructions:    CHANGE LASIX  TO USE AS NEEDED  STOP TAKING ISOSORBIDE MONO ( IMDUR)    *If you need a refill on your cardiac medications before your next appointment, please call your pharmacy*   Lab Work: NOT NEEDED   Testing/Procedures: NOT NEEDED   Follow-Up: At [facility], you and your health needs are our priority.  As part of our continuing mission to provide you with exceptional heart care, we have created designated Provider Care Teams.  These Care Teams include your primary Cardiologist (physician) and Advanced Practice Providers (APPs -  Physician Assistants and Nurse Practitioners) who all work together to provide you with the care you need, when you need it.  We recommend signing up for the patient portal called "MyChart".  Sign up information is provided on this After Visit Summary.  MyChart is used to connect with patients for Virtual Visits (Telemedicine).  Patients are able to view lab/test results, encounter notes, upcoming appointments, etc.  Non-urgent messages can be sent to your provider as well.   To learn more about what you can do with MyChart, go to [email or web address].    Your next appointment:   6 month(s) MARCH 2022  The format for your next appointment:   In Person  Provider:   Glenetta Hew, MD   Other Instructions MONITOR  BLOOD PRESSURE  IF TOP NUMBER IS LESS THAN 110 CONSISTENTLY CONTACT OFFICE MAY DECREASE DILTIAZEM DOSE.

## 2019-12-02 ENCOUNTER — Telehealth: Payer: Self-pay | Admitting: Cardiology

## 2019-12-02 ENCOUNTER — Other Ambulatory Visit: Payer: Self-pay

## 2019-12-02 MED ORDER — DILTIAZEM HCL ER BEADS 180 MG PO CP24: 180.0000 mg | ORAL_CAPSULE | Freq: Every day | ORAL | 1 refills | Status: DC

## 2019-12-02 NOTE — Telephone Encounter (Signed)
*  STAT* If patient is at the pharmacy, call can be transferred to refill team.   1. Which medications need to be refilled? (please list name of each medication and dose if known)   diltiazem (TIAZAC) 180 MG 24 hr capsule   2. Which pharmacy/location (including street and city if local pharmacy) is medication to be sent to? [facility] - [place], [place] - [address]  3. Do they need a 30 day or 90 day supply?  90 day  

## 2019-12-05 DIAGNOSIS — Z23 Encounter for immunization: Secondary | ICD-10-CM | POA: Diagnosis not present

## 2019-12-08 ENCOUNTER — Encounter: Payer: Self-pay | Admitting: Cardiology

## 2019-12-08 NOTE — Assessment & Plan Note (Signed)
I suspect that he got symptomatic with diastolic function associated with his A. fib earlier this year.  But now seems to be more stable.  I think we probably backed off on his Lasix and use it more as an as-needed basis.  If he has worsening exacerbation he will double up doses like in the past.  This will hopefully allow for better blood pressure and less orthostatic symptoms.  He has frequent low blood pressures and therefore no room for afterload reduction.

## 2019-12-08 NOTE — Assessment & Plan Note (Addendum)
Patient was relatively symptomatic when he was in A. fib, therefore I am pretty certain that he has not had any breakthrough spells.  Remains on Eliquis for anticoagulation with no bleeding issues.  My plan had been to reduce the diltiazem XT to 120 mg daily, but this apparently did not get ordered.  With him having hypotension today I actually plan to stop Imdur and continue current dose of diltiazem XT 180 mg.  If he has continued hypotension, we will drop the dose 120 mg daily.  If he has breakthrough episodes plan will be to schedule for DCCV and then potentially add antiarrhythmic agent.  In that event, would probably refer him to the [facility].  If he has recurrence of the weekend, I recommend that he goes to the emergency room at [facility] for cardioversion if extremely symptomatic, otherwise in light of the ongoing Covid surge, would probably recommend contacting the office to schedule outpatient cardioversion

## 2019-12-08 NOTE — Assessment & Plan Note (Signed)
Thankfully, no further angina with Imdur and diltiazem.  Had nonischemic Myoview.  At this point with a man hypotension likely should be to stop Imdur and see how he does. Continue current dose of atorvastatin.

## 2019-12-08 NOTE — Assessment & Plan Note (Signed)
Now distant history of PCI to the LAD.  No further angina.  No longer on antiplatelet agent as it was exchanged for Eliquis.

## 2019-12-08 NOTE — Assessment & Plan Note (Signed)
Being followed by Dr. Donnetta Hutching from vascular surgery.  Per his note:  CT scan from 10/10/2019 reveals a maximal diameter of 5.0 cm in the infrarenal aorta.  No evidence of other aneurysmal change.  Dr. Donnetta Hutching Explained that current recommendation is repair at 5.5 cm Concerning symptoms reviewed.  Plan is 27-month follow-up after relook ultrasound.

## 2019-12-08 NOTE — Assessment & Plan Note (Signed)
Follow-up lipids as impressive as last years'.  Continue current low-dose atorvastatin.  Tolerating it well with no side effects.

## 2019-12-14 DIAGNOSIS — H353223 Exudative age-related macular degeneration, left eye, with inactive scar: Secondary | ICD-10-CM | POA: Diagnosis not present

## 2019-12-16 DIAGNOSIS — I10 Essential (primary) hypertension: Secondary | ICD-10-CM | POA: Diagnosis not present

## 2019-12-16 DIAGNOSIS — I5032 Chronic diastolic (congestive) heart failure: Secondary | ICD-10-CM | POA: Diagnosis not present

## 2019-12-16 DIAGNOSIS — E7800 Pure hypercholesterolemia, unspecified: Secondary | ICD-10-CM | POA: Diagnosis not present

## 2020-01-19 DIAGNOSIS — I10 Essential (primary) hypertension: Secondary | ICD-10-CM | POA: Diagnosis not present

## 2020-01-19 DIAGNOSIS — E7800 Pure hypercholesterolemia, unspecified: Secondary | ICD-10-CM | POA: Diagnosis not present

## 2020-01-19 DIAGNOSIS — I5032 Chronic diastolic (congestive) heart failure: Secondary | ICD-10-CM | POA: Diagnosis not present

## 2020-01-30 DIAGNOSIS — H353223 Exudative age-related macular degeneration, left eye, with inactive scar: Secondary | ICD-10-CM | POA: Diagnosis not present

## 2020-02-01 ENCOUNTER — Telehealth (INDEPENDENT_AMBULATORY_CARE_PROVIDER_SITE_OTHER): Payer: Self-pay

## 2020-02-01 DIAGNOSIS — M25561 Pain in right knee: Secondary | ICD-10-CM | POA: Diagnosis not present

## 2020-02-01 DIAGNOSIS — D6869 Other thrombophilia: Secondary | ICD-10-CM | POA: Diagnosis not present

## 2020-02-01 DIAGNOSIS — I5032 Chronic diastolic (congestive) heart failure: Secondary | ICD-10-CM | POA: Diagnosis not present

## 2020-02-01 DIAGNOSIS — J309 Allergic rhinitis, unspecified: Secondary | ICD-10-CM | POA: Diagnosis not present

## 2020-02-01 DIAGNOSIS — M25562 Pain in left knee: Secondary | ICD-10-CM | POA: Diagnosis not present

## 2020-02-01 DIAGNOSIS — I482 Chronic atrial fibrillation, unspecified: Secondary | ICD-10-CM | POA: Diagnosis not present

## 2020-02-01 DIAGNOSIS — I10 Essential (primary) hypertension: Secondary | ICD-10-CM | POA: Diagnosis not present

## 2020-02-09 ENCOUNTER — Encounter (INDEPENDENT_AMBULATORY_CARE_PROVIDER_SITE_OTHER): Payer: Self-pay | Admitting: Otolaryngology

## 2020-02-09 ENCOUNTER — Ambulatory Visit (INDEPENDENT_AMBULATORY_CARE_PROVIDER_SITE_OTHER): Payer: PPO | Admitting: Otolaryngology

## 2020-02-09 ENCOUNTER — Other Ambulatory Visit: Payer: Self-pay

## 2020-02-09 VITALS — Temp 97.9°F

## 2020-02-09 DIAGNOSIS — H7291 Unspecified perforation of tympanic membrane, right ear: Secondary | ICD-10-CM | POA: Diagnosis not present

## 2020-02-09 DIAGNOSIS — H903 Sensorineural hearing loss, bilateral: Secondary | ICD-10-CM

## 2020-02-09 DIAGNOSIS — H6123 Impacted cerumen, bilateral: Secondary | ICD-10-CM

## 2020-02-09 NOTE — Progress Notes (Signed)
HPI: [name] is a 86 y.o. male who returns today for evaluation of wax buildup in his ears and extruded myringotomy tube in the right ear. He wears bilateral hearing aids. He had a right myringotomy tube placed in July 2020 because of recurrent serous otitis media. He wears bilateral hearing aids and has moderate bilateral hearing loss. He was seen recently and was told that the myringotomy tube is lying within the ear canal with a large amount of wax..  Past Medical History:  Diagnosis Date  . Abdominal aortic aneurysm ([place]) 03/2019    Mid aortic large diameter 5.0 cm.  Has increased compared to 4.6- 4.8 on prior study  . Arthritis   . CAD S/P percutaneous coronary angioplasty 02/13/2013   PROXIMAL-MID LAD 95-99% AND 80% TANDEM LESIONS - PROMUS PREMIER DES 2.75 MM X 24 MM (2.9 MM)  . Macular degeneration of left eye   . Paroxysmal (persistent) atrial fibrillation ([place]) 02/13/2013   Initially was peri-infarction at time of anterior MI; no recurrence from 2014 until January 2021.  Now persistent  . Prostate disease   . 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 ST elevation myocardial infarction (STEMI) of anterior wall, initial episode of care ([place]) 02/10/2013  . Tremors of nervous system    both hands-under control   Past Surgical History:  Procedure Laterality Date  . AAA DUPLEX  03/2019   AAA DUPLEX June 2020: Largest Ao Ddiameter unchanged from Oct 2019 -> ~4.6 cm; January 21: Mid aortic large diameter 5.0 cm.  Has increased compared to 4.6- 4.8 on prior study.    . APPENDECTOMY  age 13   . CYSTOSCOPY W/ RETROGRADES Bilateral 02/03/2014   Procedure:  BILATERAL RETROGRADE PYELOGRAM;  Surgeon: Festus Aloe, MD;  Location: [facility];  Service: Urology;  Laterality: Bilateral;  . GREEN LIGHT LASER TURP (TRANSURETHRAL RESECTION OF PROSTATE N/A 02/03/2014   Procedure: GREEN LIGHT LASER TURP (TRANSURETHRAL RESECTION OF PROSTATE PHOTO VAPORIZATION;  Surgeon: Festus Aloe, MD;   Location: [facility];  Service: Urology;  Laterality: N/A;  . HERNIA REPAIR Left 2000  . LEFT HEART CATHETERIZATION WITH CORONARY ANGIOGRAM N/A 02/10/2013   Procedure: LEFT HEART CATHETERIZATION WITH CORONARY ANGIOGRAM;  Surgeon: Leonie Man, MD;  Location[facility] C[facility] LAB;  Service: Cardiovascular;  Laterality: N/A;  . NM MYOVIEW LTD  03/2019   Performed to evaluate A. fib: Baseline A. fib.  No ST segment changes.  EF estimated 49% with normal wall motion, but gating was limited because of A. fib.  Small size, mild severity mostly fixed apical defect suggestive of small prior infarct with mild peri-infarct ischemia.  LOW RISK.  No evidence of large or high risk ischemia present.  (Likely consistent with prior infarct)  . PERCUTANEOUS CORONARY STENT INTERVENTION (PCI-S)  02/10/13   PROXIMAL-MID LAD 95-99% AND 80% TANDEM LESIONS - PROMUS PREMIER DES 2.75 MM X 24 MM (2.9 MM)  . shot in left eye 03/2019 for macular degeneration    . TRANSTHORACIC ECHOCARDIOGRAM  03/2019   Normal LVEF 50 to 55%.  No R WMA.  Unable to assess diastolic function because of A. fib.  Mild LA dilation.  Mild aortic sclerosis without stenosis   Social History   Socioeconomic History  . Marital status: Married    Spouse name: Earlie Server  . Number of children: Not on file  . Years of education: Not on file  . Highest education level: Not on file  Occupational History  .  Occupation: retired  Tobacco Use  . Smoking status: Former Smoker    Packs/day: 1.00    Years: 10.00    Pack years: 10.00    Types: Cigarettes    Quit date: 06/07/1983    Years since quitting: 36.7  . Smokeless tobacco: Never Used  Vaping Use  . Vaping Use: Never used  Substance and Sexual Activity  . Alcohol use: Yes    Alcohol/week: 0.0 standard drinks    Comment: social  . Drug use: No  . Sexual activity: Not on file  Other Topics Concern  . Not on file  Social History Narrative   He is married.     Home - lives with wife; NOK: Wife, Alexios Keown, [phone]      Retired.   Quit smoking in 1985. Does not drink alcohol.   Social Determinants of Health   Financial Resource Strain:   . Difficulty of Paying Living Expenses: Not on file  Food Insecurity:   . Worried About Charity fundraiser in the Last Year: Not on file  . Ran Out of Food in the Last Year: Not on file  Transportation Needs:   . Lack of Transportation (Medical): Not on file  . Lack of Transportation (Non-Medical): Not on file  Physical Activity:   . Days of Exercise per Week: Not on file  . Minutes of Exercise per Session: Not on file  Stress:   . Feeling of Stress : Not on file  Social Connections:   . Frequency of Communication with Friends and Family: Not on file  . Frequency of Social Gatherings with Friends and Family: Not on file  . Attends Religious Services: Not on file  . Active Member of Clubs or Organizations: Not on file  . Attends Archivist Meetings: Not on file  . Marital Status: Not on file   Family History  Family history unknown: Yes   Allergies  Allergen Reactions  . Ciprofloxacin Diarrhea and Nausea Only   Prior to Admission medications   Medication Sig Start Date End Date Taking? Authorizing Provider  alfuzosin (UROXATRAL) 10 MG 24 hr tablet Take 10 mg by mouth daily. 06/02/14  Yes [provider]  atorvastatin (LIPITOR) 10 MG tablet Take 10 mg by mouth daily at 6 PM.  09/19/15  Yes [provider]  diclofenac Sodium (VOLTAREN) 1 % GEL Apply topically 4 (four) times daily.   Yes [provider]  diltiazem (TIAZAC) 180 MG 24 hr capsule Take 1 capsule (180 mg total) by mouth daily. 12/02/19  Yes Leonie Man, MD  dutasteride (AVODART) 0.5 MG capsule Take 0.5 mg by mouth daily.  09/19/15  Yes [provider]  ELIQUIS 5 MG TABS tablet TAKE ONE TABLET BY MOUTH AT BREAKFAST AND AT BEDTIME 11/21/19  Yes Leonie Man, MD  fluticasone [facility]) 50 MCG/ACT nasal spray Place 2 sprays into  both nostrils daily as needed for allergies.  01/06/18  Yes [provider]  furosemide (LASIX) 40 MG tablet MAY TAKE 20 TO 40 MG TABLETS AS NEEDED FOR  SWELLING 11/29/19  Yes Leonie Man, MD  Glucosamine HCl (GLUCOSAMINE PO) Take 1,500 mg by mouth 3 (three) times daily.   Yes [provider]  KLOR-CON M20 20 MEQ tablet TAKE 1 TABLET BY MOUTH WITH LASIX AS NEEDED 10/17/19  Yes Leonie Man, MD  levocetirizine (XYZAL) 5 MG tablet Take 5 mg by mouth every evening.  01/20/18  Yes [provider]  montelukast (SINGULAIR) 10 MG tablet Take 10 mg by mouth daily. 10/05/19  Yes [provider]  Multiple Vitamins-Minerals (ICAPS AREDS 2 PO) Take 1 tablet by mouth daily.    Yes [provider]  nitroGLYCERIN (NITROSTAT) 0.4 MG SL tablet PLACE 1 TABLET UNDER THE TONGUE EVERY 5 MINUTES AS NEEDED FOR CHEST PAIN Patient taking differently: Place 0.4 mg under the tongue every 5 (five) minutes as needed for chest pain.  11/26/17  Yes Leonie Man, MD     Positive ROS: Otherwise negative  All other systems have been reviewed and were otherwise negative with the exception of those mentioned in the HPI and as above.  Physical Exam: Constitutional: Alert, well-appearing, no acute distress Ears: External ears without lesions or tenderness. He has large amount of wax in both ear canals that were cleaned. On the right side he has an extruded Paparella type I myringotomy tube within the wax. After cleaning this patient has a small central TM perforation which is dry. Nasal: External nose without lesions. Clear nasal passages Oral: Lips and gums without lesions. Tongue and palate mucosa without lesions. Posterior oropharynx clear. Neck: No palpable adenopathy or masses Respiratory: Breathing comfortably  Skin: No facial/neck lesions or rash noted.  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    Assessment: Wax buildup in both ears. Persistent right TM perforation status post M&T performed in July 2020. Bilateral sensorineural hearing loss.  Plan: Ear canals were cleaned in the office today. He heard better after cleaning the ear canals. I discussed with him that he has a small right central TM perforation and should keep water out of the right ear. No need to replace the right myringotomy tube as he has a persistent perforation at this point. He has had previous myringotomy tubes placed twice before.   Radene Journey, MD

## 2020-02-13 DIAGNOSIS — I10 Essential (primary) hypertension: Secondary | ICD-10-CM | POA: Diagnosis not present

## 2020-02-13 DIAGNOSIS — E7800 Pure hypercholesterolemia, unspecified: Secondary | ICD-10-CM | POA: Diagnosis not present

## 2020-02-13 DIAGNOSIS — I5032 Chronic diastolic (congestive) heart failure: Secondary | ICD-10-CM | POA: Diagnosis not present

## 2020-02-13 DIAGNOSIS — I482 Chronic atrial fibrillation, unspecified: Secondary | ICD-10-CM | POA: Diagnosis not present

## 2020-02-24 DIAGNOSIS — I5032 Chronic diastolic (congestive) heart failure: Secondary | ICD-10-CM | POA: Diagnosis not present

## 2020-02-24 DIAGNOSIS — I10 Essential (primary) hypertension: Secondary | ICD-10-CM | POA: Diagnosis not present

## 2020-02-24 DIAGNOSIS — E7800 Pure hypercholesterolemia, unspecified: Secondary | ICD-10-CM | POA: Diagnosis not present

## 2020-02-24 DIAGNOSIS — I482 Chronic atrial fibrillation, unspecified: Secondary | ICD-10-CM | POA: Diagnosis not present

## 2020-03-09 ENCOUNTER — Ambulatory Visit: Payer: PPO | Admitting: Podiatry

## 2020-03-19 DIAGNOSIS — H353223 Exudative age-related macular degeneration, left eye, with inactive scar: Secondary | ICD-10-CM | POA: Diagnosis not present

## 2020-03-27 DIAGNOSIS — I482 Chronic atrial fibrillation, unspecified: Secondary | ICD-10-CM | POA: Diagnosis not present

## 2020-03-27 DIAGNOSIS — I5032 Chronic diastolic (congestive) heart failure: Secondary | ICD-10-CM | POA: Diagnosis not present

## 2020-03-27 DIAGNOSIS — E7800 Pure hypercholesterolemia, unspecified: Secondary | ICD-10-CM | POA: Diagnosis not present

## 2020-03-27 DIAGNOSIS — I10 Essential (primary) hypertension: Secondary | ICD-10-CM | POA: Diagnosis not present

## 2020-04-13 ENCOUNTER — Ambulatory Visit: Payer: PPO | Admitting: Podiatry

## 2020-04-26 DIAGNOSIS — E7800 Pure hypercholesterolemia, unspecified: Secondary | ICD-10-CM | POA: Diagnosis not present

## 2020-04-26 DIAGNOSIS — I482 Chronic atrial fibrillation, unspecified: Secondary | ICD-10-CM | POA: Diagnosis not present

## 2020-04-26 DIAGNOSIS — I10 Essential (primary) hypertension: Secondary | ICD-10-CM | POA: Diagnosis not present

## 2020-04-26 DIAGNOSIS — I5032 Chronic diastolic (congestive) heart failure: Secondary | ICD-10-CM | POA: Diagnosis not present

## 2020-04-30 ENCOUNTER — Ambulatory Visit (HOSPITAL_COMMUNITY)
Admission: RE | Admit: 2020-04-30 | Discharge: 2020-04-30 | Disposition: A | Discharge status: Home / Self Care | Payer: PPO | Source: Ambulatory Visit | Attending: Physician Assistant | Admitting: Physician Assistant

## 2020-04-30 ENCOUNTER — Ambulatory Visit: Payer: PPO | Admitting: Physician Assistant

## 2020-04-30 ENCOUNTER — Other Ambulatory Visit: Payer: Self-pay

## 2020-04-30 VITALS — BP 124/64 | HR 62 | Temp 98.4°F | Resp 20 | Ht 71.0 in | Wt 182.6 lb

## 2020-04-30 DIAGNOSIS — I714 Abdominal aortic aneurysm, without rupture, unspecified: Secondary | ICD-10-CM

## 2020-04-30 NOTE — Progress Notes (Signed)
Established Abdominal Aortic Aneurysm   History of Present Illness   [name] is a 87 y.o. (17-Aug-1931) male who presents with chief complaint: follow up for AAA.  Previous studies demonstrate an AAA, measuring 5 cm.  The patient denies new or changing abdominal or back pain.  He also denies claudication, rest pain, or non healing wounds of BLE. He is on Eliquis for atrial fibrillation.  The patient's PMH, PSH, SH, and FamHx were reviewed and are unchanged from prior visit.  Current Outpatient Medications  Medication Sig Dispense Refill  . alfuzosin (UROXATRAL) 10 MG 24 hr tablet Take 10 mg by mouth daily.  10  . atorvastatin (LIPITOR) 20 MG tablet 1 tablet    . diclofenac Sodium (VOLTAREN) 1 % GEL Apply topically 4 (four) times daily.    Marland Kitchen diltiazem (TIAZAC) 180 MG 24 hr capsule Take 1 capsule (180 mg total) by mouth daily. 90 capsule 1  . dutasteride (AVODART) 0.5 MG capsule Take 0.5 mg by mouth daily.   3  . ELIQUIS 5 MG TABS tablet TAKE ONE TABLET BY MOUTH AT BREAKFAST AND AT BEDTIME 180 tablet 1  . fluticasone (FLONASE) 50 MCG/ACT nasal spray Place 2 sprays into both nostrils daily as needed for allergies.   5  . Glucosamine HCl (GLUCOSAMINE PO) Take 1,500 mg by mouth 3 (three) times daily.    Marland Kitchen levocetirizine (XYZAL) 5 MG tablet Take 5 mg by mouth every evening.   5  . montelukast (SINGULAIR) 10 MG tablet Take 10 mg by mouth daily.    . Multiple Vitamins-Minerals (ICAPS AREDS 2 PO) Take 1 tablet by mouth daily.     . nitroGLYCERIN (NITROSTAT) 0.4 MG SL tablet PLACE 1 TABLET UNDER THE TONGUE EVERY 5 MINUTES AS NEEDED FOR CHEST PAIN (Patient taking differently: Place 0.4 mg under the tongue every 5 (five) minutes as needed for chest pain.) 75 tablet 1  . furosemide (LASIX) 40 MG tablet MAY TAKE 20 TO 40 MG TABLETS AS NEEDED FOR  SWELLING (Patient not taking: Reported on 04/30/2020) 180 tablet 2  . KLOR-CON M20 20 MEQ tablet TAKE 1 TABLET BY MOUTH WITH LASIX AS NEEDED (Patient not  taking: Reported on 04/30/2020) 90 tablet 2   No current facility-administered medications for this visit.    REVIEW OF SYSTEMS (negative unless checked):   Cardiac:  []  Chest pain or chest pressure? []  Shortness of breath upon activity? []  Shortness of breath when lying flat? []  Irregular heart rhythm?  Vascular:  []  Pain in calf, thigh, or hip brought on by walking? []  Pain in feet at night that wakes you up from your sleep? []  Blood clot in your veins? []  Leg swelling?  Pulmonary:  []  Oxygen at home? []  Productive cough? []  Wheezing?  Neurologic:  []  Sudden weakness in arms or legs? []  Sudden numbness in arms or legs? []  Sudden onset of difficult speaking or slurred speech? []  Temporary loss of vision in one eye? []  Problems with dizziness?  Gastrointestinal:  []  Blood in stool? []  Vomited blood?  Genitourinary:  []  Burning when urinating? []  Blood in urine?  Psychiatric:  []  Major depression  Hematologic:  []  Bleeding problems? []  Problems with blood clotting?  Dermatologic:  []  Rashes or ulcers?  Constitutional:  []  Fever or chills?  Ear/Nose/Throat:  []  Change in hearing? []  Nose bleeds? []  Sore throat?  Musculoskeletal:  []  Back pain? []  Joint pain? []  Muscle pain?   Physical Examination   Vitals:  04/30/20 0931  BP: 124/64  Pulse: 62  Resp: 20  Temp: 98.4 F (36.9 C)  TempSrc: Temporal  SpO2: 97%  Weight: 182 lb 9.6 oz (82.8 kg)  Height: 5\' 11"  (1.803 m)   Body mass index is 25.47 kg/m.  General:  WDWN in NAD; vital signs documented above Gait: Not observed HENT: WNL, normocephalic Pulmonary: normal non-labored breathing , without Rales, rhonchi,  wheezing Cardiac: regular HR Abdomen: soft, NT, no masses Skin: without rashes Vascular Exam/Pulses:  Right Left  Radial 2+ (normal) 2+ (normal)  DP absent 1+ (weak)  PT 2+ (normal) 1+ (weak)   Extremities: without ischemic changes, without Gangrene , without cellulitis;  without open wounds;  Musculoskeletal: no muscle wasting or atrophy  Neurologic: A&O X 3;  No focal weakness or paresthesias are detected Psychiatric:  The pt has Normal affect.   Non-Invasive Vascular Imaging   AAA Duplex   Current size: 5 cm  Previous size: 5 cm  R CIA: 1 cm  L CIA: 1 cm   Medical Decision Making   [name] is a 87 y.o. (1931-05-27) male who presents for surveillance of asymptomatic AAA   AAA duplex unchanged compared to January 2021 study with AAA measuring 5 cm in maximal diameter; CT abdomen pelvis in July 2021 also demonstrating a maximal diameter of 5 cm  Dr. Donnetta Hutching has discussed with the patient in the past that he would repair AAA endovascularly if it approached 5.5 cm  Plan will be to repeat AAA duplex in 6 months and patient will follow up with Dr. Stanford Breed  He will call/return office sooner with any questions or concerns   Dagoberto Ligas PA-C [facility] Office: [facility] MD: Trula Slade

## 2020-05-01 ENCOUNTER — Other Ambulatory Visit: Payer: Self-pay

## 2020-05-01 DIAGNOSIS — I714 Abdominal aortic aneurysm, without rupture, unspecified: Secondary | ICD-10-CM

## 2020-05-14 DIAGNOSIS — H353223 Exudative age-related macular degeneration, left eye, with inactive scar: Secondary | ICD-10-CM | POA: Diagnosis not present

## 2020-05-16 ENCOUNTER — Other Ambulatory Visit: Payer: Self-pay

## 2020-05-16 ENCOUNTER — Ambulatory Visit: Payer: PPO | Admitting: Podiatry

## 2020-05-16 ENCOUNTER — Encounter: Payer: Self-pay | Admitting: Podiatry

## 2020-05-16 DIAGNOSIS — M79676 Pain in unspecified toe(s): Secondary | ICD-10-CM | POA: Diagnosis not present

## 2020-05-16 DIAGNOSIS — D689 Coagulation defect, unspecified: Secondary | ICD-10-CM | POA: Diagnosis not present

## 2020-05-16 DIAGNOSIS — M129 Arthropathy, unspecified: Secondary | ICD-10-CM

## 2020-05-16 DIAGNOSIS — B351 Tinea unguium: Secondary | ICD-10-CM

## 2020-05-16 NOTE — Progress Notes (Signed)
This patient returns to my office for at risk foot care.  This patient requires this care by a professional since this patient will be at risk due to having coagulation defect.  Patient is taking eliquiss.  This patient is unable to cut nails himself since the patient cannot reach his nails.These nails are painful walking and wearing shoes.  This patient presents for at risk foot care today.   Patient is taking eliquiss.  General Appearance  Alert, conversant and in no acute stress.  Vascular  Dorsalis pedis and posterior tibial  pulses are palpable  bilaterally.  Capillary return is within normal limits  bilaterally. Temperature is within normal limits  bilaterally.  Neurologic  Senn-Weinstein monofilament wire test within normal limits  bilaterally. Muscle power within normal limits bilaterally.  Nails Thick disfigured discolored nails with subungual debris  from hallux to fifth toes bilaterally. No evidence of bacterial infection or drainage bilaterally.  Orthopedic  No limitations of motion  feet .  No crepitus or effusions noted.  No bony pathology or digital deformities noted.  DJD 1st MPJ  B/L.  Skin  normotropic skin with no porokeratosis noted bilaterally.  No signs of infections or ulcers noted.     Onychomycosis  Pain in right toes  Pain in left toes  Consent was obtained for treatment procedures.   Mechanical debridement of nails 1-5  bilaterally performed with a nail nipper.  Filed with dremel without incident.    Return office visit     4 months                Told patient to return for periodic foot care and evaluation due to potential at risk complications.   Catrina Fellenz DPM  

## 2020-05-17 ENCOUNTER — Other Ambulatory Visit: Payer: Self-pay | Admitting: Cardiology

## 2020-05-23 ENCOUNTER — Encounter: Payer: Self-pay | Admitting: Cardiology

## 2020-05-23 ENCOUNTER — Other Ambulatory Visit: Payer: Self-pay

## 2020-05-23 ENCOUNTER — Ambulatory Visit: Payer: PPO | Admitting: Cardiology

## 2020-05-23 VITALS — BP 126/68 | HR 63 | Ht 71.0 in | Wt 179.0 lb

## 2020-05-23 DIAGNOSIS — I5032 Chronic diastolic (congestive) heart failure: Secondary | ICD-10-CM | POA: Diagnosis not present

## 2020-05-23 DIAGNOSIS — I714 Abdominal aortic aneurysm, without rupture, unspecified: Secondary | ICD-10-CM

## 2020-05-23 DIAGNOSIS — R339 Retention of urine, unspecified: Secondary | ICD-10-CM | POA: Diagnosis not present

## 2020-05-23 DIAGNOSIS — I4819 Other persistent atrial fibrillation: Secondary | ICD-10-CM

## 2020-05-23 DIAGNOSIS — I2510 Atherosclerotic heart disease of native coronary artery without angina pectoris: Secondary | ICD-10-CM | POA: Diagnosis not present

## 2020-05-23 DIAGNOSIS — E785 Hyperlipidemia, unspecified: Secondary | ICD-10-CM | POA: Diagnosis not present

## 2020-05-23 DIAGNOSIS — I25119 Atherosclerotic heart disease of native coronary artery with unspecified angina pectoris: Secondary | ICD-10-CM | POA: Diagnosis not present

## 2020-05-23 DIAGNOSIS — Z9861 Coronary angioplasty status: Secondary | ICD-10-CM | POA: Diagnosis not present

## 2020-05-23 NOTE — Patient Instructions (Signed)
Medication Instructions:    *If you need a refill on your cardiac medications before your next appointment, please call your pharmacy*   Lab Work:  If you have labs (blood work) drawn today and your tests are completely normal, you will receive your results only by: Marland Kitchen MyChart Message (if you have MyChart) OR . A paper copy in the mail If you have any lab test that is abnormal or we need to change your treatment, we will call you to review the results.   Testing/Procedures:    Follow-Up: At [facility], you and your health needs are our priority.  As part of our continuing mission to provide you with exceptional heart care, we have created designated Provider Care Teams.  These Care Teams include your primary Cardiologist (physician) and Advanced Practice Providers (APPs -  Physician Assistants and Nurse Practitioners) who all work together to provide you with the care you need, when you need it.     Your next appointment:   6 month(s)  The format for your next appointment:   In Person  Provider:   Glenetta Hew, MD

## 2020-05-23 NOTE — Progress Notes (Signed)
Primary Care Provider: Lajean Manes, MD Cardiologist: Glenetta Hew, MD  Vascular Surgeon: Previously Dr. Curt Jews, now Dr. Stanford Breed. Electrophysiologist: None  Clinic Note: Chief Complaint  Patient presents with  . Follow-up    6 months.  Doing well.  Stable.  . Coronary Artery Disease    No angina  . Congestive Heart Failure    No PND, orthopnea with trivial edema.  . Atrial Fibrillation    No recurrent symptoms.  No bleeding on Eliquis.   ===================================  ASSESSMENT/PLAN   Problem List Items Addressed This Visit    CAD S/P Prox-Mid LAD; Promus DES 02/10/13 (Chronic)    Just history of LAD PCI.  No further angina.  Not on antiplatelet agent (either aspirin or Plavix) because of Eliquis.  On diltiazem as opposed to beta-blocker because of less fatigue.  Need for rate control. On statin.      Relevant Medications   nitroGLYCERIN (NITROSTAT) 0.4 MG SL tablet   Other Relevant Orders   EKG 12-Lead (Completed)   Persistent atrial fibrillation (HCC)-CHA2DS2-VASc score 3-4.  On Eliquis (Chronic)    Paroxysmal, persistent A. fib.  As far as he can tell, no breakthrough in the last 6 months.  At least not to the point where he has been symptomatic.  He is on diltiazem for rate control, tolerating it relatively well.  There is also some antianginal benefit. We arrange anticoagulated on Eliquis.  No bleeding issues.        Relevant Medications   nitroGLYCERIN (NITROSTAT) 0.4 MG SL tablet   Other Relevant Orders   EKG 12-Lead (Completed)   Chronic diastolic CHF (congestive heart failure), NYHA class 2 (HCC) (Chronic)    He really notes NYHA class I-II symptoms.  Mostly class I unless he goes in A. fib.  He does become symptomatic when in A. fib. No longer on standing dose of Lasix, edema does not seem to be a problem.  Simply doing foot elevation.  No orthopnea or PND.      Relevant Medications   nitroGLYCERIN (NITROSTAT) 0.4 MG SL tablet   Coronary  artery disease involving native coronary artery of native heart with angina pectoris (HCC) - Primary (Chronic)    No further angina.  Negative Myoview last year.  Remains on diltiazem without Imdur.  He is on atorvastatin at lower dose with well-controlled lipids. Not on antiplatelet agent because of Eliquis.      Relevant Medications   nitroGLYCERIN (NITROSTAT) 0.4 MG SL tablet   Hyperlipidemia with target LDL less than 70 (Chronic)    Lipids well controlled on current dose of atorvastatin.  No change.      Relevant Medications   nitroGLYCERIN (NITROSTAT) 0.4 MG SL tablet   AAA (abdominal aortic aneurysm)-5.0 cm by CT scan 09/2019 (Chronic)    Now followed by Dr. Stanford Breed from vascular surgery.  Plan is to wait until it reaches 5.5 cm before they would consider EVAR.  Being followed up with ultrasound every 6 months.  Continue aggressive cardiovascular risk factor modification with blood pressure control, statin.      Relevant Medications   nitroGLYCERIN (NITROSTAT) 0.4 MG SL tablet     ===================================  HPI:    [name] is a 87 y.o. male with a cardiac history noted below who presents today for 73-month follow-up.[name]  CARDIAC HISTORY  H/o AnteriorSTEMI; November 2014 - w/ VT: p-mLAD 95-99% & 80% TANDEM LESIONS -- PCI w/ PROMUS PREMIER DES 2.75 mm X 24 mm(2.9 mm)  STEMI complicated by Afib and V. tach- no further recurrence until January 2021  [place] in January 2021: EF 50%.  Normal wall motion.  A. fib limited gating.  Small apical, partially reversible defect.  Read as low risk  Initial mild ICM - resolved with EF 50-55% (06/2014) -> intolerant of beta-blockers because of fatigue and hypotension, also no ARB because of hypotension.  AAA - followed by [name] (seen on February 7 -> mid aorta measures 5 cm, unchanged from January 2021; standing plan is to consider repair via EVAR if the aneurysm approaches 5.5 cm. ->  Has transitioned care to Dr.  Stanford Breed)   Recurrent ATRIAL FIBRILLATION -April 14, 2019 -> admitted for chest pain, found to have A. fib (Echocardiogram and Myoview ordered, both relatively normal).  Started on apixaban but no rate control. ? Clinic-May 04, 2019-started diltiazem 60 mg 3 times daily; May 10, 2019 -noted improved heart rates, but more significant edema and exertional dyspnea, orthopnea and PND  (HFpEF related to A. fib)->  Started on Lasix 40 mg twice daily for  ? Plan was to consider cardioversion if he had breakthrough, then consider the possibility of antiarrhythmic agent. ? August 12-told to reduce dose of diltiazem down to 120 mg (dose have been consolidated) daily and split up from Imdur.  Lasix may PRN  [name] was last seen on November 29, 2019.  Doing well.  No more chest pain other than some musculoskeletal twinging symptoms.  More rib cage pain.  Some dizzy spells/orthopnea.  In the less energy.  No recurrent symptoms of A. fib.  No syncope or near syncope.  No PND orthopnea.  Well improved edema.  No bleeding on Eliquis.  Back to baseline exertion.  Stable blood pressures in the systolic range of 102-585 mmHg.  Recent Hospitalizations: None  Reviewed  CV studies:    The following studies were reviewed today: (if available, images/films reviewed: From Epic Chart or Care Everywhere) . None: I asked  Interval History:   [name] returns here today for 77-month follow-up.  He is having no heart failure symptoms of PND, orthopnea with minimal edema.  The it is really just end of the day swelling goes down at night.  He actually does not have furosemide listed anymore.  He is not all that active, so if he overexerts, he may get short of breath, but not with routine activity. Blood pressures been pretty stable since we reduce his dose of diltiazem.  He has had no recurrent episodes to suggest recurrence of A. Fib.  He still has some mild left upper chest musculoskeletal type  discomfort which is really going into the shoulder at the clavicle level.  No anginal type symptoms.  CV Review of Symptoms (Summary): no chest pain or dyspnea on exertion positive for - Only notes mild end of day edema and exertional dyspnea if he overdoes it. negative for - chest pain, irregular heartbeat, orthopnea, palpitations, paroxysmal nocturnal dyspnea, rapid heart rate, shortness of breath or Lightheadedness, dizziness, wooziness, syncope/near syncope or TIA/amaurosis fugax, claudication  The patient does not have symptoms concerning for COVID-19 infection (fever, chills, cough, or new shortness of breath).   REVIEWED OF SYSTEMS   Review of Systems  Constitutional: Negative for malaise/fatigue (Energy is much improved.) and weight loss.  HENT: Negative for congestion and nosebleeds.   Respiratory: Positive for wheezing (Wife says he wheezes at night, but he has no issues with dyspnea.). Negative for cough and shortness of  breath.   Gastrointestinal: Negative for blood in stool and melena.  Musculoskeletal: Positive for back pain and joint pain (Left upper shoulder). Negative for falls.  Neurological: Negative for dizziness, focal weakness and weakness.  Psychiatric/Behavioral: Negative for depression and memory loss. The patient is not nervous/anxious and does not have insomnia.    I have reviewed and (if needed) personally updated the patient's problem list, medications, allergies, past medical and surgical history, social and family history.   PAST MEDICAL HISTORY   Past Medical History:  Diagnosis Date  . Abdominal aortic aneurysm ([place]) 03/2019    Mid aortic large diameter 5.0 cm.  Has increased compared to 4.6- 4.8 on prior study  . Arthritis   . CAD S/P percutaneous coronary angioplasty 02/13/2013   PROXIMAL-MID LAD 95-99% AND 80% TANDEM LESIONS - PROMUS PREMIER DES 2.75 MM X 24 MM (2.9 MM)  . Macular degeneration of left eye   . Paroxysmal (persistent) atrial  fibrillation ([place]) 02/13/2013   Initially was peri-infarction at time of anterior MI; no recurrence from 2014 until January 2021.  Now persistent  . Prostate disease   . 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 ST elevation myocardial infarction (STEMI) of anterior wall, initial episode of care ([place]) 02/10/2013  . Tremors of nervous system    both hands-under control    PAST SURGICAL HISTORY   Past Surgical History:  Procedure Laterality Date  . AAA DUPLEX  03/2019   AAA DUPLEX June 2020: Largest Ao Ddiameter unchanged from Oct 2019 -> ~4.6 cm; January 21: Mid aortic large diameter 5.0 cm.  Has increased compared to 4.6- 4.8 on prior study.    . APPENDECTOMY  age 72   . CYSTOSCOPY W/ RETROGRADES Bilateral 02/03/2014   Procedure:  BILATERAL RETROGRADE PYELOGRAM;  Surgeon: Festus Aloe, MD;  Location: [facility];  Service: Urology;  Laterality: Bilateral;  . GREEN LIGHT LASER TURP (TRANSURETHRAL RESECTION OF PROSTATE N/A 02/03/2014   Procedure: GREEN LIGHT LASER TURP (TRANSURETHRAL RESECTION OF PROSTATE PHOTO VAPORIZATION;  Surgeon: Festus Aloe, MD;  Location: [facility];  Service: Urology;  Laterality: N/A;  . HERNIA REPAIR Left 2000  . LEFT HEART CATHETERIZATION WITH CORONARY ANGIOGRAM N/A 02/10/2013   Procedure: LEFT HEART CATHETERIZATION WITH CORONARY ANGIOGRAM;  Surgeon: Leonie Man, MD;  Location: [facility] CATH LAB;  Service: Cardiovascular;  Laterality: N/A;  . NM MYOVIEW LTD  03/2019   Performed to evaluate A. fib: Baseline A. fib.  No ST segment changes.  EF estimated 49% with normal wall motion, but gating was limited because of A. fib.  Small size, mild severity mostly fixed apical defect suggestive of small prior infarct with mild peri-infarct ischemia.  LOW RISK.  No evidence of large or high risk ischemia present.  (Likely consistent with prior infarct)  . PERCUTANEOUS CORONARY STENT INTERVENTION (PCI-S)  02/10/13   PROXIMAL-MID LAD 95-99% AND 80% TANDEM LESIONS - PROMUS  PREMIER DES 2.75 MM X 24 MM (2.9 MM)  . shot in left eye 03/2019 for macular degeneration    . TRANSTHORACIC ECHOCARDIOGRAM  03/2019   Normal LVEF 50 to 55%.  No R WMA.  Unable to assess diastolic function because of A. fib.  Mild LA dilation.  Mild aortic sclerosis without stenosis    Immunization History  Administered Date(s) Administered  . Influenza, High Dose Seasonal PF 01/25/2014    MEDICATIONS/ALLERGIES   Current Meds  Medication Sig  . alfuzosin (UROXATRAL) 10 MG 24 hr tablet Take 10  mg by mouth daily.  [name] apixaban (ELIQUIS) 5 MG TABS tablet Take 1 tablet (5 mg total) by mouth 2 (two) times daily. Labs needed for further refills  . atorvastatin (LIPITOR) 20 MG tablet 1 tablet  . diclofenac Sodium (VOLTAREN) 1 % GEL Apply topically 4 (four) times daily.  [name] diltiazem (TIAZAC) 180 MG 24 hr capsule TAKE ONE CAPSULE BY MOUTH EVERYDAY AT BEDTIME  . fluticasone (FLONASE) 50 MCG/ACT nasal spray Place 2 sprays into both nostrils daily as needed for allergies.   . Glucosamine HCl (GLUCOSAMINE PO) Take 1,500 mg by mouth 3 (three) times daily.  . nitroGLYCERIN (NITROSTAT) 0.4 MG SL tablet Place 0.4 mg under the tongue every 5 (five) minutes as needed for chest pain.    Allergies  Allergen Reactions  . Ciprofloxacin Diarrhea and Nausea Only  . Other Other (See Comments)    SOCIAL HISTORY/FAMILY HISTORY   Reviewed in Epic:  Pertinent findings:  Social History   Tobacco Use  . Smoking status: Former Smoker    Packs/day: 1.00    Years: 10.00    Pack years: 10.00    Types: Cigarettes    Quit date: 06/07/1983    Years since quitting: 37.0  . Smokeless tobacco: Never Used  Vaping Use  . Vaping Use: Never used  Substance Use Topics  . Alcohol use: Yes    Alcohol/week: 0.0 standard drinks    Comment: social  . Drug use: No   Social History   Social History Narrative   He is married.     Home - lives with wife; NOK: Wife, Dace Denn, [phone]      Retired.   Quit  smoking in 1985. Does not drink alcohol.    OBJCTIVE -PE, EKG, labs   Wt Readings from Last 3 Encounters:  05/23/20 179 lb (81.2 kg)  04/30/20 182 lb 9.6 oz (82.8 kg)  11/29/19 178 lb (80.7 kg)    Physical Exam: BP 126/68   Pulse 63   Ht 5\' 11"  (1.803 m)   Wt 179 lb (81.2 kg)   SpO2 96%   BMI 24.97 kg/m  Physical Exam Vitals reviewed.  Constitutional:      General: He is not in acute distress.    Appearance: Normal appearance. He is ill-appearing (Healthy-appearing.  Well-groomed.). He is not toxic-appearing.  HENT:     Head: Normocephalic and atraumatic.  Neck:     Vascular: No carotid bruit (Radiated aortic murmur.), hepatojugular reflux or JVD.  Cardiovascular:     Rate and Rhythm: Normal rate and regular rhythm.  No extrasystoles are present.    Chest Wall: PMI is not displaced.     Pulses: Normal pulses.     Heart sounds: S1 normal and S2 normal. Heart sounds are distant. Murmur (1/6 SEM at RUSB -> neck) heard.  No friction rub. No gallop.   Pulmonary:     Effort: Pulmonary effort is normal. No respiratory distress.     Breath sounds: Normal breath sounds.  Chest:     Chest wall: No tenderness.  Musculoskeletal:        General: Swelling (Trivial 1+ LE edema) present.     Cervical back: Normal range of motion and neck supple.  Skin:    General: Skin is warm and dry.  Neurological:     General: No focal deficit present.     Mental Status: He is alert and oriented to person, place, and time.     Motor: No weakness.  Gait: Gait normal.  Psychiatric:        Mood and Affect: Mood normal.        Behavior: Behavior normal.        Thought Content: Thought content normal.        Judgment: Judgment normal.     Comments: Sharp memory     Adult ECG Report  Rate: 63;  Rhythm: normal sinus rhythm and Left axis deviation, nonspecific ST and T wave changes.;   Narrative Interpretation: Stable  Recent Labs: He is due for recheck labs in May.  08/01/2019: TC 109, TG  85, HDL 38, LDL 55.  Last A1c 04/14/2019 5.7%.  Lab Results  Component Value Date   CREATININE 1.15 05/16/2019   BUN 20 05/16/2019   NA 137 05/16/2019   K 4.6 05/16/2019   CL 99 05/16/2019   CO2 24 05/16/2019   CBC Latest Ref Rng & Units 04/14/2019 01/27/2014 02/16/2013  WBC 4.0 - 10.5 K/uL 10.1 6.7 12.9(H)  Hemoglobin 13.0 - 17.0 g/dL 14.1 14.8 15.2  Hematocrit 39.0 - 52.0 % 43.3 45.5 43.6  Platelets 150 - 400 K/uL 155 147(L) 225    Lab Results  Component Value Date   TSH 3.255 04/14/2019    ==================================================  COVID-19 Education: The signs and symptoms of COVID-19 were discussed with the patient and how to seek care for testing (follow up with PCP or arrange E-visit).   The importance of social distancing and COVID-19 vaccination was discussed today. The patient is practicing social distancing & Masking.   I spent a total of 15 minutes with the patient spent in direct patient consultation.  Additional time spent with chart review  / charting (studies, outside notes, etc): 15 min Total Time: 30 min  Current medicines are reviewed at length with the patient today.  (+/- concerns) none  This visit occurred during the SARS-CoV-2 public health emergency.  Safety protocols were in place, including screening questions prior to the visit, additional usage of staff PPE, and extensive cleaning of exam room while observing appropriate contact time as indicated for disinfecting solutions.  Notice: This dictation was prepared with Dragon dictation along with smaller phrase technology. Any transcriptional errors that result from this process are unintentional and may not be corrected upon review.  Patient Instructions / Medication Changes & Studies & Tests Ordered   Patient Instructions  Medication Instructions:    *If you need a refill on your cardiac medications before your next appointment, please call your pharmacy*   Lab Work:  If you have  labs (blood work) drawn today and your tests are completely normal, you will receive your results only by: [name] MyChart Message (if you have MyChart) OR . A paper copy in the mail If you have any lab test that is abnormal or we need to change your treatment, we will call you to review the results.   Testing/Procedures:    Follow-Up: At [facility], you and your health needs are our priority.  As part of our continuing mission to provide you with exceptional heart care, we have created designated Provider Care Teams.  These Care Teams include your primary Cardiologist (physician) and Advanced Practice Providers (APPs -  Physician Assistants and Nurse Practitioners) who all work together to provide you with the care you need, when you need it.     Your next appointment:   6 month(s)  The format for your next appointment:   In Person  Provider:   Glenetta Hew, MD  Studies Ordered:   Orders Placed This Encounter  Procedures  . EKG 12-Lead     Glenetta Hew, M.D., M.S. Interventional Cardiologist   Pager # [phone] Phone # [phone] [address] [place], [place] [address]   Thank you for choosing Heartcare at [facility]!!

## 2020-06-18 ENCOUNTER — Encounter: Payer: Self-pay | Admitting: Cardiology

## 2020-06-18 NOTE — Assessment & Plan Note (Signed)
Paroxysmal, persistent A. fib.  As far as he can tell, no breakthrough in the last 6 months.  At least not to the point where he has been symptomatic.  He is on diltiazem for rate control, tolerating it relatively well.  There is also some antianginal benefit. We arrange anticoagulated on Eliquis.  No bleeding issues.

## 2020-06-18 NOTE — Assessment & Plan Note (Signed)
Lipids well controlled on current dose of atorvastatin.  No change.

## 2020-06-18 NOTE — Assessment & Plan Note (Addendum)
Now followed by Dr. Stanford Breed from vascular surgery.  Plan is to wait until it reaches 5.5 cm before they would consider EVAR.  Being followed up with ultrasound every 6 months.  Continue aggressive cardiovascular risk factor modification with blood pressure control, statin.

## 2020-06-18 NOTE — Assessment & Plan Note (Signed)
He really notes NYHA class I-II symptoms.  Mostly class I unless he goes in A. fib.  He does become symptomatic when in A. fib. No longer on standing dose of Lasix, edema does not seem to be a problem.  Simply doing foot elevation.  No orthopnea or PND.

## 2020-06-18 NOTE — Assessment & Plan Note (Signed)
Just history of LAD PCI.  No further angina.  Not on antiplatelet agent (either aspirin or Plavix) because of Eliquis.  On diltiazem as opposed to beta-blocker because of less fatigue.  Need for rate control. On statin.

## 2020-06-18 NOTE — Assessment & Plan Note (Signed)
No further angina.  Negative Myoview last year.  Remains on diltiazem without Imdur.  He is on atorvastatin at lower dose with well-controlled lipids. Not on antiplatelet agent because of Eliquis.

## 2020-06-20 ENCOUNTER — Other Ambulatory Visit: Payer: Self-pay | Admitting: Cardiology

## 2020-06-20 DIAGNOSIS — I482 Chronic atrial fibrillation, unspecified: Secondary | ICD-10-CM | POA: Diagnosis not present

## 2020-06-20 DIAGNOSIS — E7800 Pure hypercholesterolemia, unspecified: Secondary | ICD-10-CM | POA: Diagnosis not present

## 2020-06-20 DIAGNOSIS — I5032 Chronic diastolic (congestive) heart failure: Secondary | ICD-10-CM | POA: Diagnosis not present

## 2020-06-20 DIAGNOSIS — I10 Essential (primary) hypertension: Secondary | ICD-10-CM | POA: Diagnosis not present

## 2020-06-20 NOTE — Telephone Encounter (Signed)
Prescription refill request for Eliquis received. Indication: atrial fibrillation Last office visit:3/22 [place] Scr:1.2 5/21 Age: 87 Weight:81.2 kg  Prescription refilled

## 2020-06-25 DIAGNOSIS — I10 Essential (primary) hypertension: Secondary | ICD-10-CM | POA: Diagnosis not present

## 2020-06-25 DIAGNOSIS — I5032 Chronic diastolic (congestive) heart failure: Secondary | ICD-10-CM | POA: Diagnosis not present

## 2020-06-25 DIAGNOSIS — I482 Chronic atrial fibrillation, unspecified: Secondary | ICD-10-CM | POA: Diagnosis not present

## 2020-06-25 DIAGNOSIS — E7800 Pure hypercholesterolemia, unspecified: Secondary | ICD-10-CM | POA: Diagnosis not present

## 2020-07-02 DIAGNOSIS — R339 Retention of urine, unspecified: Secondary | ICD-10-CM | POA: Diagnosis not present

## 2020-07-11 DIAGNOSIS — H353223 Exudative age-related macular degeneration, left eye, with inactive scar: Secondary | ICD-10-CM | POA: Diagnosis not present

## 2020-07-24 DIAGNOSIS — E7800 Pure hypercholesterolemia, unspecified: Secondary | ICD-10-CM | POA: Diagnosis not present

## 2020-07-24 DIAGNOSIS — I482 Chronic atrial fibrillation, unspecified: Secondary | ICD-10-CM | POA: Diagnosis not present

## 2020-07-24 DIAGNOSIS — I5032 Chronic diastolic (congestive) heart failure: Secondary | ICD-10-CM | POA: Diagnosis not present

## 2020-07-24 DIAGNOSIS — I10 Essential (primary) hypertension: Secondary | ICD-10-CM | POA: Diagnosis not present

## 2020-07-30 DIAGNOSIS — R339 Retention of urine, unspecified: Secondary | ICD-10-CM | POA: Diagnosis not present

## 2020-08-08 DIAGNOSIS — M25512 Pain in left shoulder: Secondary | ICD-10-CM | POA: Diagnosis not present

## 2020-08-08 DIAGNOSIS — N320 Bladder-neck obstruction: Secondary | ICD-10-CM | POA: Diagnosis not present

## 2020-08-08 DIAGNOSIS — H353221 Exudative age-related macular degeneration, left eye, with active choroidal neovascularization: Secondary | ICD-10-CM | POA: Diagnosis not present

## 2020-08-08 DIAGNOSIS — Z0000 Encounter for general adult medical examination without abnormal findings: Secondary | ICD-10-CM | POA: Diagnosis not present

## 2020-08-08 DIAGNOSIS — Z23 Encounter for immunization: Secondary | ICD-10-CM | POA: Diagnosis not present

## 2020-08-08 DIAGNOSIS — I5032 Chronic diastolic (congestive) heart failure: Secondary | ICD-10-CM | POA: Diagnosis not present

## 2020-08-08 DIAGNOSIS — E7800 Pure hypercholesterolemia, unspecified: Secondary | ICD-10-CM | POA: Diagnosis not present

## 2020-08-08 DIAGNOSIS — I482 Chronic atrial fibrillation, unspecified: Secondary | ICD-10-CM | POA: Diagnosis not present

## 2020-08-08 DIAGNOSIS — R079 Chest pain, unspecified: Secondary | ICD-10-CM | POA: Diagnosis not present

## 2020-08-08 DIAGNOSIS — Z1389 Encounter for screening for other disorder: Secondary | ICD-10-CM | POA: Diagnosis not present

## 2020-08-08 DIAGNOSIS — I700 Atherosclerosis of aorta: Secondary | ICD-10-CM | POA: Diagnosis not present

## 2020-08-08 DIAGNOSIS — Z79899 Other long term (current) drug therapy: Secondary | ICD-10-CM | POA: Diagnosis not present

## 2020-08-08 DIAGNOSIS — I714 Abdominal aortic aneurysm, without rupture: Secondary | ICD-10-CM | POA: Diagnosis not present

## 2020-08-08 DIAGNOSIS — I10 Essential (primary) hypertension: Secondary | ICD-10-CM | POA: Diagnosis not present

## 2020-08-24 DIAGNOSIS — H353223 Exudative age-related macular degeneration, left eye, with inactive scar: Secondary | ICD-10-CM | POA: Diagnosis not present

## 2020-08-27 DIAGNOSIS — R339 Retention of urine, unspecified: Secondary | ICD-10-CM | POA: Diagnosis not present

## 2020-09-05 ENCOUNTER — Telehealth: Payer: Self-pay | Admitting: Cardiology

## 2020-09-05 NOTE — Telephone Encounter (Signed)
Spoke with [place] at Dr. Felipa Eth' office regarding the order we received  requesting this patient have a CTA chest/aorta.  Informed her she can schedule this testing at [facility] or [facility].  She voiced her understanding and states she will schedule at [facility]

## 2020-09-10 ENCOUNTER — Other Ambulatory Visit: Payer: Self-pay | Admitting: Geriatric Medicine

## 2020-09-10 DIAGNOSIS — R079 Chest pain, unspecified: Secondary | ICD-10-CM

## 2020-09-14 ENCOUNTER — Encounter: Payer: Self-pay | Admitting: Podiatry

## 2020-09-14 ENCOUNTER — Ambulatory Visit: Payer: PPO | Admitting: Podiatry

## 2020-09-14 ENCOUNTER — Other Ambulatory Visit: Payer: Self-pay

## 2020-09-14 DIAGNOSIS — B351 Tinea unguium: Secondary | ICD-10-CM

## 2020-09-14 DIAGNOSIS — D689 Coagulation defect, unspecified: Secondary | ICD-10-CM

## 2020-09-14 DIAGNOSIS — M79676 Pain in unspecified toe(s): Secondary | ICD-10-CM | POA: Diagnosis not present

## 2020-09-14 NOTE — Progress Notes (Signed)
This patient returns to my office for at risk foot care.  This patient requires this care by a professional since this patient will be at risk due to having coagulation defect.  Patient is taking eliquiss.  This patient is unable to cut nails himself since the patient cannot reach his nails.These nails are painful walking and wearing shoes.  This patient presents for at risk foot care today.   Patient is taking eliquiss.  General Appearance  Alert, conversant and in no acute stress.  Vascular  Dorsalis pedis and posterior tibial  pulses are palpable  bilaterally.  Capillary return is within normal limits  bilaterally. Temperature is within normal limits  bilaterally.  Neurologic  Senn-Weinstein monofilament wire test within normal limits  bilaterally. Muscle power within normal limits bilaterally.  Nails Thick disfigured discolored nails with subungual debris  from hallux to fifth toes bilaterally. No evidence of bacterial infection or drainage bilaterally.  Orthopedic  No limitations of motion  feet .  No crepitus or effusions noted.  No bony pathology or digital deformities noted.  DJD 1st MPJ  B/L.  Skin  normotropic skin with no porokeratosis noted bilaterally.  No signs of infections or ulcers noted.     Onychomycosis  Pain in right toes  Pain in left toes  Consent was obtained for treatment procedures.   Mechanical debridement of nails 1-5  bilaterally performed with a nail nipper.  Filed with dremel without incident.    Return office visit     4 months                Told patient to return for periodic foot care and evaluation due to potential at risk complications.   Madison Albea DPM  

## 2020-09-20 DIAGNOSIS — I482 Chronic atrial fibrillation, unspecified: Secondary | ICD-10-CM | POA: Diagnosis not present

## 2020-09-20 DIAGNOSIS — E7800 Pure hypercholesterolemia, unspecified: Secondary | ICD-10-CM | POA: Diagnosis not present

## 2020-09-20 DIAGNOSIS — I5032 Chronic diastolic (congestive) heart failure: Secondary | ICD-10-CM | POA: Diagnosis not present

## 2020-09-20 DIAGNOSIS — I10 Essential (primary) hypertension: Secondary | ICD-10-CM | POA: Diagnosis not present

## 2020-09-26 DIAGNOSIS — N401 Enlarged prostate with lower urinary tract symptoms: Secondary | ICD-10-CM | POA: Diagnosis not present

## 2020-09-26 DIAGNOSIS — R3912 Poor urinary stream: Secondary | ICD-10-CM | POA: Diagnosis not present

## 2020-09-26 DIAGNOSIS — N312 Flaccid neuropathic bladder, not elsewhere classified: Secondary | ICD-10-CM | POA: Diagnosis not present

## 2020-09-28 ENCOUNTER — Ambulatory Visit
Admission: RE | Admit: 2020-09-28 | Discharge: 2020-09-28 | Disposition: A | Discharge status: Home / Self Care | Payer: PPO | Source: Ambulatory Visit | Attending: Geriatric Medicine | Admitting: Geriatric Medicine

## 2020-09-28 DIAGNOSIS — R200 Anesthesia of skin: Secondary | ICD-10-CM | POA: Diagnosis not present

## 2020-09-28 DIAGNOSIS — I2510 Atherosclerotic heart disease of native coronary artery without angina pectoris: Secondary | ICD-10-CM | POA: Diagnosis not present

## 2020-09-28 DIAGNOSIS — M25512 Pain in left shoulder: Secondary | ICD-10-CM | POA: Diagnosis not present

## 2020-09-28 DIAGNOSIS — J432 Centrilobular emphysema: Secondary | ICD-10-CM | POA: Diagnosis not present

## 2020-09-28 DIAGNOSIS — R079 Chest pain, unspecified: Secondary | ICD-10-CM

## 2020-09-28 MED ORDER — IOPAMIDOL (ISOVUE-370) INJECTION 76%
75.0000 mL | Freq: Once | INTRAVENOUS | Status: AC | PRN
  Administered 2020-09-28: 75 mL via INTRAVENOUS

## 2020-10-03 DIAGNOSIS — R339 Retention of urine, unspecified: Secondary | ICD-10-CM | POA: Diagnosis not present

## 2020-10-05 DIAGNOSIS — H353223 Exudative age-related macular degeneration, left eye, with inactive scar: Secondary | ICD-10-CM | POA: Diagnosis not present

## 2020-10-18 DIAGNOSIS — I5032 Chronic diastolic (congestive) heart failure: Secondary | ICD-10-CM | POA: Diagnosis not present

## 2020-10-18 DIAGNOSIS — E7800 Pure hypercholesterolemia, unspecified: Secondary | ICD-10-CM | POA: Diagnosis not present

## 2020-10-18 DIAGNOSIS — I10 Essential (primary) hypertension: Secondary | ICD-10-CM | POA: Diagnosis not present

## 2020-10-18 DIAGNOSIS — I482 Chronic atrial fibrillation, unspecified: Secondary | ICD-10-CM | POA: Diagnosis not present
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
## 2020-10-30 ENCOUNTER — Other Ambulatory Visit: Payer: Self-pay

## 2020-10-30 ENCOUNTER — Other Ambulatory Visit: Payer: Self-pay | Admitting: Physician Assistant

## 2020-10-30 ENCOUNTER — Ambulatory Visit (HOSPITAL_COMMUNITY)
Admission: RE | Admit: 2020-10-30 | Discharge: 2020-10-30 | Disposition: A | Discharge status: Home / Self Care | Payer: PPO | Source: Ambulatory Visit | Attending: Vascular Surgery | Admitting: Vascular Surgery

## 2020-10-30 ENCOUNTER — Encounter: Payer: Self-pay | Admitting: Vascular Surgery

## 2020-10-30 ENCOUNTER — Ambulatory Visit: Payer: PPO | Admitting: Vascular Surgery

## 2020-10-30 VITALS — BP 114/55 | HR 83 | Temp 98.3°F | Resp 20 | Ht 71.0 in | Wt 181.0 lb

## 2020-10-30 DIAGNOSIS — I714 Abdominal aortic aneurysm, without rupture, unspecified: Secondary | ICD-10-CM

## 2020-10-31 ENCOUNTER — Other Ambulatory Visit: Payer: Self-pay

## 2020-10-31 DIAGNOSIS — I714 Abdominal aortic aneurysm, without rupture, unspecified: Secondary | ICD-10-CM

## 2020-11-07 DIAGNOSIS — D692 Other nonthrombocytopenic purpura: Secondary | ICD-10-CM | POA: Diagnosis not present

## 2020-11-07 DIAGNOSIS — Z85828 Personal history of other malignant neoplasm of skin: Secondary | ICD-10-CM | POA: Diagnosis not present

## 2020-11-07 DIAGNOSIS — L821 Other seborrheic keratosis: Secondary | ICD-10-CM | POA: Diagnosis not present

## 2020-11-07 DIAGNOSIS — L570 Actinic keratosis: Secondary | ICD-10-CM | POA: Diagnosis not present

## 2020-11-20 DIAGNOSIS — E7800 Pure hypercholesterolemia, unspecified: Secondary | ICD-10-CM | POA: Diagnosis not present

## 2020-11-20 DIAGNOSIS — I5032 Chronic diastolic (congestive) heart failure: Secondary | ICD-10-CM | POA: Diagnosis not present

## 2020-11-20 DIAGNOSIS — I482 Chronic atrial fibrillation, unspecified: Secondary | ICD-10-CM | POA: Diagnosis not present

## 2020-11-20 DIAGNOSIS — I10 Essential (primary) hypertension: Secondary | ICD-10-CM | POA: Diagnosis not present

## 2020-11-23 DIAGNOSIS — R339 Retention of urine, unspecified: Secondary | ICD-10-CM | POA: Diagnosis not present

## 2020-11-23 DIAGNOSIS — H353223 Exudative age-related macular degeneration, left eye, with inactive scar: Secondary | ICD-10-CM | POA: Diagnosis not present

## 2020-11-30 ENCOUNTER — Other Ambulatory Visit: Payer: Self-pay

## 2020-11-30 ENCOUNTER — Ambulatory Visit: Payer: PPO | Admitting: Cardiology

## 2020-11-30 ENCOUNTER — Encounter: Payer: Self-pay | Admitting: Cardiology

## 2020-11-30 VITALS — BP 120/60 | HR 59 | Ht 71.0 in | Wt 182.4 lb

## 2020-11-30 DIAGNOSIS — I25119 Atherosclerotic heart disease of native coronary artery with unspecified angina pectoris: Secondary | ICD-10-CM | POA: Diagnosis not present

## 2020-11-30 DIAGNOSIS — I5032 Chronic diastolic (congestive) heart failure: Secondary | ICD-10-CM

## 2020-11-30 DIAGNOSIS — I4901 Ventricular fibrillation: Secondary | ICD-10-CM

## 2020-11-30 DIAGNOSIS — I2109 ST elevation (STEMI) myocardial infarction involving other coronary artery of anterior wall: Secondary | ICD-10-CM

## 2020-11-30 DIAGNOSIS — R030 Elevated blood-pressure reading, without diagnosis of hypertension: Secondary | ICD-10-CM | POA: Diagnosis not present

## 2020-11-30 DIAGNOSIS — I714 Abdominal aortic aneurysm, without rupture, unspecified: Secondary | ICD-10-CM

## 2020-11-30 DIAGNOSIS — I4819 Other persistent atrial fibrillation: Secondary | ICD-10-CM

## 2020-11-30 DIAGNOSIS — I519 Heart disease, unspecified: Secondary | ICD-10-CM

## 2020-11-30 DIAGNOSIS — E785 Hyperlipidemia, unspecified: Secondary | ICD-10-CM

## 2020-11-30 DIAGNOSIS — Z7901 Long term (current) use of anticoagulants: Secondary | ICD-10-CM | POA: Diagnosis not present

## 2020-11-30 DIAGNOSIS — I2510 Atherosclerotic heart disease of native coronary artery without angina pectoris: Secondary | ICD-10-CM
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
## 2020-11-30 NOTE — Patient Instructions (Signed)
Medication Instructions:  No changes   *If you need a refill on your cardiac medications before your next appointment, please call your pharmacy*   Lab Work: No changes  If you have labs (blood work) drawn today and your tests are completely normal, you will receive your results only by: [place] (if you have MyChart) OR A paper copy in the mail If you have any lab test that is abnormal or we need to change your treatment, we will call you to review the results.   Testing/Procedures: Not needed   Follow-Up: At [facility], you and your health needs are our priority.  As part of our continuing mission to provide you with exceptional heart care, we have created designated Provider Care Teams.  These Care Teams include your primary Cardiologist (physician) and Advanced Practice Providers (APPs -  Physician Assistants and Nurse Practitioners) who all work together to provide you with the care you need, when you need it.     Your next appointment:   12 month(s)  The format for your next appointment:   In Person  Provider:   Glenetta Hew, MD

## 2020-12-13 ENCOUNTER — Other Ambulatory Visit: Payer: Self-pay | Admitting: Cardiology

## 2020-12-13 NOTE — Telephone Encounter (Signed)
Prescription refill request for Eliquis received. Indication:AFIB Last office [place] 11/30/20 Scr:1.320 mg/ 08/08/2020 Age: 45M Weight:82.7KG

## 2020-12-17 ENCOUNTER — Encounter: Payer: Self-pay | Admitting: Cardiology

## 2020-12-17 NOTE — Assessment & Plan Note (Signed)
Truthfully not hypertension-was intolerant of beta-blocker and ARB in the past.  He is tolerating low-dose diltiazem with well-controlled pressures.

## 2020-12-17 NOTE — Assessment & Plan Note (Signed)
Most recent follow-up labs look great on moderate dose atorvastatin. Labs followed by PCP.  No change.

## 2020-12-17 NOTE — Assessment & Plan Note (Signed)
Followed closely by vascular surgery.  See note above.

## 2020-12-17 NOTE — Assessment & Plan Note (Signed)
Thankfully, his EF improved up to 50% on follow-up echocardiogram.  Now having only NYHA class I symptoms with mild edema.  No PND or orthopnea.  This seems to be his major symptom associated with A. fib.  He is no longer even on standing dose or PRN dose of Lasix. Denies PND, or orthopnea.  Euvolemic on exam with exception of Trivial edema controlled with foot elevation.  Would prefer to avoid standing diuretic.  With preserved EF, we have opted to use diltiazem over beta-blocker to avoid fatigue and hypotension.  Did not tolerate beta-blocker or ARB in the past due to hypotension and fatigue.

## 2020-12-17 NOTE — Assessment & Plan Note (Signed)
Large anterior MI complicated by VT/VF just about 8 years ago.  After initially having cardiomyopathy with reduced EF, his EF improved with revascularization to almost normal at 50 to 55%.  Myoview only showed a small residual apical infarct.  No evidence of ischemia.  Blood pressure has not allowed [place] to use standard therapy with beta-blocker or ARB.  Is tolerating low-dose diltiazem for rate control which is acceptable with normal EF.

## 2020-12-17 NOTE — Assessment & Plan Note (Signed)
Paroxysmal, persistent A. fib -> has not had a breakthrough episode since at least August of last year.  He seems to be tolerating the 180 mg diltiazem now.  Not having any issues with fatigue and no significant bradycardia.  Tolerating Eliquis.  No major issues.

## 2020-12-17 NOTE — Assessment & Plan Note (Signed)
Factors for CHA2DS2-VASc score are age rater than 5, and CAD.  He does have a history of CHF, but not active.  Regardless, would be at elevated risk for stroke, therefore on Eliquis.  On stable dose.  No bleeding issues.   Would be okay to hold Eliquis 48 to 72 hours preop for surgeries or procedures-contact office for advice, but usually 72 hours for more high risk procedures and 48 for other lower risk.

## 2020-12-17 NOTE — Assessment & Plan Note (Signed)
Has not had any further anginal episodes.  We evaluated the chest pain he had during A. fib with a Myoview that was nonischemic.  Only a small fixed defect.  As mentioned, not able to tolerate beta-blocker-with preserved EF on diltiazem. On stable moderate dose atorvastatin with excellent lipid control. Not on aspirin or Thienopyridine with him being on Eliquis now.

## 2020-12-17 NOTE — Assessment & Plan Note (Signed)
Ischemic ventricular fibrillation while in route to the ER in setting of anterior STEMI.  No recurrence post MI.  No anterior infarct on either echo or Myoview.

## 2020-12-19 DIAGNOSIS — I5032 Chronic diastolic (congestive) heart failure: Secondary | ICD-10-CM | POA: Diagnosis not present

## 2020-12-19 DIAGNOSIS — I482 Chronic atrial fibrillation, unspecified: Secondary | ICD-10-CM | POA: Diagnosis not present

## 2020-12-19 DIAGNOSIS — E7800 Pure hypercholesterolemia, unspecified: Secondary | ICD-10-CM | POA: Diagnosis not present

## 2020-12-19 DIAGNOSIS — I10 Essential (primary) hypertension: Secondary | ICD-10-CM | POA: Diagnosis not present

## 2020-12-22 DIAGNOSIS — R339 Retention of urine, unspecified: Secondary | ICD-10-CM | POA: Diagnosis not present

## 2020-12-24 DIAGNOSIS — Z23 Encounter for immunization: Secondary | ICD-10-CM | POA: Diagnosis not present

## 2021-01-03 IMAGING — CT CT CTA ABD/PEL W/CM AND/OR W/O CM
1 of 4 series · 13 of 32 positions shown, 18 images · IV contrast (APPLIED)
Comparison: 04/28/2014

CLINICAL DATA: Aortic aneurysm, follow-up

EXAM:
CTA ABDOMEN AND PELVIS WITH CONTRAST
TECHNIQUE: Multidetector CT imaging of the abdomen and pelvis was performed
using the standard protocol during bolus administration of
intravenous contrast. Multiplanar reconstructed images and MIPs were
obtained and reviewed to evaluate the vascular anatomy.
CONTRAST:  60mL [id number] IOPAMIDOL (0XGS35-XFR) INJECTION 76%

[Series 5: pre stent angio · axial · non-contrast · 0.77mm/px · z∈[+64,+461]mm · 13 of 447 slices shown, 18 images]
[im 25/447  soft-tissue]
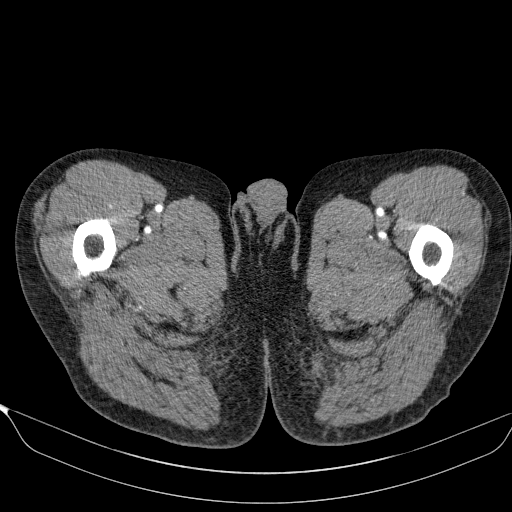
[im 25/447  bone]
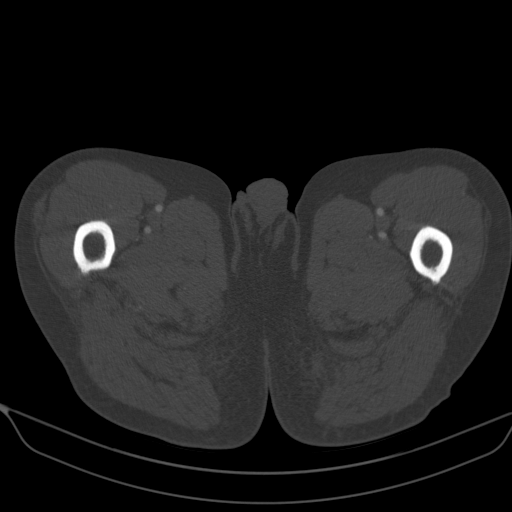
[im 75/447  soft-tissue]
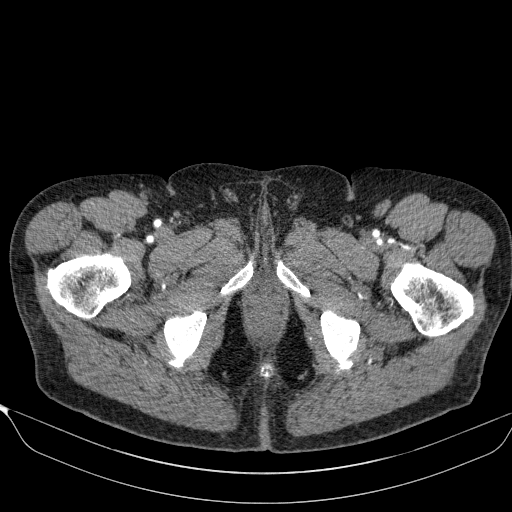
[im 100/447  soft-tissue]
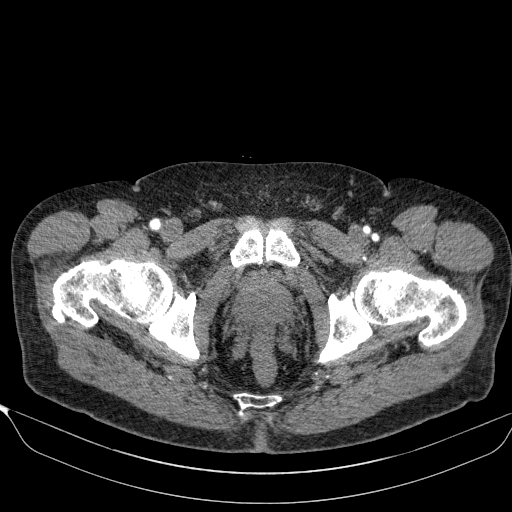
[im 124/447  soft-tissue]
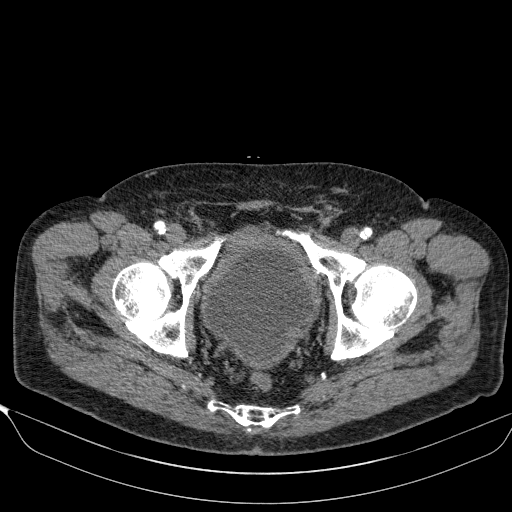
[im 174/447  soft-tissue]
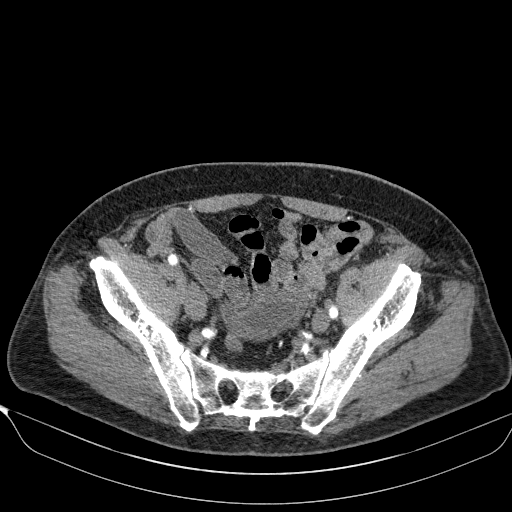
[im 199/447  soft-tissue]
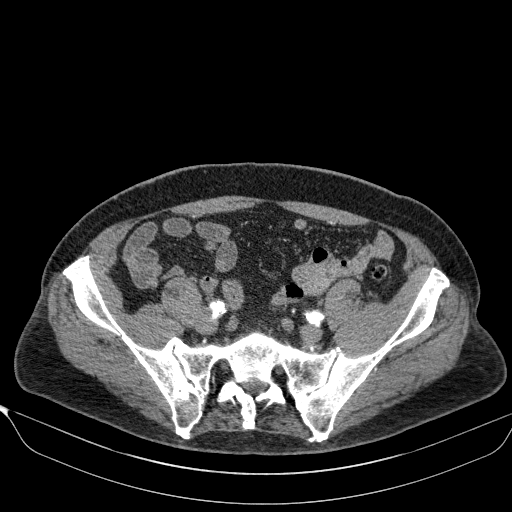
[im 248/447  soft-tissue]
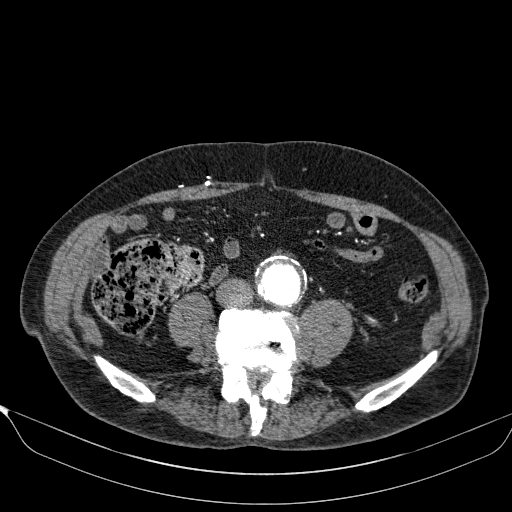
[im 273/447  soft-tissue]
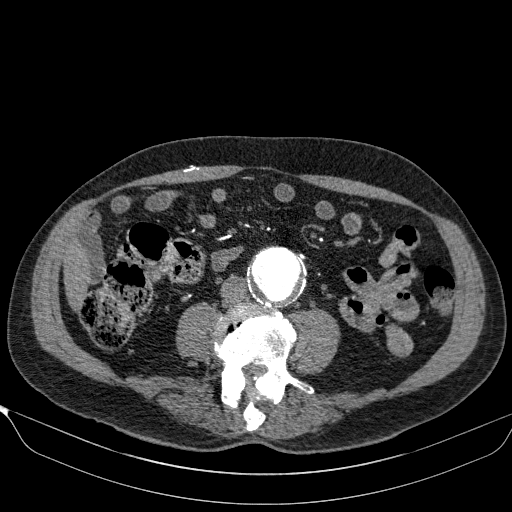
[im 323/447  soft-tissue]
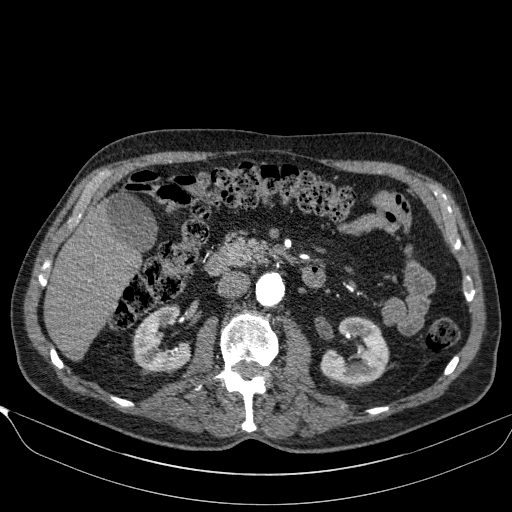
[im 323/447  bone]
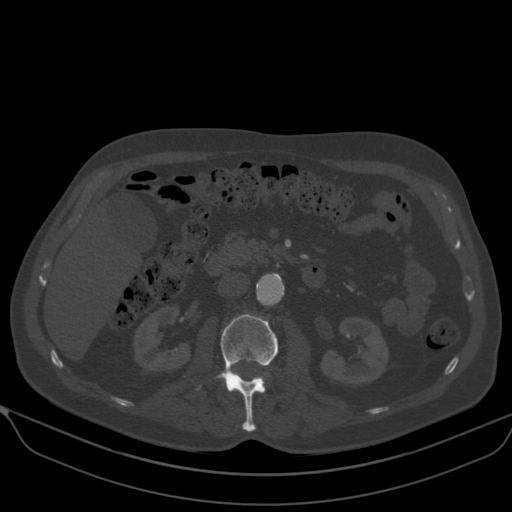
[im 347/447  soft-tissue]
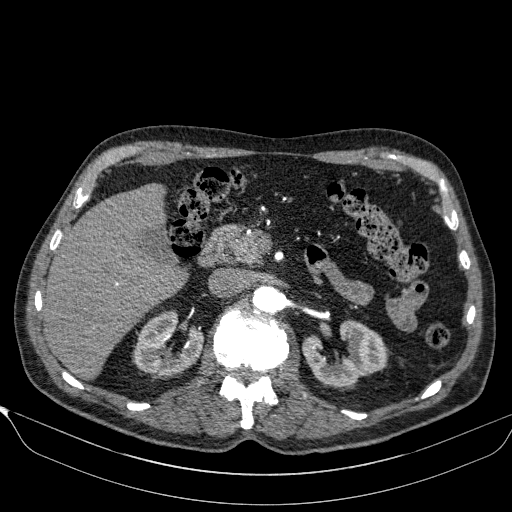
[im 347/447  lung]
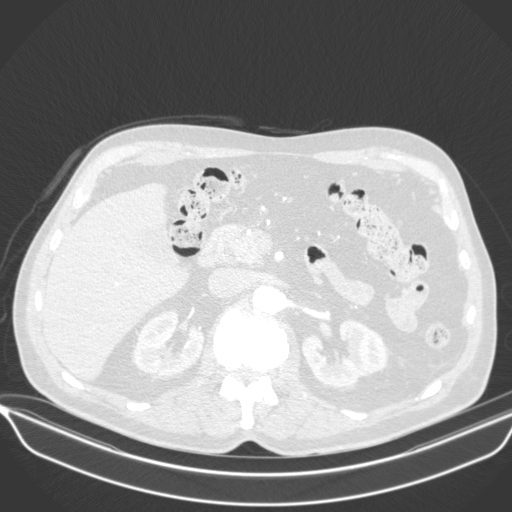
[im 372/447  soft-tissue]
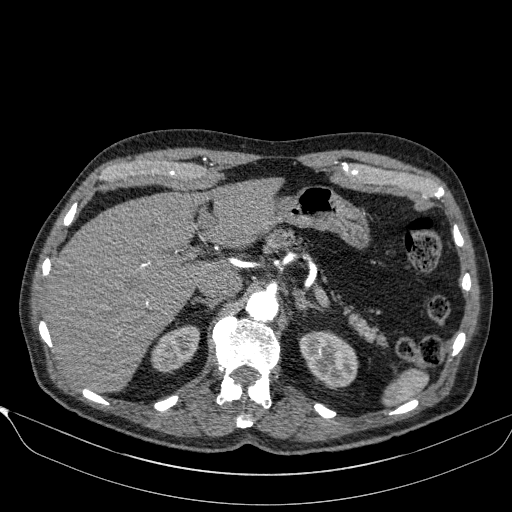
[im 372/447  lung]
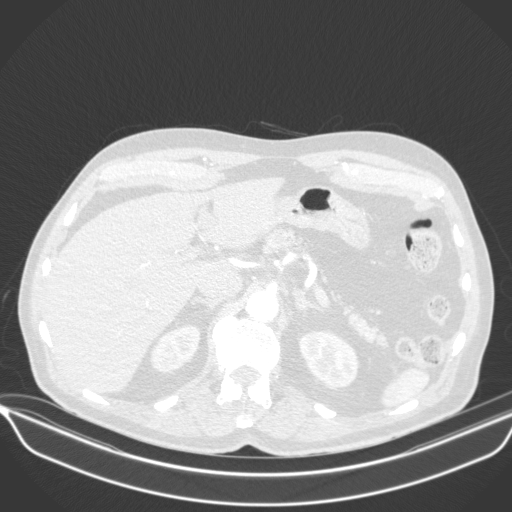
[im 397/447  lung]
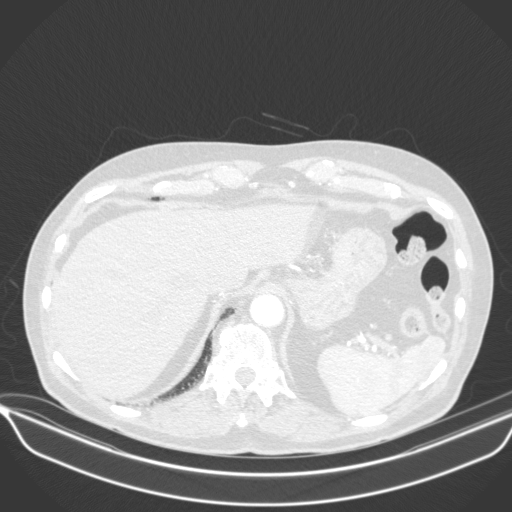
[im 422/447  soft-tissue]
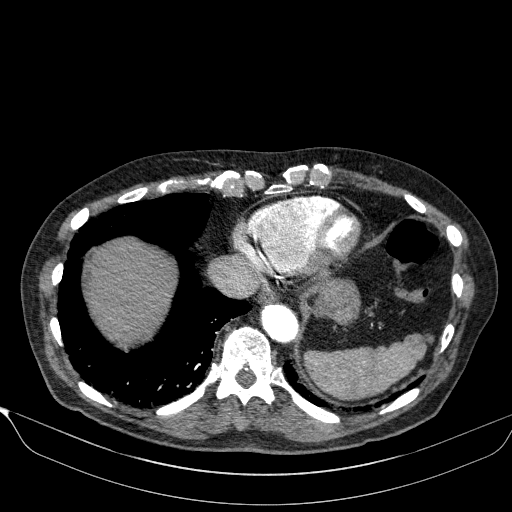
[im 422/447  lung]
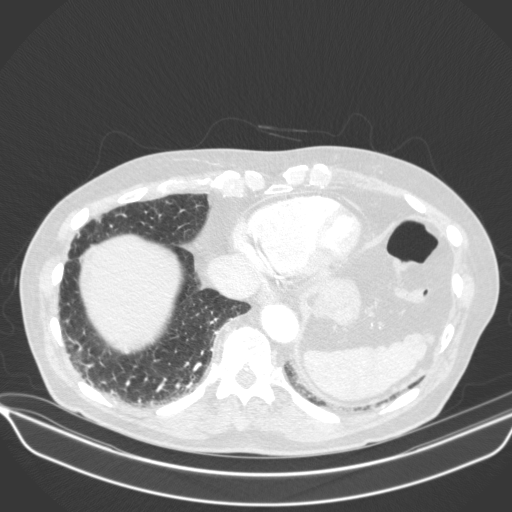

[13 of 32 positions shown; findings below may reference images not displayed]

FINDINGS: VASCULAR

Aorta: Fusiform infrarenal aneurysm 4.9 x 4.9 cm (previously
cm). Small amount of nonocclusive mural thrombus in the aneurysmal
segment. Extensive calcified atheromatous plaque. No dissection or
stenosis.

Celiac: Patent without evidence of aneurysm, dissection, vasculitis
or significant stenosis.

SMA: Patent without evidence of aneurysm, dissection, vasculitis or
significant stenosis.

Renals: Single left, with early bifurcation, patent. Duplicated
right, inferior dominant, both patent.

IMA: Patent without evidence of aneurysm, dissection, vasculitis or
significant stenosis.

Inflow: Right common iliac ectatic up to 1.4 cm. Moderate scattered
calcified plaque in bilateral iliac arterial system without aneurysm
or stenosis.

Proximal Outflow: Bilateral common femoral and visualized portions
of the superficial and profunda femoral arteries are patent without
evidence of aneurysm, dissection, vasculitis or significant
stenosis.

Veins: Patent hepatic veins, portal vein, SMV, splenic vein,
bilateral renal veins. Iliac venous system and IVC unremarkable. No
venous pathology evident.

Review of the MIP images confirms the above findings.

NON-VASCULAR

Lower chest: No pleural or pericardial effusion.

Hepatobiliary: No focal liver abnormality is seen. No gallstones,
gallbladder wall thickening, or biliary dilatation.

Pancreas: Unremarkable. No pancreatic ductal dilatation or
surrounding inflammatory changes.

Spleen: Normal in size without focal abnormality.

Adrenals/Urinary Tract: Adrenal glands unremarkable. Kidneys
symmetric without hydronephrosis or focal lesion. Urinary bladder
physiologically distended, moderately thick-walled.

Stomach/Bowel: Stomach is decompressed. Small bowel is nondistended.
Appendix surgically absent. The colon is nondilated. Multiple distal
descending and sigmoid diverticula.

Lymphatic: No abdominal or pelvic adenopathy.

Reproductive: Prostate enlargement with TURP defect.

Other: No ascites.  No free air.

Musculoskeletal: Multilevel lumbar spondylitic change. Negative for
fracture or worrisome bone lesion.
IMPRESSION: 1. 4.9 cm infrarenal abdominal aortic aneurysm (previously 4.5 cm).
Recommend follow-up every 6 months and vascular consultation.
2. Descending and sigmoid diverticulosis.

## 2021-01-09 DIAGNOSIS — H353223 Exudative age-related macular degeneration, left eye, with inactive scar: Secondary | ICD-10-CM | POA: Diagnosis not present

## 2021-01-11 ENCOUNTER — Encounter: Payer: Self-pay | Admitting: Podiatry

## 2021-01-11 ENCOUNTER — Ambulatory Visit: Payer: PPO | Admitting: Podiatry

## 2021-01-11 ENCOUNTER — Other Ambulatory Visit: Payer: Self-pay

## 2021-01-11 DIAGNOSIS — M79676 Pain in unspecified toe(s): Secondary | ICD-10-CM

## 2021-01-11 DIAGNOSIS — B351 Tinea unguium: Secondary | ICD-10-CM

## 2021-01-11 DIAGNOSIS — M129 Arthropathy, unspecified: Secondary | ICD-10-CM

## 2021-01-11 DIAGNOSIS — D689 Coagulation defect, unspecified: Secondary | ICD-10-CM | POA: Diagnosis not present

## 2021-01-11 NOTE — Progress Notes (Signed)
This patient returns to my office for at risk foot care.  This patient requires this care by a professional since this patient will be at risk due to having coagulation defect.  Patient is taking eliquiss.  This patient is unable to cut nails himself since the patient cannot reach his nails.These nails are painful walking and wearing shoes.  This patient presents for at risk foot care today.   Patient is taking eliquiss.  General Appearance  Alert, conversant and in no acute stress.  Vascular  Dorsalis pedis and posterior tibial  pulses are palpable  bilaterally.  Capillary return is within normal limits  bilaterally. Temperature is within normal limits  bilaterally.  Neurologic  Senn-Weinstein monofilament wire test within normal limits  bilaterally. Muscle power within normal limits bilaterally.  Nails Thick disfigured discolored nails with subungual debris  from hallux to fifth toes bilaterally. No evidence of bacterial infection or drainage bilaterally.  Orthopedic  No limitations of motion  feet .  No crepitus or effusions noted.  No bony pathology or digital deformities noted.  DJD 1st MPJ  B/L.  Skin  normotropic skin with no porokeratosis noted bilaterally.  No signs of infections or ulcers noted.     Onychomycosis  Pain in right toes  Pain in left toes  Consent was obtained for treatment procedures.   Mechanical debridement of nails 1-5  bilaterally performed with a nail nipper.  Filed with dremel without incident.    Return office visit     4 months                Told patient to return for periodic foot care and evaluation due to potential at risk complications.   Arael Piccione DPM  

## 2021-01-14 DIAGNOSIS — I482 Chronic atrial fibrillation, unspecified: Secondary | ICD-10-CM | POA: Diagnosis not present

## 2021-01-14 DIAGNOSIS — R040 Epistaxis: Secondary | ICD-10-CM | POA: Diagnosis not present

## 2021-01-14 DIAGNOSIS — R0789 Other chest pain: Secondary | ICD-10-CM | POA: Diagnosis not present

## 2021-01-14 DIAGNOSIS — I10 Essential (primary) hypertension: Secondary | ICD-10-CM | POA: Diagnosis not present

## 2021-01-17 DIAGNOSIS — I5032 Chronic diastolic (congestive) heart failure: Secondary | ICD-10-CM | POA: Diagnosis not present

## 2021-01-17 DIAGNOSIS — I482 Chronic atrial fibrillation, unspecified: Secondary | ICD-10-CM | POA: Diagnosis not present

## 2021-01-17 DIAGNOSIS — E7800 Pure hypercholesterolemia, unspecified: Secondary | ICD-10-CM | POA: Diagnosis not present

## 2021-01-17 DIAGNOSIS — I10 Essential (primary) hypertension: Secondary | ICD-10-CM | POA: Diagnosis not present

## 2021-01-22 DIAGNOSIS — R339 Retention of urine, unspecified: Secondary | ICD-10-CM | POA: Diagnosis not present

## 2021-02-06 DIAGNOSIS — I10 Essential (primary) hypertension: Secondary | ICD-10-CM | POA: Diagnosis not present

## 2021-02-06 DIAGNOSIS — I482 Chronic atrial fibrillation, unspecified: Secondary | ICD-10-CM | POA: Diagnosis not present

## 2021-02-06 DIAGNOSIS — Z23 Encounter for immunization: Secondary | ICD-10-CM | POA: Diagnosis not present

## 2021-02-06 DIAGNOSIS — D6869 Other thrombophilia: Secondary | ICD-10-CM | POA: Diagnosis not present

## 2021-02-06 DIAGNOSIS — R040 Epistaxis: Secondary | ICD-10-CM | POA: Diagnosis not present

## 2021-02-06 DIAGNOSIS — I5032 Chronic diastolic (congestive) heart failure: Secondary | ICD-10-CM | POA: Diagnosis not present

## 2021-02-07 DIAGNOSIS — I714 Abdominal aortic aneurysm, without rupture, unspecified: Secondary | ICD-10-CM | POA: Diagnosis not present

## 2021-02-07 DIAGNOSIS — I2510 Atherosclerotic heart disease of native coronary artery without angina pectoris: Secondary | ICD-10-CM | POA: Diagnosis not present

## 2021-02-07 DIAGNOSIS — I5030 Unspecified diastolic (congestive) heart failure: Secondary | ICD-10-CM | POA: Diagnosis not present

## 2021-02-07 DIAGNOSIS — N1831 Chronic kidney disease, stage 3a: Secondary | ICD-10-CM | POA: Diagnosis not present

## 2021-02-18 DIAGNOSIS — I10 Essential (primary) hypertension: Secondary | ICD-10-CM | POA: Diagnosis not present

## 2021-02-18 DIAGNOSIS — I482 Chronic atrial fibrillation, unspecified: Secondary | ICD-10-CM | POA: Diagnosis not present

## 2021-02-18 DIAGNOSIS — E7800 Pure hypercholesterolemia, unspecified: Secondary | ICD-10-CM | POA: Diagnosis not present

## 2021-02-18 DIAGNOSIS — I5032 Chronic diastolic (congestive) heart failure: Secondary | ICD-10-CM | POA: Diagnosis not present

## 2021-02-28 DIAGNOSIS — H353223 Exudative age-related macular degeneration, left eye, with inactive scar: Secondary | ICD-10-CM | POA: Diagnosis not present

## 2021-03-20 DIAGNOSIS — I482 Chronic atrial fibrillation, unspecified: Secondary | ICD-10-CM | POA: Diagnosis not present

## 2021-03-20 DIAGNOSIS — I10 Essential (primary) hypertension: Secondary | ICD-10-CM | POA: Diagnosis not present

## 2021-03-20 DIAGNOSIS — I5032 Chronic diastolic (congestive) heart failure: Secondary | ICD-10-CM | POA: Diagnosis not present

## 2021-03-20 DIAGNOSIS — E7800 Pure hypercholesterolemia, unspecified: Secondary | ICD-10-CM | POA: Diagnosis not present

## 2021-04-15 DIAGNOSIS — H353223 Exudative age-related macular degeneration, left eye, with inactive scar: Secondary | ICD-10-CM | POA: Diagnosis not present

## 2021-04-15 DIAGNOSIS — H353112 Nonexudative age-related macular degeneration, right eye, intermediate dry stage: Secondary | ICD-10-CM | POA: Diagnosis not present

## 2021-04-15 DIAGNOSIS — H53412 Scotoma involving central area, left eye: Secondary | ICD-10-CM | POA: Diagnosis not present

## 2021-04-15 DIAGNOSIS — H35363 Drusen (degenerative) of macula, bilateral: Secondary | ICD-10-CM | POA: Diagnosis not present

## 2021-04-15 DIAGNOSIS — Z961 Presence of intraocular lens: Secondary | ICD-10-CM | POA: Diagnosis not present

## 2021-04-15 DIAGNOSIS — H35453 Secondary pigmentary degeneration, bilateral: Secondary | ICD-10-CM | POA: Diagnosis not present

## 2021-04-16 DIAGNOSIS — I10 Essential (primary) hypertension: Secondary | ICD-10-CM | POA: Diagnosis not present

## 2021-04-16 DIAGNOSIS — E7800 Pure hypercholesterolemia, unspecified: Secondary | ICD-10-CM | POA: Diagnosis not present

## 2021-04-16 DIAGNOSIS — I482 Chronic atrial fibrillation, unspecified: Secondary | ICD-10-CM | POA: Diagnosis not present

## 2021-04-16 DIAGNOSIS — I5032 Chronic diastolic (congestive) heart failure: Secondary | ICD-10-CM | POA: Diagnosis not present

## 2021-04-29 DIAGNOSIS — Z6828 Body mass index (BMI) 28.0-28.9, adult: Secondary | ICD-10-CM | POA: Diagnosis not present

## 2021-04-29 DIAGNOSIS — N1831 Chronic kidney disease, stage 3a: Secondary | ICD-10-CM | POA: Diagnosis not present

## 2021-04-29 DIAGNOSIS — Z7901 Long term (current) use of anticoagulants: Secondary | ICD-10-CM | POA: Diagnosis not present

## 2021-04-29 DIAGNOSIS — H353290 Exudative age-related macular degeneration, unspecified eye, stage unspecified: Secondary | ICD-10-CM | POA: Diagnosis not present

## 2021-04-29 DIAGNOSIS — H353223 Exudative age-related macular degeneration, left eye, with inactive scar: Secondary | ICD-10-CM | POA: Diagnosis not present

## 2021-04-29 DIAGNOSIS — I4819 Other persistent atrial fibrillation: Secondary | ICD-10-CM | POA: Diagnosis not present

## 2021-04-29 DIAGNOSIS — I714 Abdominal aortic aneurysm, without rupture, unspecified: Secondary | ICD-10-CM | POA: Diagnosis not present

## 2021-04-29 DIAGNOSIS — D6869 Other thrombophilia: Secondary | ICD-10-CM | POA: Diagnosis not present

## 2021-04-29 DIAGNOSIS — I5032 Chronic diastolic (congestive) heart failure: Secondary | ICD-10-CM | POA: Diagnosis not present

## 2021-04-29 DIAGNOSIS — R339 Retention of urine, unspecified: Secondary | ICD-10-CM | POA: Diagnosis not present

## 2021-04-29 DIAGNOSIS — I2510 Atherosclerotic heart disease of native coronary artery without angina pectoris: Secondary | ICD-10-CM | POA: Diagnosis not present

## 2021-05-13 ENCOUNTER — Ambulatory Visit: Payer: PPO | Admitting: Podiatry

## 2021-05-13 ENCOUNTER — Other Ambulatory Visit: Payer: Self-pay

## 2021-05-13 DIAGNOSIS — M79676 Pain in unspecified toe(s): Secondary | ICD-10-CM

## 2021-05-13 DIAGNOSIS — B351 Tinea unguium: Secondary | ICD-10-CM | POA: Diagnosis not present

## 2021-05-13 DIAGNOSIS — D689 Coagulation defect, unspecified: Secondary | ICD-10-CM

## 2021-05-13 NOTE — Progress Notes (Signed)
This patient returns to my office for at risk foot care.  This patient requires this care by a professional since this patient will be at risk due to having coagulation defect.  Patient is taking eliquiss.  This patient is unable to cut nails himself since the patient cannot reach his nails.These nails are painful walking and wearing shoes.  This patient presents for at risk foot care today.   Patient is taking eliquiss.  General Appearance  Alert, conversant and in no acute stress.  Vascular  Dorsalis pedis and posterior tibial  pulses are palpable  bilaterally.  Capillary return is within normal limits  bilaterally. Temperature is within normal limits  bilaterally.  Neurologic  Senn-Weinstein monofilament wire test within normal limits  bilaterally. Muscle power within normal limits bilaterally.  Nails Thick disfigured discolored nails with subungual debris  from hallux to fifth toes bilaterally. No evidence of bacterial infection or drainage bilaterally.  Orthopedic  No limitations of motion  feet .  No crepitus or effusions noted.  No bony pathology or digital deformities noted.  DJD 1st MPJ  B/L.  Skin  normotropic skin with no porokeratosis noted bilaterally.  No signs of infections or ulcers noted.     Onychomycosis  Pain in right toes  Pain in left toes  Consent was obtained for treatment procedures.   Mechanical debridement of nails 1-5  bilaterally performed with a nail nipper.  Filed with dremel without incident.    Return office visit     4 months                Told patient to return for periodic foot care and evaluation due to potential at risk complications.   [name] DPM  

## 2021-05-14 ENCOUNTER — Other Ambulatory Visit: Payer: Self-pay | Admitting: Cardiology
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
## 2021-06-04 ENCOUNTER — Other Ambulatory Visit: Payer: Self-pay

## 2021-06-04 ENCOUNTER — Ambulatory Visit (HOSPITAL_COMMUNITY)
Admission: RE | Admit: 2021-06-04 | Discharge: 2021-06-04 | Disposition: A | Discharge status: Home / Self Care | Payer: PPO | Source: Ambulatory Visit | Attending: Vascular Surgery | Admitting: Vascular Surgery

## 2021-06-04 ENCOUNTER — Encounter: Payer: Self-pay | Admitting: Physician Assistant

## 2021-06-04 ENCOUNTER — Ambulatory Visit: Payer: PPO | Admitting: Physician Assistant

## 2021-06-04 VITALS — BP 107/58 | HR 65 | Temp 97.8°F | Resp 18 | Ht 71.0 in | Wt 186.0 lb

## 2021-06-04 DIAGNOSIS — I714 Abdominal aortic aneurysm, without rupture, unspecified: Secondary | ICD-10-CM | POA: Insufficient documentation

## 2021-06-07 ENCOUNTER — Other Ambulatory Visit: Payer: Self-pay | Admitting: *Deleted

## 2021-06-07 DIAGNOSIS — I714 Abdominal aortic aneurysm, without rupture, unspecified: Secondary | ICD-10-CM

## 2021-06-10 DIAGNOSIS — H353223 Exudative age-related macular degeneration, left eye, with inactive scar: Secondary | ICD-10-CM | POA: Diagnosis not present

## 2021-06-14 DIAGNOSIS — I10 Essential (primary) hypertension: Secondary | ICD-10-CM | POA: Diagnosis not present

## 2021-06-14 DIAGNOSIS — E7800 Pure hypercholesterolemia, unspecified: Secondary | ICD-10-CM | POA: Diagnosis not present

## 2021-06-14 DIAGNOSIS — I4819 Other persistent atrial fibrillation: Secondary | ICD-10-CM | POA: Diagnosis not present

## 2021-06-14 DIAGNOSIS — I5032 Chronic diastolic (congestive) heart failure: Secondary | ICD-10-CM | POA: Diagnosis not present

## 2021-07-15 DIAGNOSIS — R339 Retention of urine, unspecified: Secondary | ICD-10-CM | POA: Diagnosis not present

## 2021-07-22 DIAGNOSIS — H353223 Exudative age-related macular degeneration, left eye, with inactive scar: Secondary | ICD-10-CM | POA: Diagnosis not present

## 2021-08-02 DIAGNOSIS — I714 Abdominal aortic aneurysm, without rupture, unspecified: Secondary | ICD-10-CM | POA: Diagnosis not present

## 2021-08-08 DIAGNOSIS — R339 Retention of urine, unspecified: Secondary | ICD-10-CM | POA: Diagnosis not present

## 2021-08-15 ENCOUNTER — Telehealth: Payer: Self-pay | Admitting: Cardiology

## 2021-08-15 DIAGNOSIS — I10 Essential (primary) hypertension: Secondary | ICD-10-CM | POA: Diagnosis not present

## 2021-08-15 DIAGNOSIS — I4819 Other persistent atrial fibrillation: Secondary | ICD-10-CM | POA: Diagnosis not present

## 2021-08-15 DIAGNOSIS — E7800 Pure hypercholesterolemia, unspecified: Secondary | ICD-10-CM | POA: Diagnosis not present

## 2021-08-15 DIAGNOSIS — I5032 Chronic diastolic (congestive) heart failure: Secondary | ICD-10-CM | POA: Diagnosis not present

## 2021-08-15 MED ORDER — DILTIAZEM HCL ER BEADS 180 MG PO CP24: 180.0000 mg | ORAL_CAPSULE | Freq: Every day | ORAL | 0 refills | Status: DC

## 2021-08-15 NOTE — Telephone Encounter (Signed)
*  STAT* If patient is at the pharmacy, call can be transferred to refill team.   1. Which medications need to be refilled? (please list name of each medication and dose if known)   diltiazem (TIAZAC) 180 MG 24 hr capsule   2. Which pharmacy/location (including street and city if local pharmacy) is medication to be sent to? [facility] - [place], [place] - [place] [address]  3. Do they need a 30 day or 90 day supply?  90 day

## 2021-09-09 DIAGNOSIS — H353223 Exudative age-related macular degeneration, left eye, with inactive scar: Secondary | ICD-10-CM | POA: Diagnosis not present

## 2021-09-10 ENCOUNTER — Other Ambulatory Visit: Payer: Self-pay | Admitting: Cardiology

## 2021-09-10 DIAGNOSIS — I4819 Other persistent atrial fibrillation: Secondary | ICD-10-CM

## 2021-09-10 NOTE — Telephone Encounter (Addendum)
Eliquis '5mg'$  refill request received. Patient is 88 years old, weight-84.4kg, Crea-1.32 on 08/08/2020 via [facility] from [facility], Diagnosis-Afib, and last seen by Dr. Ellyn Hack on 11/30/2020. Dose is appropriate based on dosing criteria.   Called PCP office to check on any recent labs and spoke with Vicente Males in medical records. She states he does not have a recent CMP or BMET, but he does have a CBC. Will reach out to the pt to get a updated labs.   Pt was not home and per wife will return in the next hour, she asked if I could call back and advised I would do so.   Called pt and he stated he could make an appt for update labs. Ordered BMET for Friday since CBC done at PCP office. [place] and is aware that a refill was sent to avoid missed doses. Also, confirmed he will come on Friday for the lab.

## 2021-09-11 ENCOUNTER — Ambulatory Visit: Payer: PPO | Admitting: Podiatry

## 2021-09-11 ENCOUNTER — Telehealth: Payer: Self-pay | Admitting: Cardiology

## 2021-09-11 ENCOUNTER — Encounter: Payer: Self-pay | Admitting: Podiatry

## 2021-09-11 DIAGNOSIS — M79675 Pain in left toe(s): Secondary | ICD-10-CM | POA: Diagnosis not present

## 2021-09-11 DIAGNOSIS — M129 Arthropathy, unspecified: Secondary | ICD-10-CM

## 2021-09-11 DIAGNOSIS — B351 Tinea unguium: Secondary | ICD-10-CM | POA: Diagnosis not present

## 2021-09-11 DIAGNOSIS — M79674 Pain in right toe(s): Secondary | ICD-10-CM

## 2021-09-11 DIAGNOSIS — D689 Coagulation defect, unspecified: Secondary | ICD-10-CM

## 2021-09-11 MED ORDER — DILTIAZEM HCL ER BEADS 180 MG PO CP24: 180.0000 mg | ORAL_CAPSULE | Freq: Every day | ORAL | 2 refills | Status: DC

## 2021-09-11 NOTE — Progress Notes (Signed)
This patient returns to my office for at risk foot care.  This patient requires this care by a professional since this patient will be at risk due to having coagulation defect.  Patient is taking eliquiss.  This patient is unable to cut nails himself since the patient cannot reach his nails.These nails are painful walking and wearing shoes.  This patient presents for at risk foot care today.   Patient is taking eliquiss.  General Appearance  Alert, conversant and in no acute stress.  Vascular  Dorsalis pedis and posterior tibial  pulses are palpable  bilaterally.  Capillary return is within normal limits  bilaterally. Temperature is within normal limits  bilaterally.  Neurologic  Senn-Weinstein monofilament wire test within normal limits  bilaterally. Muscle power within normal limits bilaterally.  Nails Thick disfigured discolored nails with subungual debris  from hallux to fifth toes bilaterally. No evidence of bacterial infection or drainage bilaterally.  Orthopedic  No limitations of motion  feet .  No crepitus or effusions noted.  No bony pathology or digital deformities noted.  DJD 1st MPJ  B/L.  Skin  normotropic skin with no porokeratosis noted bilaterally.  No signs of infections or ulcers noted.     Onychomycosis  Pain in right toes  Pain in left toes  Consent was obtained for treatment procedures.   Mechanical debridement of nails 1-5  bilaterally performed with a nail nipper.  Filed with dremel without incident.    Return office visit     4 months                Told patient to return for periodic foot care and evaluation due to potential at risk complications.   [name] DPM  

## 2021-09-11 NOTE — Telephone Encounter (Signed)
*  STAT* If patient is at the pharmacy, call can be transferred to refill team.   1. Which medications need to be refilled? (please list name of each medication and dose if known)  diltiazem (TIAZAC) 180 MG 24 hr capsule  2. Which pharmacy/location (including street and city if local pharmacy) is medication to be sent to? [facility] - [place], [place] - [place] [address]  3. Do they need a 30 day or 90 day supply? 90 day supply

## 2021-09-13 DIAGNOSIS — I4819 Other persistent atrial fibrillation: Secondary | ICD-10-CM | POA: Diagnosis not present

## 2021-09-13 LAB — BASIC METABOLIC PANEL
BUN/Creatinine Ratio: 14 (ref 10–24)
BUN: 18 mg/dL (ref 8–27)
CO2: 24 mmol/L (ref 20–29)
Calcium: 9.9 mg/dL (ref 8.6–10.2)
Chloride: 102 mmol/L (ref 96–106)
Creatinine, Ser: 1.27 mg/dL (ref 0.76–1.27)
Glucose: 76 mg/dL (ref 70–99)
Potassium: 4.9 mmol/L (ref 3.5–5.2)
Sodium: 140 mmol/L (ref 134–144)
eGFR: 54 mL/min/{1.73_m2} — ABNORMAL LOW (ref >59)

## 2021-09-27 DIAGNOSIS — R339 Retention of urine, unspecified: Secondary | ICD-10-CM | POA: Diagnosis not present

## 2021-09-27 DIAGNOSIS — H9201 Otalgia, right ear: Secondary | ICD-10-CM | POA: Diagnosis not present

## 2021-09-27 DIAGNOSIS — E7800 Pure hypercholesterolemia, unspecified: Secondary | ICD-10-CM | POA: Diagnosis not present

## 2021-09-27 DIAGNOSIS — Z79899 Other long term (current) drug therapy: Secondary | ICD-10-CM | POA: Diagnosis not present

## 2021-09-27 DIAGNOSIS — I10 Essential (primary) hypertension: Secondary | ICD-10-CM | POA: Diagnosis not present

## 2021-09-27 DIAGNOSIS — I700 Atherosclerosis of aorta: Secondary | ICD-10-CM | POA: Diagnosis not present

## 2021-09-27 DIAGNOSIS — D6869 Other thrombophilia: Secondary | ICD-10-CM | POA: Diagnosis not present

## 2021-09-27 DIAGNOSIS — I482 Chronic atrial fibrillation, unspecified: Secondary | ICD-10-CM | POA: Diagnosis not present

## 2021-09-30 DIAGNOSIS — I10 Essential (primary) hypertension: Secondary | ICD-10-CM | POA: Diagnosis not present

## 2021-10-21 DIAGNOSIS — H353223 Exudative age-related macular degeneration, left eye, with inactive scar: Secondary | ICD-10-CM | POA: Diagnosis not present

## 2021-10-31 DIAGNOSIS — R339 Retention of urine, unspecified: Secondary | ICD-10-CM | POA: Diagnosis not present

## 2021-11-08 DIAGNOSIS — H353220 Exudative age-related macular degeneration, left eye, stage unspecified: Secondary | ICD-10-CM | POA: Diagnosis not present

## 2021-11-11 DIAGNOSIS — H53412 Scotoma involving central area, left eye: Secondary | ICD-10-CM | POA: Diagnosis not present

## 2021-11-11 DIAGNOSIS — H35363 Drusen (degenerative) of macula, bilateral: Secondary | ICD-10-CM | POA: Diagnosis not present

## 2021-11-11 DIAGNOSIS — H353223 Exudative age-related macular degeneration, left eye, with inactive scar: Secondary | ICD-10-CM | POA: Diagnosis not present

## 2021-11-11 DIAGNOSIS — H353112 Nonexudative age-related macular degeneration, right eye, intermediate dry stage: Secondary | ICD-10-CM | POA: Diagnosis not present

## 2021-11-11 DIAGNOSIS — Z961 Presence of intraocular lens: Secondary | ICD-10-CM | POA: Diagnosis not present

## 2021-11-11 DIAGNOSIS — H35453 Secondary pigmentary degeneration, bilateral: Secondary | ICD-10-CM | POA: Diagnosis not present

## 2021-12-04 DIAGNOSIS — H353223 Exudative age-related macular degeneration, left eye, with inactive scar: Secondary | ICD-10-CM | POA: Diagnosis not present

## 2021-12-11 ENCOUNTER — Other Ambulatory Visit: Payer: Self-pay | Admitting: Cardiology

## 2021-12-11 DIAGNOSIS — I4819 Other persistent atrial fibrillation: Secondary | ICD-10-CM

## 2021-12-11 NOTE — Telephone Encounter (Addendum)
Prescription refill request for Eliquis received. Indication: Afib  Last office visit: 11/30/20 ([facility])  Scr: 1.27 (09/13/21) Age: 88 Weight: 84.4kg  Overdue to see provider. Pt has schedule appt with Dr Ellyn Hack on 12/30/21. Appropriate dose and refill sent to requested pharmacy.

## 2021-12-12 DIAGNOSIS — I5032 Chronic diastolic (congestive) heart failure: Secondary | ICD-10-CM | POA: Diagnosis not present

## 2021-12-12 DIAGNOSIS — I10 Essential (primary) hypertension: Secondary | ICD-10-CM | POA: Diagnosis not present

## 2021-12-12 DIAGNOSIS — I4819 Other persistent atrial fibrillation: Secondary | ICD-10-CM | POA: Diagnosis not present

## 2021-12-12 DIAGNOSIS — E7800 Pure hypercholesterolemia, unspecified: Secondary | ICD-10-CM | POA: Diagnosis not present

## 2021-12-13 DIAGNOSIS — R339 Retention of urine, unspecified: Secondary | ICD-10-CM | POA: Diagnosis not present

## 2021-12-16 DIAGNOSIS — R3912 Poor urinary stream: Secondary | ICD-10-CM | POA: Diagnosis not present

## 2021-12-16 DIAGNOSIS — N401 Enlarged prostate with lower urinary tract symptoms: Secondary | ICD-10-CM | POA: Diagnosis not present

## 2021-12-23 IMAGING — CT CT ANGIO CHEST
2 of 6 series · 13 of 36 positions shown · IV contrast (iopamidol)
Comparison: Chest CT-04/14/2019; abdominal CT-10/10/2019;
06/29/2013

CLINICAL DATA: Left shoulder pain and numbness. Evaluate for
pulmonary embolism.

EXAM:
CT ANGIOGRAPHY CHEST WITH CONTRAST
TECHNIQUE: Multidetector CT imaging of the chest was performed using the
standard protocol during bolus administration of intravenous
contrast. Multiplanar CT image reconstructions and MIPs were
obtained to evaluate the vascular anatomy.
CONTRAST:  75mL MLRHAX-KLF IOPAMIDOL (MLRHAX-KLF) INJECTION 76%

[Series 5: cta thorax 2.00 bv36 s3 axial arterial · axial · arterial · 0.60mm/px · z∈[+1652,+1916]mm · 12 of 158 slices shown]
[im 13/158  lung]
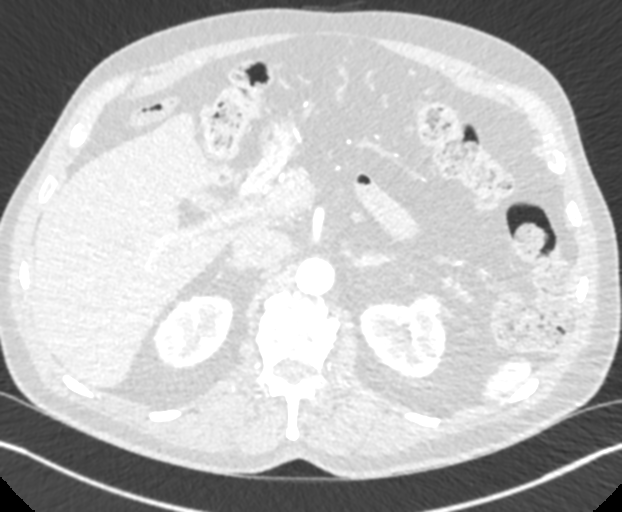
[im 25/158  mediastinal]
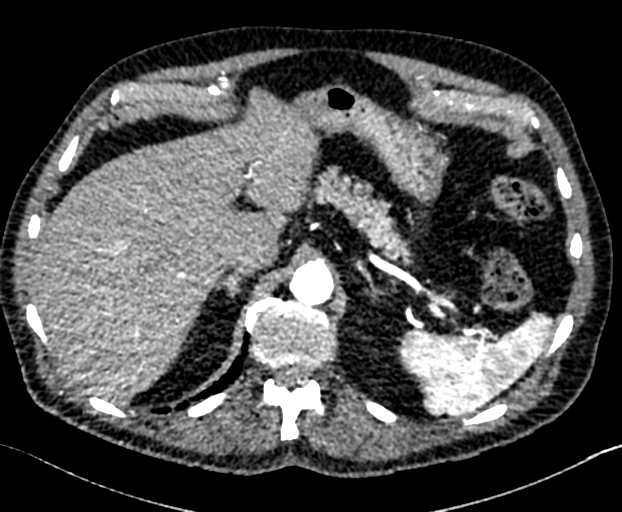
[im 37/158  lung]
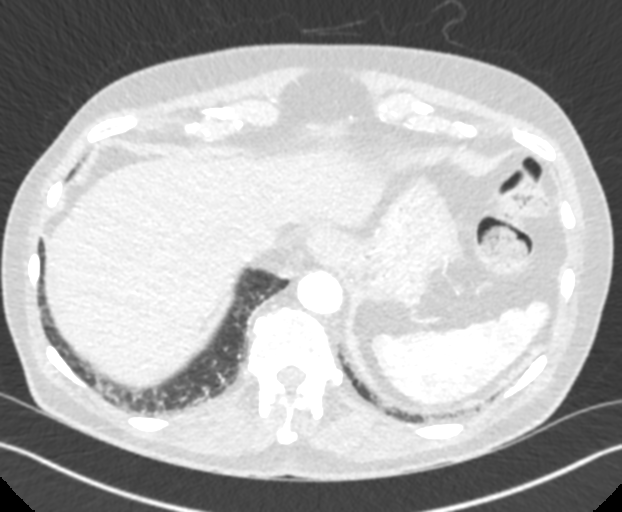
[im 49/158  mediastinal]
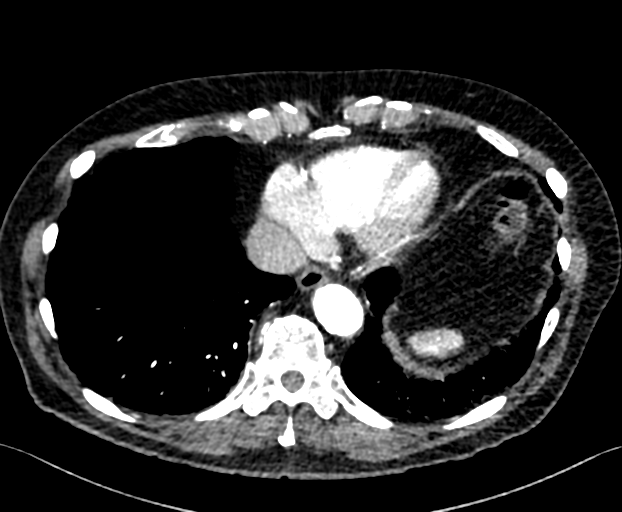
[im 61/158  lung]
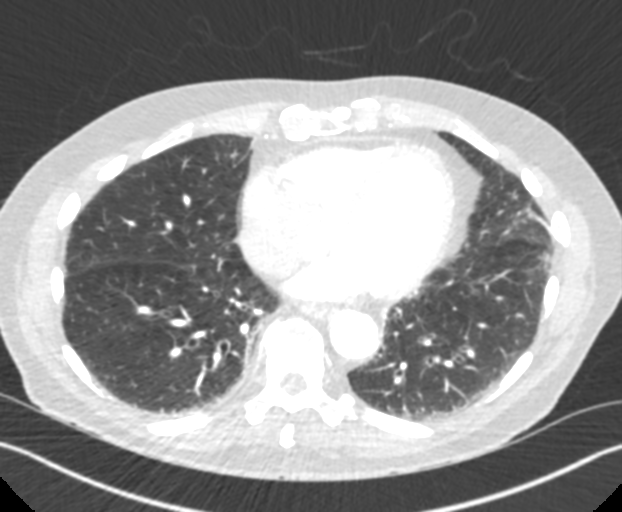
[im 73/158  mediastinal]
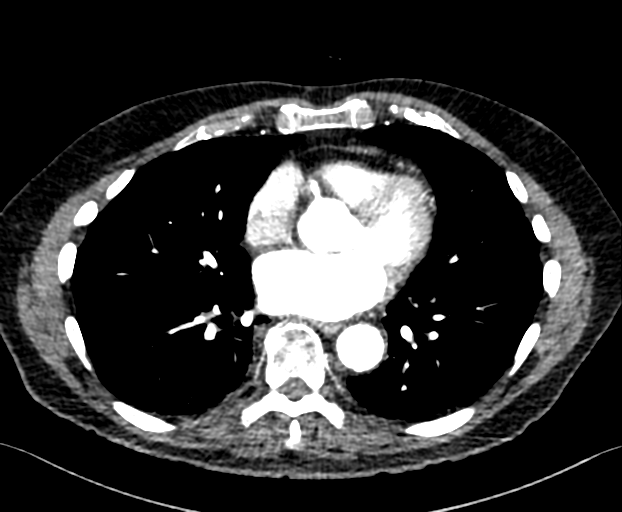
[im 85/158  lung]
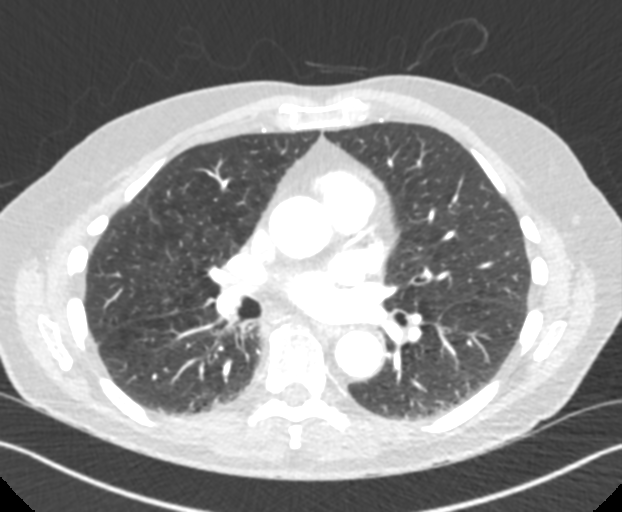
[im 97/158  mediastinal]
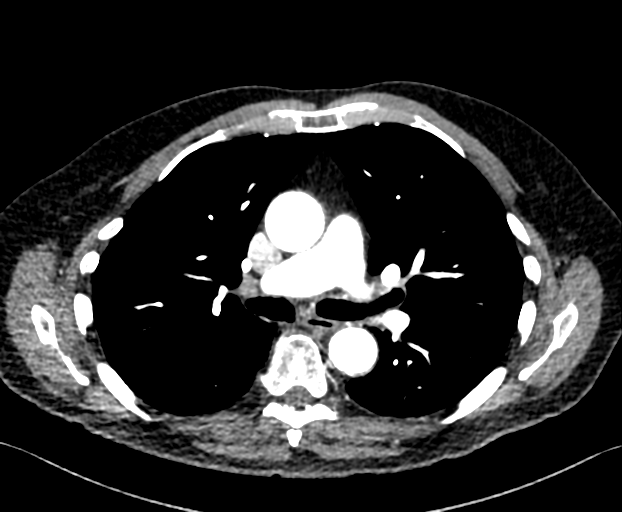
[im 109/158  lung]
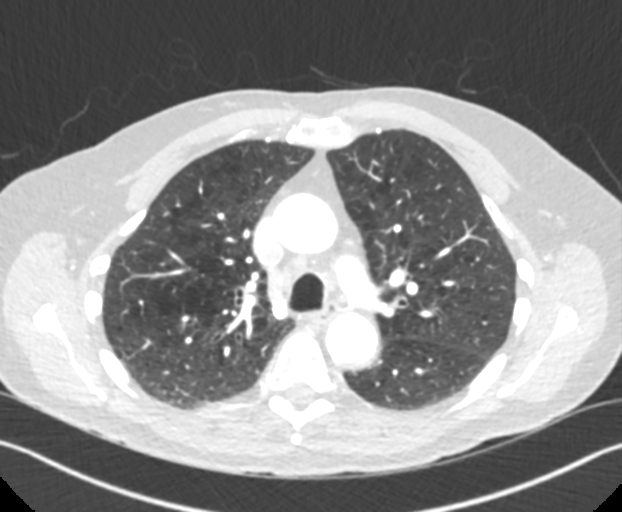
[im 121/158  mediastinal]
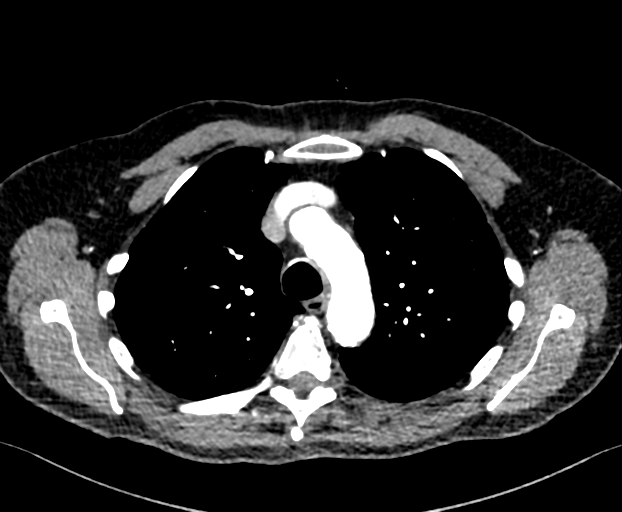
[im 133/158  lung]
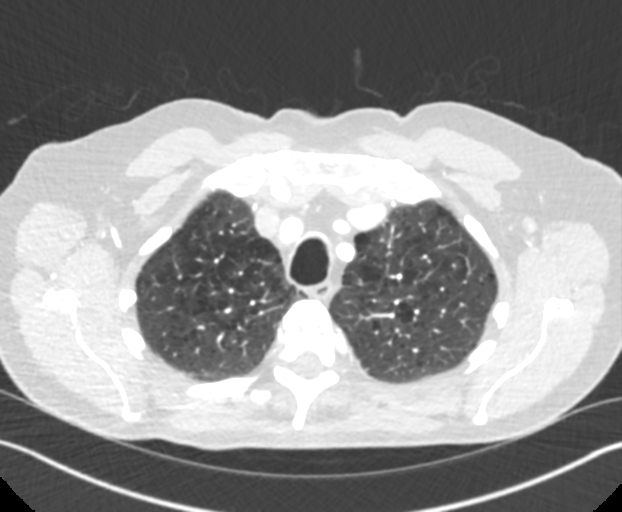
[im 145/158  mediastinal]
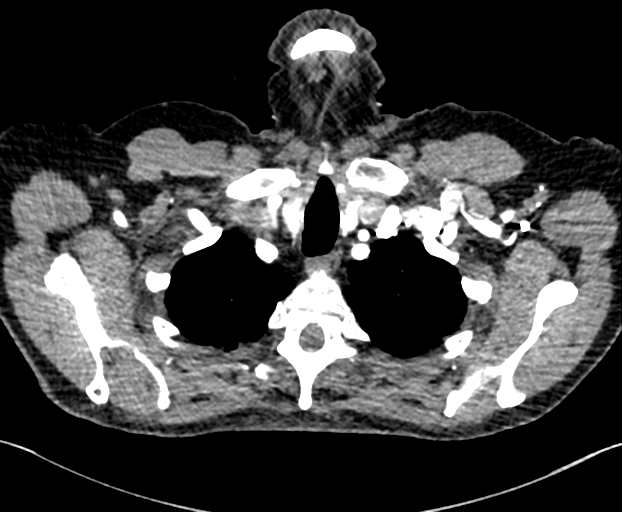

[Series 10: cta thorax 2.00 bv36 s3 cor st · coronal · 0.62mm/px · 1 of 154 slices shown]
[im 77/154  mediastinal]
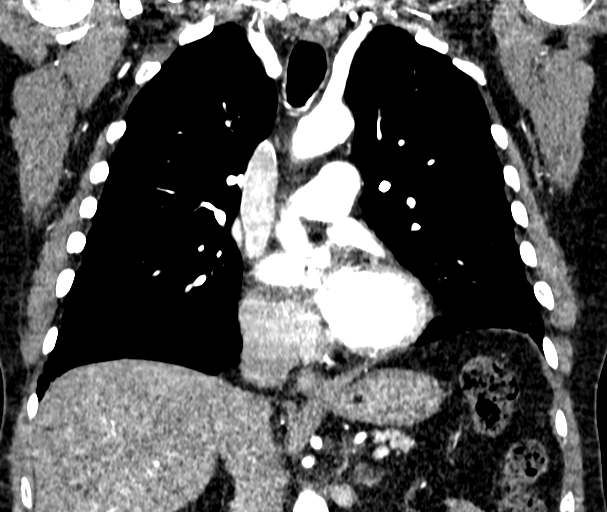

[13 of 36 positions shown; findings below may reference images not displayed]

FINDINGS: Vascular Findings:

There is adequate opacification of the pulmonary arterial system
with the main pulmonary artery measuring 441 Hounsfield units. There
are no discrete filling defects within the pulmonary arterial tree
to suggest pulmonary embolism. Normal caliber of the main pulmonary
artery

Normal heart size. No pericardial effusion. Coronary artery
calcifications. No evidence of thoracic aortic aneurysm or
dissection on this nongated examination.

Moderate amount of mixed calcified and noncalcified atherosclerotic
plaque involving the aortic arch and descending thoracic aorta, not
resulting in a hemodynamically significant stenosis.

Review of the MIP images confirms the above findings.

----------------------------------------------------------------------------------

Nonvascular Findings:

Mediastinum/Lymph Nodes: Scattered mediastinal and hilar lymph nodes
mildly prominent though individually not enlarged by size criteria
with index right suprahilar lymph node measuring 0.9 cm in greatest
short axis diameter (image 62, series 5) and index precarinal lymph
node measuring 0.8 cm (image 55), presumably reactive given advanced
emphysematous change. No bulky mediastinal, hilar or axillary
lymphadenopathy.

Lungs/Pleura: Moderate to severe centrilobular emphysematous change.
Minimal perihilar predominant bronchial wall thickening though the
central pulmonary airways appear widely patent. Minimal dependent
subpleural ground-glass atelectasis. No discrete focal airspace
opacities. No pleural effusion or pneumothorax.

No discrete pulmonary nodules.

Upper abdomen: Limited early arterial phase evaluation of the upper
abdomen demonstrates a tiny splenule at the level of the splenic
hilum.

Musculoskeletal: No acute or aggressive osseous abnormalities.
Stigmata of dish within the thoracic spine. Mild-to-moderate DDD
L1-L2 with disc space height loss, endplate irregularity and
sclerosis.
IMPRESSION: 1. No acute cardiopulmonary disease. Specifically, no evidence of
pulmonary embolism.
2. Moderate to severe centrilobular emphysematous change. Emphysema
(8GX8D-GUH.X).
3. Coronary calcifications.  Aortic Atherosclerosis (8GX8D-L93.3).

## 2021-12-30 ENCOUNTER — Ambulatory Visit: Payer: PPO | Attending: Cardiology | Admitting: Cardiology

## 2021-12-30 ENCOUNTER — Encounter: Payer: Self-pay | Admitting: Cardiology

## 2021-12-30 VITALS — BP 122/58 | HR 64 | Ht 71.0 in | Wt 184.0 lb

## 2021-12-30 DIAGNOSIS — R030 Elevated blood-pressure reading, without diagnosis of hypertension: Secondary | ICD-10-CM

## 2021-12-30 DIAGNOSIS — I2510 Atherosclerotic heart disease of native coronary artery without angina pectoris: Secondary | ICD-10-CM

## 2021-12-30 DIAGNOSIS — I5032 Chronic diastolic (congestive) heart failure: Secondary | ICD-10-CM

## 2021-12-30 DIAGNOSIS — E785 Hyperlipidemia, unspecified: Secondary | ICD-10-CM | POA: Diagnosis not present

## 2021-12-30 DIAGNOSIS — Z7901 Long term (current) use of anticoagulants: Secondary | ICD-10-CM

## 2021-12-30 DIAGNOSIS — I2109 ST elevation (STEMI) myocardial infarction involving other coronary artery of anterior wall: Secondary | ICD-10-CM

## 2021-12-30 DIAGNOSIS — I7143 Infrarenal abdominal aortic aneurysm, without rupture: Secondary | ICD-10-CM | POA: Diagnosis not present

## 2021-12-30 DIAGNOSIS — I519 Heart disease, unspecified: Secondary | ICD-10-CM

## 2021-12-30 DIAGNOSIS — Z9861 Coronary angioplasty status: Secondary | ICD-10-CM

## 2021-12-30 DIAGNOSIS — I4819 Other persistent atrial fibrillation: Secondary | ICD-10-CM | POA: Diagnosis not present

## 2021-12-30 DIAGNOSIS — I25119 Atherosclerotic heart disease of native coronary artery with unspecified angina pectoris: Secondary | ICD-10-CM
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
## 2021-12-30 NOTE — Patient Instructions (Signed)

## 2022-01-03 DIAGNOSIS — H6123 Impacted cerumen, bilateral: Secondary | ICD-10-CM | POA: Diagnosis not present

## 2022-01-03 DIAGNOSIS — H7201 Central perforation of tympanic membrane, right ear: Secondary | ICD-10-CM | POA: Diagnosis not present

## 2022-01-03 DIAGNOSIS — H903 Sensorineural hearing loss, bilateral: Secondary | ICD-10-CM | POA: Diagnosis not present

## 2022-01-04 DIAGNOSIS — Z23 Encounter for immunization: Secondary | ICD-10-CM | POA: Diagnosis not present

## 2022-01-10 DIAGNOSIS — I482 Chronic atrial fibrillation, unspecified: Secondary | ICD-10-CM | POA: Diagnosis not present

## 2022-01-10 DIAGNOSIS — I5032 Chronic diastolic (congestive) heart failure: Secondary | ICD-10-CM | POA: Diagnosis not present

## 2022-01-10 DIAGNOSIS — N400 Enlarged prostate without lower urinary tract symptoms: Secondary | ICD-10-CM | POA: Diagnosis not present

## 2022-01-10 DIAGNOSIS — E7800 Pure hypercholesterolemia, unspecified: Secondary | ICD-10-CM | POA: Diagnosis not present

## 2022-01-10 DIAGNOSIS — I10 Essential (primary) hypertension: Secondary | ICD-10-CM | POA: Diagnosis not present

## 2022-01-15 ENCOUNTER — Encounter: Payer: Self-pay | Admitting: Podiatry

## 2022-01-15 ENCOUNTER — Ambulatory Visit: Payer: PPO | Admitting: Podiatry

## 2022-01-15 DIAGNOSIS — M79676 Pain in unspecified toe(s): Secondary | ICD-10-CM

## 2022-01-15 DIAGNOSIS — D689 Coagulation defect, unspecified: Secondary | ICD-10-CM

## 2022-01-15 DIAGNOSIS — B351 Tinea unguium: Secondary | ICD-10-CM

## 2022-01-15 DIAGNOSIS — M129 Arthropathy, unspecified: Secondary | ICD-10-CM

## 2022-01-15 NOTE — Progress Notes (Signed)
This patient returns to my office for at risk foot care.  This patient requires this care by a professional since this patient will be at risk due to having coagulation defect.  Patient is taking eliquiss.  This patient is unable to cut nails himself since the patient cannot reach his nails.These nails are painful walking and wearing shoes.  This patient presents for at risk foot care today.   Patient is taking eliquiss.  General Appearance  Alert, conversant and in no acute stress.  Vascular  Dorsalis pedis and posterior tibial  pulses are palpable  bilaterally.  Capillary return is within normal limits  bilaterally. Temperature is within normal limits  bilaterally.  Neurologic  Senn-Weinstein monofilament wire test within normal limits  bilaterally. Muscle power within normal limits bilaterally.  Nails Thick disfigured discolored nails with subungual debris  from hallux to fifth toes bilaterally. No evidence of bacterial infection or drainage bilaterally.  Orthopedic  No limitations of motion  feet .  No crepitus or effusions noted.  No bony pathology or digital deformities noted.  DJD 1st MPJ  B/L.  Skin  normotropic skin with no porokeratosis noted bilaterally.  No signs of infections or ulcers noted.     Onychomycosis  Pain in right toes  Pain in left toes  Consent was obtained for treatment procedures.   Mechanical debridement of nails 1-5  bilaterally performed with a nail nipper.  Filed with dremel without incident.    Return office visit     4 months                Told patient to return for periodic foot care and evaluation due to potential at risk complications.   Ahijah Devery DPM  

## 2022-01-25 ENCOUNTER — Encounter: Payer: Self-pay | Admitting: Cardiology

## 2022-01-25 NOTE — Assessment & Plan Note (Signed)
CHA2DS2-VASc of 3 at a minimum.  On DOAC for stroke prevention and rate control with diltiazem.  As far as I can tell, he does not necessarily know if he is or is not in A-fib but but has had bouts of persistent A-fib when he does go into A-fib.  Continue to monitor.  Low threshold to send for cardioversion plus or minus with chemical assistance.

## 2022-01-25 NOTE — Assessment & Plan Note (Signed)
Not really hypertensive.  He is only on low-dose diltiazem.  Stable.  Would avoid overtreatment.

## 2022-01-25 NOTE — Assessment & Plan Note (Signed)
Following up with vascular surgery as recommended.  Provided to stay stable, no plans for procedures given his advanced age.  Continue blood pressure and lipid management.

## 2022-01-25 NOTE — Assessment & Plan Note (Signed)
On stable regimen as mentioned.  No recurrent angina.  Continue diltiazem, statin and DOAC.

## 2022-01-25 NOTE — Assessment & Plan Note (Signed)
Right maintained sinus rhythm on diltiazem.  Has not had breakthrough spells that he can tell.  I do not think his dizzy spells are related to A-fib consider are not associated irregular heartbeats or palpitations.  Plan: Continue current dose of diltiazem and DOAC (Eliquis).

## 2022-01-25 NOTE — Assessment & Plan Note (Signed)
9 years out from his LAD PCI.  This is in the setting of an MI and he has not had any further anginal symptoms.  Nor is he had any heart failure symptoms. No longer on antiplatelet agent because of Eliquis due to A-fib. Intolerant of beta-blocker because of fatigue, tolerating calcium channel-blocker (diltiazem instead)-this is acceptable with preserved EF. Chose diltiazem over amlodipine with good control as well. Lipids been well controlled on current dose of statin. BP well controlled with the diltiazem along with long-acting medicine.

## 2022-01-25 NOTE — Assessment & Plan Note (Signed)
Lipids well controlled as of July.  Continue current dose of atorvastatin 20 mg daily tolerating well.Marland Kitchen

## 2022-01-25 NOTE — Assessment & Plan Note (Signed)
EF improved to 50% on follow-up echocardiogram.  NYHA class I symptoms with mild edema.  No real active symptoms may be class I.  Not requiring diuretic as he is euvolemic. No PND, orthopnea with trivial edema that is managed with an foot elevation.  Try to avoid diuretic. Intolerant of beta-blocker, okay to use nondihydropyridine calcium channel blocker for antianginal rate control agent with preserved EF.

## 2022-01-25 NOTE — Assessment & Plan Note (Signed)
9 years out from anterior STEMI with LAD PCI.  EF after revascularization improved back into the normal range.  On minimal medical management, try to avoid polypharmacy in elderly gentleman.

## 2022-01-27 DIAGNOSIS — H353223 Exudative age-related macular degeneration, left eye, with inactive scar: Secondary | ICD-10-CM | POA: Diagnosis not present

## 2022-01-27 DIAGNOSIS — R339 Retention of urine, unspecified: Secondary | ICD-10-CM | POA: Diagnosis not present

## 2022-01-28 ENCOUNTER — Ambulatory Visit: Payer: PPO

## 2022-01-28 ENCOUNTER — Other Ambulatory Visit (HOSPITAL_COMMUNITY): Payer: PPO

## 2022-02-07 DIAGNOSIS — I10 Essential (primary) hypertension: Secondary | ICD-10-CM | POA: Diagnosis not present

## 2022-02-07 DIAGNOSIS — Z515 Encounter for palliative care: Secondary | ICD-10-CM | POA: Diagnosis not present

## 2022-02-07 DIAGNOSIS — E7800 Pure hypercholesterolemia, unspecified: Secondary | ICD-10-CM | POA: Diagnosis not present

## 2022-02-07 DIAGNOSIS — I5032 Chronic diastolic (congestive) heart failure: Secondary | ICD-10-CM | POA: Diagnosis not present

## 2022-02-07 DIAGNOSIS — I4819 Other persistent atrial fibrillation: Secondary | ICD-10-CM | POA: Diagnosis not present

## 2022-02-17 DIAGNOSIS — I10 Essential (primary) hypertension: Secondary | ICD-10-CM | POA: Diagnosis not present

## 2022-02-17 DIAGNOSIS — Z0000 Encounter for general adult medical examination without abnormal findings: Secondary | ICD-10-CM | POA: Diagnosis not present

## 2022-02-17 DIAGNOSIS — I480 Paroxysmal atrial fibrillation: Secondary | ICD-10-CM | POA: Diagnosis not present

## 2022-02-17 DIAGNOSIS — I700 Atherosclerosis of aorta: Secondary | ICD-10-CM | POA: Diagnosis not present

## 2022-02-17 DIAGNOSIS — Z1389 Encounter for screening for other disorder: Secondary | ICD-10-CM | POA: Diagnosis not present
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
## 2022-02-18 ENCOUNTER — Ambulatory Visit (HOSPITAL_COMMUNITY)
Admission: RE | Admit: 2022-02-18 | Discharge: 2022-02-18 | Disposition: A | Discharge status: Home / Self Care | Payer: PPO | Source: Ambulatory Visit | Attending: Physician Assistant | Admitting: Physician Assistant

## 2022-02-18 ENCOUNTER — Ambulatory Visit: Payer: PPO | Admitting: Physician Assistant

## 2022-02-18 VITALS — BP 96/42 | HR 59 | Temp 98.0°F | Resp 18 | Ht 71.0 in | Wt 181.7 lb

## 2022-02-18 DIAGNOSIS — I714 Abdominal aortic aneurysm, without rupture, unspecified: Secondary | ICD-10-CM | POA: Insufficient documentation

## 2022-02-21 ENCOUNTER — Telehealth: Payer: Self-pay | Admitting: Cardiology

## 2022-02-21 MED ORDER — DILTIAZEM HCL ER BEADS 180 MG PO CP24: 180.0000 mg | ORAL_CAPSULE | Freq: Every day | ORAL | 6 refills | Status: DC

## 2022-02-21 NOTE — Telephone Encounter (Signed)
 *  STAT* If patient is at the pharmacy, call can be transferred to refill team.   1. Which medications need to be refilled? (please list name of each medication and dose if known) diltiazem (TIAZAC) 180 MG 24 hr capsule   2. Which pharmacy/location (including street and city if local pharmacy) is medication to be sent to? [facility] - [place], [place] - [place] [address]   3. Do they need a 30 day or 90 day supply? 90 days  Pt was seen on 12/30/21 with Dr. Ellyn Hack

## 2022-02-26 ENCOUNTER — Other Ambulatory Visit: Payer: Self-pay

## 2022-02-26 DIAGNOSIS — I714 Abdominal aortic aneurysm, without rupture, unspecified: Secondary | ICD-10-CM

## 2022-03-07 DIAGNOSIS — R339 Retention of urine, unspecified: Secondary | ICD-10-CM | POA: Diagnosis not present

## 2022-03-10 ENCOUNTER — Other Ambulatory Visit: Payer: Self-pay | Admitting: Cardiology

## 2022-03-10 DIAGNOSIS — I4819 Other persistent atrial fibrillation: Secondary | ICD-10-CM

## 2022-03-10 DIAGNOSIS — H353223 Exudative age-related macular degeneration, left eye, with inactive scar: Secondary | ICD-10-CM | POA: Diagnosis not present

## 2022-03-13 DIAGNOSIS — I5032 Chronic diastolic (congestive) heart failure: Secondary | ICD-10-CM | POA: Diagnosis not present

## 2022-03-13 DIAGNOSIS — E7800 Pure hypercholesterolemia, unspecified: Secondary | ICD-10-CM | POA: Diagnosis not present

## 2022-03-13 DIAGNOSIS — I10 Essential (primary) hypertension: Secondary | ICD-10-CM | POA: Diagnosis not present

## 2022-03-13 DIAGNOSIS — I480 Paroxysmal atrial fibrillation: Secondary | ICD-10-CM | POA: Diagnosis not present

## 2022-03-28 DIAGNOSIS — R062 Wheezing: Secondary | ICD-10-CM | POA: Diagnosis not present

## 2022-03-28 DIAGNOSIS — R051 Acute cough: Secondary | ICD-10-CM | POA: Diagnosis not present

## 2022-03-28 DIAGNOSIS — U071 COVID-19: Secondary | ICD-10-CM | POA: Diagnosis not present

## 2022-03-28 DIAGNOSIS — R6883 Chills (without fever): Secondary | ICD-10-CM | POA: Diagnosis not present

## 2022-03-28 DIAGNOSIS — R5381 Other malaise: Secondary | ICD-10-CM | POA: Diagnosis not present

## 2022-04-07 DIAGNOSIS — B370 Candidal stomatitis: Secondary | ICD-10-CM | POA: Diagnosis not present

## 2022-04-07 DIAGNOSIS — J189 Pneumonia, unspecified organism: Secondary | ICD-10-CM | POA: Diagnosis not present

## 2022-04-07 DIAGNOSIS — J019 Acute sinusitis, unspecified: Secondary | ICD-10-CM | POA: Diagnosis not present

## 2022-04-21 DIAGNOSIS — J189 Pneumonia, unspecified organism: Secondary | ICD-10-CM | POA: Diagnosis not present

## 2022-04-28 DIAGNOSIS — H353112 Nonexudative age-related macular degeneration, right eye, intermediate dry stage: Secondary | ICD-10-CM | POA: Diagnosis not present

## 2022-04-28 DIAGNOSIS — R339 Retention of urine, unspecified: Secondary | ICD-10-CM | POA: Diagnosis not present

## 2022-04-28 DIAGNOSIS — H35453 Secondary pigmentary degeneration, bilateral: Secondary | ICD-10-CM | POA: Diagnosis not present

## 2022-04-28 DIAGNOSIS — Z961 Presence of intraocular lens: Secondary | ICD-10-CM | POA: Diagnosis not present

## 2022-04-28 DIAGNOSIS — H35363 Drusen (degenerative) of macula, bilateral: Secondary | ICD-10-CM | POA: Diagnosis not present

## 2022-04-28 DIAGNOSIS — H353223 Exudative age-related macular degeneration, left eye, with inactive scar: Secondary | ICD-10-CM | POA: Diagnosis not present

## 2022-04-28 DIAGNOSIS — H53412 Scotoma involving central area, left eye: Secondary | ICD-10-CM | POA: Diagnosis not present

## 2022-04-30 DIAGNOSIS — H353223 Exudative age-related macular degeneration, left eye, with inactive scar: Secondary | ICD-10-CM | POA: Diagnosis not present

## 2022-05-21 ENCOUNTER — Encounter: Payer: Self-pay | Admitting: Podiatry

## 2022-05-21 ENCOUNTER — Ambulatory Visit: Payer: PPO | Admitting: Podiatry

## 2022-05-21 DIAGNOSIS — M79676 Pain in unspecified toe(s): Secondary | ICD-10-CM

## 2022-05-21 DIAGNOSIS — M129 Arthropathy, unspecified: Secondary | ICD-10-CM

## 2022-05-21 DIAGNOSIS — D689 Coagulation defect, unspecified: Secondary | ICD-10-CM

## 2022-05-21 DIAGNOSIS — B351 Tinea unguium: Secondary | ICD-10-CM | POA: Diagnosis not present

## 2022-05-21 NOTE — Progress Notes (Signed)
This patient returns to my office for at risk foot care.  This patient requires this care by a professional since this patient will be at risk due to having coagulation defect.  Patient is taking eliquiss.  This patient is unable to cut nails himself since the patient cannot reach his nails.These nails are painful walking and wearing shoes.  This patient presents for at risk foot care today.   Patient is taking eliquiss.  General Appearance  Alert, conversant and in no acute stress.  Vascular  Dorsalis pedis and posterior tibial  pulses are palpable  bilaterally.  Capillary return is within normal limits  bilaterally. Temperature is within normal limits  bilaterally.  Neurologic  Senn-Weinstein monofilament wire test within normal limits  bilaterally. Muscle power within normal limits bilaterally.  Nails Thick disfigured discolored nails with subungual debris  from hallux to fifth toes bilaterally. No evidence of bacterial infection or drainage bilaterally.  Orthopedic  No limitations of motion  feet .  No crepitus or effusions noted.  No bony pathology or digital deformities noted.  DJD 1st MPJ  B/L.  Skin  normotropic skin with no porokeratosis noted bilaterally.  No signs of infections or ulcers noted.     Onychomycosis  Pain in right toes  Pain in left toes  Consent was obtained for treatment procedures.   Mechanical debridement of nails 1-5  bilaterally performed with a nail nipper.  Filed with dremel without incident.    Return office visit     4 months                Told patient to return for periodic foot care and evaluation due to potential at risk complications.   Gardiner Barefoot DPM

## 2022-06-11 DIAGNOSIS — H353223 Exudative age-related macular degeneration, left eye, with inactive scar: Secondary | ICD-10-CM | POA: Diagnosis not present

## 2022-06-20 DIAGNOSIS — R339 Retention of urine, unspecified: Secondary | ICD-10-CM | POA: Diagnosis not present

## 2022-06-23 DIAGNOSIS — D485 Neoplasm of uncertain behavior of skin: Secondary | ICD-10-CM | POA: Diagnosis not present

## 2022-06-23 DIAGNOSIS — C44319 Basal cell carcinoma of skin of other parts of face: Secondary | ICD-10-CM | POA: Diagnosis not present

## 2022-06-23 DIAGNOSIS — L821 Other seborrheic keratosis: Secondary | ICD-10-CM | POA: Diagnosis not present

## 2022-06-23 DIAGNOSIS — Z85828 Personal history of other malignant neoplasm of skin: Secondary | ICD-10-CM | POA: Diagnosis not present

## 2022-07-30 DIAGNOSIS — H353223 Exudative age-related macular degeneration, left eye, with inactive scar: Secondary | ICD-10-CM | POA: Diagnosis not present

## 2022-08-06 DIAGNOSIS — T63481A Toxic effect of venom of other arthropod, accidental (unintentional), initial encounter: Secondary | ICD-10-CM | POA: Diagnosis not present

## 2022-08-09 DIAGNOSIS — L03319 Cellulitis of trunk, unspecified: Secondary | ICD-10-CM | POA: Diagnosis not present

## 2022-08-09 DIAGNOSIS — T63481A Toxic effect of venom of other arthropod, accidental (unintentional), initial encounter: Secondary | ICD-10-CM | POA: Diagnosis not present

## 2022-08-11 DIAGNOSIS — R339 Retention of urine, unspecified: Secondary | ICD-10-CM | POA: Diagnosis not present

## 2022-08-20 DIAGNOSIS — N1831 Chronic kidney disease, stage 3a: Secondary | ICD-10-CM | POA: Diagnosis not present

## 2022-08-20 DIAGNOSIS — E7800 Pure hypercholesterolemia, unspecified: Secondary | ICD-10-CM | POA: Diagnosis not present

## 2022-08-20 DIAGNOSIS — J439 Emphysema, unspecified: Secondary | ICD-10-CM | POA: Diagnosis not present

## 2022-08-20 DIAGNOSIS — H353221 Exudative age-related macular degeneration, left eye, with active choroidal neovascularization: Secondary | ICD-10-CM | POA: Diagnosis not present

## 2022-08-20 DIAGNOSIS — I110 Hypertensive heart disease with heart failure: Secondary | ICD-10-CM | POA: Diagnosis not present

## 2022-08-20 DIAGNOSIS — I482 Chronic atrial fibrillation, unspecified: Secondary | ICD-10-CM | POA: Diagnosis not present

## 2022-08-20 DIAGNOSIS — S30861A Insect bite (nonvenomous) of abdominal wall, initial encounter: Secondary | ICD-10-CM | POA: Diagnosis not present

## 2022-08-20 DIAGNOSIS — I700 Atherosclerosis of aorta: Secondary | ICD-10-CM | POA: Diagnosis not present

## 2022-08-20 DIAGNOSIS — D6869 Other thrombophilia: Secondary | ICD-10-CM | POA: Diagnosis not present

## 2022-08-20 DIAGNOSIS — N400 Enlarged prostate without lower urinary tract symptoms: Secondary | ICD-10-CM | POA: Diagnosis not present

## 2022-08-20 DIAGNOSIS — I5032 Chronic diastolic (congestive) heart failure: Secondary | ICD-10-CM | POA: Diagnosis not present

## 2022-09-02 ENCOUNTER — Ambulatory Visit (INDEPENDENT_AMBULATORY_CARE_PROVIDER_SITE_OTHER): Payer: PPO | Admitting: Physician Assistant

## 2022-09-02 ENCOUNTER — Ambulatory Visit (HOSPITAL_COMMUNITY)
Admission: RE | Admit: 2022-09-02 | Discharge: 2022-09-02 | Disposition: A | Discharge status: Home / Self Care | Payer: PPO | Source: Ambulatory Visit | Attending: Vascular Surgery | Admitting: Vascular Surgery

## 2022-09-02 VITALS — BP 117/69 | HR 64 | Temp 97.8°F | Resp 18 | Ht 71.0 in | Wt 176.9 lb

## 2022-09-02 DIAGNOSIS — I714 Abdominal aortic aneurysm, without rupture, unspecified: Secondary | ICD-10-CM

## 2022-09-02 NOTE — Progress Notes (Signed)
Office Note   History of Present Illness   [name] is a 89 y.o. (September 28, 1931) male who presents for follow up of AAA.  He was originally followed by Dr. Arbie Cookey.  He was last seen by our office 6 months ago, with maximum diameter of the aneurysm measuring 5.1 cm by [place].   He returns today for follow-up.  He denies any stomach, back, or chest pain.  He also denies any claudication, rest pain, nonhealing wounds of the lower extremities.   He spends a lot of time outside since he is retired. He stays fairly active. Current Outpatient Medications  Medication Sig Dispense Refill   alfuzosin (UROXATRAL) 10 MG 24 hr tablet Take 10 mg by mouth daily.  10   atorvastatin (LIPITOR) 20 MG tablet 1 tablet     diclofenac Sodium (VOLTAREN) 1 % GEL Apply topically 4 (four) times daily.     diltiazem (TIAZAC) 180 MG 24 hr capsule Take 1 capsule (180 mg total) by mouth daily. Schedule an appointment for further refills 60 capsule 6   dutasteride (AVODART) 0.5 MG capsule Take 0.5 mg by mouth daily.     ELIQUIS 5 MG TABS tablet TAKE ONE TABLET BY MOUTH AT BREAKFAST AND AT BEDTIME 180 tablet 3   fluticasone (FLONASE) 50 MCG/ACT nasal spray Place 2 sprays into both nostrils daily as needed for allergies.  5   Glucosamine HCl (GLUCOSAMINE PO) Take 1,500 mg by mouth 3 (three) times daily.     levocetirizine (XYZAL) 5 MG tablet Take 5 mg by mouth every evening.   5   montelukast (SINGULAIR) 10 MG tablet Take 10 mg by mouth daily.     Multiple Vitamins-Minerals (ICAPS AREDS 2 PO) Take 1 tablet by mouth daily.      nitroGLYCERIN (NITROSTAT) 0.4 MG SL tablet Place 0.4 mg under the tongue every 5 (five) minutes as needed for chest pain.     No current facility-administered medications for this visit.    REVIEW OF SYSTEMS (negative unless checked):   Cardiac:  []  Chest pain or chest pressure? []  Shortness of breath upon activity? []  Shortness of breath when lying flat? []  Irregular heart  rhythm?  Vascular:  []  Pain in calf, thigh, or hip brought on by walking? []  Pain in feet at night that wakes you up from your sleep? []  Blood clot in your veins? []  Leg swelling?  Pulmonary:  []  Oxygen at home? []  Productive cough? []  Wheezing?  Neurologic:  []  Sudden weakness in arms or legs? []  Sudden numbness in arms or legs? []  Sudden onset of difficult speaking or slurred speech? []  Temporary loss of vision in one Hopkins? []  Problems with dizziness?  Gastrointestinal:  []  Blood in stool? []  Vomited blood?  Genitourinary:  []  Burning when urinating? []  Blood in urine?  Psychiatric:  []  Major depression  Hematologic:  []  Bleeding problems? []  Problems with blood clotting?  Dermatologic:  []  Rashes or ulcers?  Constitutional:  []  Fever or chills?  Ear/Nose/Throat:  []  Change in hearing? []  Nose bleeds? []  Sore throat?  Musculoskeletal:  []  Back pain? []  Joint pain? []  Muscle pain?   Physical Examination   Vitals:   09/02/22 0849  BP: 117/69  Pulse: 64  Resp: 18  Temp: 97.8 F (36.6 C)  TempSrc: Temporal  SpO2: 95%  Weight: 176 lb 14.4 oz (80.2 kg)  Height: 5\' 11"  (1.803 m)   Body mass index is 24.67 kg/m.  General:  WDWN in NAD;  vital signs documented above Gait: Not observed HENT: WNL, normocephalic Pulmonary: normal non-labored breathing , without rales, rhonchi,  wheezing Cardiac: regular HR, without murmurs without carotid bruit Abdomen: soft, NT, no masses Skin: without rashes Vascular Exam/Pulses: 2+ radial and DP pulses bilaterally Extremities: without ischemic changes, without gangrene , without cellulitis; without open wounds;  Musculoskeletal: no muscle wasting or atrophy  Neurologic: A&O X 3;  No focal weakness or paresthesias are detected Psychiatric:  The pt has Normal affect.   Non-Invasive Vascular Imaging   AAA Duplex (09/02/2022) Current size: 4.99 cm Previous size: 5.18 cm (02/18/2022) R CIA: 1.3 cm L CIA: 1.3  cm   Medical Decision Making   [name] is a 89 y.o. ([birth year]) male who presents for surveillance of AAA  Based on this patient's duplex, the maximum diameter of his AAA today is 4.99 cm.  His previous maximum measurement in November 2023 was 5.18 cm He denies any chest, back, or stomach pain The threshold for repair is AAA size > 5.5 cm, growth > 1 cm/yr, and symptomatic status. He can follow up with our office in 6 months with repeat AAA duplex   Loel Dubonnet PA-C [facility] [place] Office: [phone]  Clinic MD: Steve Rattler

## 2022-09-15 ENCOUNTER — Other Ambulatory Visit: Payer: Self-pay

## 2022-09-15 DIAGNOSIS — I714 Abdominal aortic aneurysm, without rupture, unspecified: Secondary | ICD-10-CM

## 2022-09-19 ENCOUNTER — Ambulatory Visit: Payer: PPO | Admitting: Podiatry

## 2022-09-19 ENCOUNTER — Encounter: Payer: Self-pay | Admitting: Podiatry

## 2022-09-19 DIAGNOSIS — B351 Tinea unguium: Secondary | ICD-10-CM | POA: Diagnosis not present

## 2022-09-19 DIAGNOSIS — D689 Coagulation defect, unspecified: Secondary | ICD-10-CM

## 2022-09-19 DIAGNOSIS — M79676 Pain in unspecified toe(s): Secondary | ICD-10-CM

## 2022-09-19 NOTE — Progress Notes (Signed)
This patient returns to my office for at risk foot care.  This patient requires this care by a professional since this patient will be at risk due to having coagulation defect.  Patient is taking eliquiss.  This patient is unable to cut nails himself since the patient cannot reach his nails.These nails are painful walking and wearing shoes.  This patient presents for at risk foot care today.   Patient is taking eliquiss.  General Appearance  Alert, conversant and in no acute stress.  Vascular  Dorsalis pedis and posterior tibial  pulses are palpable  bilaterally.  Capillary return is within normal limits  bilaterally. Temperature is within normal limits  bilaterally.  Neurologic  Senn-Weinstein monofilament wire test within normal limits  bilaterally. Muscle power within normal limits bilaterally.  Nails Thick disfigured discolored nails with subungual debris  from hallux to fifth toes bilaterally. No evidence of bacterial infection or drainage bilaterally.  Orthopedic  No limitations of motion  feet .  No crepitus or effusions noted.  No bony pathology or digital deformities noted.  DJD 1st MPJ  B/L.  Skin  normotropic skin with no porokeratosis noted bilaterally.  No signs of infections or ulcers noted.     Onychomycosis  Pain in right toes  Pain in left toes  Consent was obtained for treatment procedures.   Mechanical debridement of nails 1-5  bilaterally performed with a nail nipper.  Filed with dremel without incident.    Return office visit     4 months                Told patient to return for periodic foot care and evaluation due to potential at risk complications.   Ife Vitelli DPM  

## 2022-09-22 DIAGNOSIS — H353221 Exudative age-related macular degeneration, left eye, with active choroidal neovascularization: Secondary | ICD-10-CM | POA: Diagnosis not present

## 2022-09-26 DIAGNOSIS — R339 Retention of urine, unspecified: Secondary | ICD-10-CM | POA: Diagnosis not present

## 2022-10-11 DIAGNOSIS — S82152A Displaced fracture of left tibial tuberosity, initial encounter for closed fracture: Secondary | ICD-10-CM | POA: Diagnosis not present

## 2022-10-11 DIAGNOSIS — R600 Localized edema: Secondary | ICD-10-CM | POA: Diagnosis not present

## 2022-10-11 DIAGNOSIS — S80812A Abrasion, left lower leg, initial encounter: Secondary | ICD-10-CM | POA: Diagnosis not present

## 2022-10-11 DIAGNOSIS — L03116 Cellulitis of left lower limb: Secondary | ICD-10-CM | POA: Diagnosis not present

## 2022-10-11 DIAGNOSIS — M79605 Pain in left leg: Secondary | ICD-10-CM | POA: Diagnosis not present

## 2022-10-16 ENCOUNTER — Other Ambulatory Visit (HOSPITAL_BASED_OUTPATIENT_CLINIC_OR_DEPARTMENT_OTHER): Payer: Self-pay | Admitting: Internal Medicine

## 2022-10-16 DIAGNOSIS — S82142D Displaced bicondylar fracture of left tibia, subsequent encounter for closed fracture with routine healing: Secondary | ICD-10-CM | POA: Diagnosis not present

## 2022-10-16 DIAGNOSIS — M7989 Other specified soft tissue disorders: Secondary | ICD-10-CM | POA: Diagnosis not present

## 2022-10-16 DIAGNOSIS — T148XXA Other injury of unspecified body region, initial encounter: Secondary | ICD-10-CM | POA: Diagnosis not present

## 2022-10-17 ENCOUNTER — Ambulatory Visit (HOSPITAL_BASED_OUTPATIENT_CLINIC_OR_DEPARTMENT_OTHER)
Admission: RE | Admit: 2022-10-17 | Discharge: 2022-10-17 | Disposition: A | Discharge status: Home / Self Care | Payer: PPO | Source: Ambulatory Visit | Attending: Internal Medicine | Admitting: Internal Medicine

## 2022-10-17 DIAGNOSIS — R600 Localized edema: Secondary | ICD-10-CM | POA: Diagnosis not present

## 2022-10-17 DIAGNOSIS — S8992XA Unspecified injury of left lower leg, initial encounter: Secondary | ICD-10-CM | POA: Diagnosis not present

## 2022-10-17 DIAGNOSIS — L0390 Cellulitis, unspecified: Secondary | ICD-10-CM | POA: Diagnosis not present

## 2022-10-17 DIAGNOSIS — M7989 Other specified soft tissue disorders: Secondary | ICD-10-CM | POA: Diagnosis not present

## 2022-10-17 DIAGNOSIS — L539 Erythematous condition, unspecified: Secondary | ICD-10-CM | POA: Diagnosis not present

## 2022-10-18 DIAGNOSIS — M79662 Pain in left lower leg: Secondary | ICD-10-CM | POA: Diagnosis not present

## 2022-10-23 DIAGNOSIS — M1712 Unilateral primary osteoarthritis, left knee: Secondary | ICD-10-CM | POA: Diagnosis not present

## 2022-10-27 DIAGNOSIS — H353221 Exudative age-related macular degeneration, left eye, with active choroidal neovascularization: Secondary | ICD-10-CM | POA: Diagnosis not present

## 2022-10-27 DIAGNOSIS — Z961 Presence of intraocular lens: Secondary | ICD-10-CM | POA: Diagnosis not present

## 2022-10-27 DIAGNOSIS — H353112 Nonexudative age-related macular degeneration, right eye, intermediate dry stage: Secondary | ICD-10-CM | POA: Diagnosis not present

## 2022-10-27 DIAGNOSIS — H35453 Secondary pigmentary degeneration, bilateral: Secondary | ICD-10-CM | POA: Diagnosis not present

## 2022-10-27 DIAGNOSIS — H35363 Drusen (degenerative) of macula, bilateral: Secondary | ICD-10-CM | POA: Diagnosis not present

## 2022-10-30 DIAGNOSIS — M79662 Pain in left lower leg: Secondary | ICD-10-CM | POA: Diagnosis not present

## 2022-11-10 DIAGNOSIS — H353221 Exudative age-related macular degeneration, left eye, with active choroidal neovascularization: Secondary | ICD-10-CM | POA: Diagnosis not present

## 2022-11-26 DIAGNOSIS — R339 Retention of urine, unspecified: Secondary | ICD-10-CM | POA: Diagnosis not present

## 2022-12-08 ENCOUNTER — Other Ambulatory Visit: Payer: Self-pay | Admitting: Cardiology

## 2022-12-08 DIAGNOSIS — I4819 Other persistent atrial fibrillation: Secondary | ICD-10-CM

## 2022-12-08 MED ORDER — DILTIAZEM HCL ER BEADS 180 MG PO CP24: 180.0000 mg | ORAL_CAPSULE | Freq: Every day | ORAL | 1 refills | Status: DC

## 2022-12-08 MED ORDER — APIXABAN 5 MG PO TABS: 5.0000 mg | ORAL_TABLET | Freq: Two times a day (BID) | ORAL | 0 refills | Status: DC

## 2022-12-08 NOTE — Telephone Encounter (Signed)
Pt c/o medication issue:  1. Name of Medication: ELIQUIS 5 MG TABS tablet  diltiazem (TIAZAC) 180 MG 24 hr capsule   2. How are you currently taking this medication (dosage and times per day)? As prescribed   3. Are you having a reaction (difficulty breathing--STAT)?   4. What is your medication issue? Patient's pharmacy closed with no notice and patient needs medication now sent to:     [id number] - [place], [place] - [address]

## 2022-12-08 NOTE — Telephone Encounter (Signed)
Prescription refill request for Eliquis received. Indication:afib Last office visit:10/23 OZD:GUYQI labs Age: 89 Weight:80.2  kg  Prescription refilled

## 2022-12-31 DIAGNOSIS — R3912 Poor urinary stream: Secondary | ICD-10-CM | POA: Diagnosis not present

## 2022-12-31 DIAGNOSIS — N401 Enlarged prostate with lower urinary tract symptoms: Secondary | ICD-10-CM | POA: Diagnosis not present

## 2023-01-05 DIAGNOSIS — H353221 Exudative age-related macular degeneration, left eye, with active choroidal neovascularization: Secondary | ICD-10-CM | POA: Diagnosis not present

## 2023-01-09 ENCOUNTER — Encounter: Payer: Self-pay | Admitting: Cardiology

## 2023-01-09 ENCOUNTER — Ambulatory Visit: Payer: PPO | Attending: Cardiology | Admitting: Cardiology

## 2023-01-09 VITALS — BP 100/52 | HR 72 | Ht 71.0 in | Wt 181.6 lb

## 2023-01-09 DIAGNOSIS — I2109 ST elevation (STEMI) myocardial infarction involving other coronary artery of anterior wall: Secondary | ICD-10-CM | POA: Diagnosis not present

## 2023-01-09 DIAGNOSIS — I5032 Chronic diastolic (congestive) heart failure: Secondary | ICD-10-CM

## 2023-01-09 DIAGNOSIS — E785 Hyperlipidemia, unspecified: Secondary | ICD-10-CM

## 2023-01-09 DIAGNOSIS — Z7901 Long term (current) use of anticoagulants: Secondary | ICD-10-CM | POA: Diagnosis not present

## 2023-01-09 DIAGNOSIS — I25119 Atherosclerotic heart disease of native coronary artery with unspecified angina pectoris: Secondary | ICD-10-CM

## 2023-01-09 DIAGNOSIS — Z9861 Coronary angioplasty status: Secondary | ICD-10-CM

## 2023-01-09 DIAGNOSIS — I4819 Other persistent atrial fibrillation: Secondary | ICD-10-CM | POA: Diagnosis not present

## 2023-01-09 DIAGNOSIS — I2510 Atherosclerotic heart disease of native coronary artery without angina pectoris: Secondary | ICD-10-CM | POA: Diagnosis not present

## 2023-01-09 NOTE — Assessment & Plan Note (Signed)
CHA2DS2-VASc score is 3 or 4 depending on whether not we given greater for heart failure.  Regardless, he is doing well on DOAC-Eliquis-with no major bleeding issues.  Okay to hold Eliquis for procedures or surgeries; no need for bridging.  Would hold 24 hours for low risk , 48 hours for intermediate risk and 72 hours for high risk procedures. Also okay to hold for 2 to 3 days for significant bleeding or bruising.

## 2023-01-09 NOTE — Assessment & Plan Note (Signed)
Lipids are well-controlled on current dose of statin (atorvastatin 20 mg daily).  Labs are due to be checked in the next month or so by PCP.  Were outstanding as of last July.

## 2023-01-09 NOTE — Assessment & Plan Note (Signed)
Almost 10 years out from anterior STEMI-doing well.  EF improved to about 50%.  No active angina or heart failure symptoms.

## 2023-01-09 NOTE — Assessment & Plan Note (Signed)
Truthfully, I do not think he has heart failure symptoms.  His EF is up to 50% but does have diastolic dysfunction.  NYHA class I symptoms with no PND, orthopnea or edema.  Euvolemic on exam.  Not requiring any diuretic.  Only on modest dose of diltiazem for rate control/BP.

## 2023-01-09 NOTE — Patient Instructions (Signed)
Medication Instructions:  No changes   *If you need a refill on your cardiac medications before your next appointment, please call your pharmacy*   Lab Work: Send lab results over to [facility] from Dr. Laverle Hobby office once complete.  Fax # [phone]  If you have labs (blood work) drawn today and your tests are completely normal, you will receive your results only by: MyChart Message (if you have MyChart) OR A paper copy in the mail If you have any lab test that is abnormal or we need to change your treatment, we will call you to review the results.   Testing/Procedures: None    Follow-Up: At [facility], you and your health needs are our priority.  As part of our continuing mission to provide you with exceptional heart care, we have created designated Provider Care Teams.  These Care Teams include your primary Cardiologist (physician) and Advanced Practice Providers (APPs -  Physician Assistants and Nurse Practitioners) who all work together to provide you with the care you need, when you need it.  We recommend signing up for the patient portal called "MyChart".  Sign up information is provided on this After Visit Summary.  MyChart is used to connect with patients for Virtual Visits (Telemedicine).  Patients are able to view lab/test results, encounter notes, upcoming appointments, etc.  Non-urgent messages can be sent to your provider as well.   To learn more about what you can do with MyChart, go to [email or web address].    Your next appointment:   1 year(s)  The format for your next appointment:   In Person  Provider:   Bryan Lemma, MD

## 2023-01-09 NOTE — Assessment & Plan Note (Signed)
Now just about 10 years out from his MI-LAD PCI still doing remarkably well.  No further anginal or heart failure symptoms.  He is remain on stable regimen with no major issues.  Plan: No longer on aspirin or Plavix as he is on Eliquis for A-fib. Was intolerant to beta-blocker because of fatigue, and not using a STEMI/ARB because of hypotension. He is able to tolerate modest dose of diltiazem 180 mg daily which was added because of A-fib-chosen over amlodipine. Lipids well-controlled on current dose of statin.  Labs are to be checked by PCP in the next month or 2 for follow-up.

## 2023-01-09 NOTE — Progress Notes (Signed)
Cardiology Office Note:  .   Date:  01/09/2023  ID:  Ellsworth Lennox, DOB 1931-07-06, MRN [id number] PCP: Georgann Housekeeper, MD-[facility] Providers Cardiologist:  Bryan Lemma, MD     Chief Complaint  Patient presents with   New Patient (Initial Visit)    annual   Coronary Artery Disease    No angina   Atrial Fibrillation    No breakthrough episodes    Patient Profile: .     [name] is a  89 y.o. male with a PMH noted below who presents here for annual f/u. He is in the process of transitioning PCP from Dr. Merlene Laughter who is now retired to Dr. Tommye Standard not yet seen Dr. Eula Listen.  Marland KitchenCARDIAC HISTORY H/o Anterior STEMI; November 2014 - w/ VT & Afib --? DES PCI of  p-mLAD  Initial mild ICM - resolved with EF 50-55% (06/2014); Unable to use BB or ARB/ACE-I 2/2 Hypotension.;  On modest dose diltiazem HLD - well controlled on mod dose statin Afib recurred Jan 2021: CP admission - Afib. Echo & Myoview ordered. Myoview: EF 50%.  LOW RISK: small/ partially reversible apical defect. No RWMA. / ECHO - EF 50-55% , no RWMA.  Feb 2021 - low dose Diltiazem 60 mg TID --> followed by Lasix 40 mgBID; ->Aug 2021 - changed to Diltiazem XT 180 mg, Lasix PRN AAA (~5.1 cm) - followed by Edwena Felty ->transitioned care to Dr. Lenell Antu - stable finding  Most recent visit 06/04/2021 to discuss follow-up Dopplers showing stable findings of AAA measuring 5.2 cm.  Denies any abdominal or back pain.  No claudication.  Has knee pain issues but nothing significant. => Recommended expectant management with 17-month follow-up.    [name] was last seen on 12/24/2021: - doing well. - no changes  Subjective  [name] returns here today for annual follow-up as usual doing quite well.  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  They enjoy mowing lawns both at home and also for the church grounds as well as members of the church that need to  help with mowing lawns.  He is very active and remains active walking but does not necessarily do routine exercise.  Other than the fact that his hearing is good start to get worse to the point where he has to read lips some, he has been doing relatively well with no major complaints.   No cardiac complaints to speak of.  No angina or heart failure symptoms, he cannot remember the last time he had an A-fib episode.  No CAD no symptoms of PND, orthopnea or edema.  On Eliquis he has some mild bruising but no melena, hematochezia hematuria epistaxis or easy bleeding.  Cardiovascular ROS: no chest pain or dyspnea on exertion negative for - edema, orthopnea, palpitations, paroxysmal nocturnal dyspnea, rapid heart rate, or shortness of breath  ROS:  Review of Systems - Negative except hard of haring - has to read lips.  Brawny skin BLE    Objective   Studies Reviewed: Marland Kitchen   EKG Interpretation Date/Time:  Friday January 09 2023 09:40:08 EDT Ventricular Rate:  72 PR Interval:  160 QRS Duration:  72 QT Interval:  412 QTC Calculation: 451 R Axis:   18  Text Interpretation: Normal sinus rhythm Normal ECG When compared with ECG of 14-Apr-2019 02:00, PREVIOUS ECG IS PRESENT Confirmed by Bryan Lemma ([id number]) on 01/09/2023 10:02:40 AM  Labs to be checked @ next PCP appt. => last recorded labs were from July 2023 with total cholesterol 102, triglycerides 130, HDL 33 and LDL 46. Cr 1.31, K+ 4.3.  Risk Assessment/Calculations:    CHA2DS2-VASc Score = 4   This indicates a 4.8% annual risk of stroke. The patient's score is based upon: CHF History: 1 HTN History: 0 Diabetes History: 0 Stroke History: 0 Vascular Disease History: 1 Age Score: 2 Gender Score: 0          Physical Exam:   VS:  BP (!) 100/52 (BP Location: Left Arm, Patient Position: Sitting, Cuff Size: Normal)   Pulse 72   Ht 5\' 11"  (1.803 m)   Wt 181 lb 9.6 oz (82.4 kg)   SpO2 97%   BMI 25.33 kg/m    Wt Readings from Last 3  Encounters:  01/09/23 181 lb 9.6 oz (82.4 kg)  09/02/22 176 lb 14.4 oz (80.2 kg)  02/18/22 181 lb 11.2 oz (82.4 kg)    GEN: Heathy appearing; Well nourished, well groomed; in no acute distress; NECK: No JVD; No carotid bruits CARDIAC: Normal S1, S2; RRR, no murmurs, rubs, gallops RESPIRATORY:  Clear to auscultation without rales, wheezing or rhonchi ; nonlabored, good air movement. ABDOMEN: Soft, non-tender, non-distended EXTREMITIES:  No edema; No deformity ; mild venous stasis changes - no V veins or edema     ASSESSMENT AND PLAN: .    Problem List Items Addressed This Visit       Cardiology Problems   CAD S/P Prox-Mid LAD; Promus DES 02/10/13 (Chronic)    History of LAD PCI, no longer on antiplatelet agent as he is now on DOAC.      Chronic diastolic CHF (congestive heart failure), NYHA class 2 (HCC) (Chronic)    Truthfully, I do not think he has heart failure symptoms.  His EF is up to 50% but does have diastolic dysfunction.  NYHA class I symptoms with no PND, orthopnea or edema.  Euvolemic on exam.  Not requiring any diuretic.  Only on modest dose of diltiazem for rate control/BP.      Coronary artery disease involving native coronary artery of native heart with angina pectoris (HCC) - Primary (Chronic)    Now just about 10 years out from his MI-LAD PCI still doing remarkably well.  No further anginal or heart failure symptoms.  He is remain on stable regimen with no major issues.  Plan: No longer on aspirin or Plavix as he is on Eliquis for A-fib. Was intolerant to beta-blocker because of fatigue, and not using a STEMI/ARB because of hypotension. He is able to tolerate modest dose of diltiazem 180 mg daily which was added because of A-fib-chosen over amlodipine. Lipids well-controlled on current dose of statin.  Labs are to be checked by PCP in the next month or 2 for follow-up.      Relevant Orders   EKG 12-Lead (Completed)   Hyperlipidemia with target LDL less than  70 (Chronic)    Lipids are well-controlled on current dose of statin (atorvastatin 20 mg daily).  Labs are due to be checked in the next month or so by PCP.  Were outstanding as of last July.      Persistent atrial fibrillation (HCC)-CHA2DS2-VASc score 3-4.  On Eliquis (Chronic)   STEMI of anterior wall - 02/10/13 (Chronic)    Almost 10 years out from anterior STEMI-doing well.  EF improved to about 50%.  No active angina or heart failure symptoms.  Other   Current use of long term anticoagulation: PAF-CHA2DS2-VASc 3 (Chronic)    CHA2DS2-VASc score is 3 or 4 depending on whether not we given greater for heart failure.  Regardless, he is doing well on DOAC-Eliquis-with no major bleeding issues.  Okay to hold Eliquis for procedures or surgeries; no need for bridging.  Would hold 24 hours for low risk , 48 hours for intermediate risk and 72 hours for high risk procedures. Also okay to hold for 2 to 3 days for significant bleeding or bruising.           Dispo: No follow-ups on file.  Total time spent: 20 min spent with patient + 16 min spent charting = 36 min    Signed, Marykay Lex, MD, MS Bryan Lemma, M.D., M.S. Interventional Cardiologist  [facility]  Pager # [phone] Phone # [phone] [address] [place], [place] [address]

## 2023-01-09 NOTE — Assessment & Plan Note (Addendum)
History of LAD PCI, no longer on antiplatelet agent as he is now on DOAC.

## 2023-01-14 DIAGNOSIS — R339 Retention of urine, unspecified: Secondary | ICD-10-CM | POA: Diagnosis not present

## 2023-01-19 ENCOUNTER — Encounter: Payer: Self-pay | Admitting: Podiatry

## 2023-01-19 ENCOUNTER — Ambulatory Visit: Payer: PPO | Admitting: Podiatry

## 2023-01-19 DIAGNOSIS — M79676 Pain in unspecified toe(s): Secondary | ICD-10-CM | POA: Diagnosis not present

## 2023-01-19 DIAGNOSIS — B351 Tinea unguium: Secondary | ICD-10-CM

## 2023-01-19 DIAGNOSIS — D689 Coagulation defect, unspecified: Secondary | ICD-10-CM

## 2023-01-19 NOTE — Progress Notes (Signed)
This patient returns to my office for at risk foot care.  This patient requires this care by a professional since this patient will be at risk due to having coagulation defect.  Patient is taking eliquiss.  This patient is unable to cut nails himself since the patient cannot reach his nails.These nails are painful walking and wearing shoes.  This patient presents for at risk foot care today.   Patient is taking eliquiss.  General Appearance  Alert, conversant and in no acute stress.  Vascular  Dorsalis pedis and posterior tibial  pulses are palpable  bilaterally.  Capillary return is within normal limits  bilaterally. Temperature is within normal limits  bilaterally.  Neurologic  Senn-Weinstein monofilament wire test within normal limits  bilaterally. Muscle power within normal limits bilaterally.  Nails Thick disfigured discolored nails with subungual debris  from hallux to fifth toes bilaterally. No evidence of bacterial infection or drainage bilaterally.  Orthopedic  No limitations of motion  feet .  No crepitus or effusions noted.  No bony pathology or digital deformities noted.  DJD 1st MPJ  B/L.  Skin  normotropic skin with no porokeratosis noted bilaterally.  No signs of infections or ulcers noted.     Onychomycosis  Pain in right toes  Pain in left toes  Consent was obtained for treatment procedures.   Mechanical debridement of nails 1-5  bilaterally performed with a nail nipper.  Filed with dremel without incident.    Return office visit     4 months                Told patient to return for periodic foot care and evaluation due to potential at risk complications.   [name] DPM  

## 2023-02-04 DIAGNOSIS — Z23 Encounter for immunization: Secondary | ICD-10-CM | POA: Diagnosis not present

## 2023-02-04 DIAGNOSIS — I10 Essential (primary) hypertension: Secondary | ICD-10-CM | POA: Diagnosis not present

## 2023-02-04 DIAGNOSIS — R7303 Prediabetes: Secondary | ICD-10-CM | POA: Diagnosis not present

## 2023-02-04 DIAGNOSIS — I4821 Permanent atrial fibrillation: Secondary | ICD-10-CM | POA: Diagnosis not present

## 2023-02-04 DIAGNOSIS — I700 Atherosclerosis of aorta: Secondary | ICD-10-CM | POA: Diagnosis not present

## 2023-02-04 DIAGNOSIS — I5032 Chronic diastolic (congestive) heart failure: Secondary | ICD-10-CM | POA: Diagnosis not present

## 2023-02-04 DIAGNOSIS — H353221 Exudative age-related macular degeneration, left eye, with active choroidal neovascularization: Secondary | ICD-10-CM | POA: Diagnosis not present

## 2023-02-04 DIAGNOSIS — N1831 Chronic kidney disease, stage 3a: Secondary | ICD-10-CM | POA: Diagnosis not present

## 2023-02-04 DIAGNOSIS — J439 Emphysema, unspecified: Secondary | ICD-10-CM | POA: Diagnosis not present

## 2023-02-04 DIAGNOSIS — I25119 Atherosclerotic heart disease of native coronary artery with unspecified angina pectoris: Secondary | ICD-10-CM | POA: Diagnosis not present

## 2023-02-04 DIAGNOSIS — E7800 Pure hypercholesterolemia, unspecified: Secondary | ICD-10-CM | POA: Diagnosis not present

## 2023-02-04 DIAGNOSIS — Z0000 Encounter for general adult medical examination without abnormal findings: Secondary | ICD-10-CM | POA: Diagnosis not present

## 2023-02-16 DIAGNOSIS — I739 Peripheral vascular disease, unspecified: Secondary | ICD-10-CM | POA: Diagnosis not present

## 2023-02-16 DIAGNOSIS — I5032 Chronic diastolic (congestive) heart failure: Secondary | ICD-10-CM | POA: Diagnosis not present

## 2023-02-16 DIAGNOSIS — Z87891 Personal history of nicotine dependence: Secondary | ICD-10-CM | POA: Diagnosis not present

## 2023-02-16 DIAGNOSIS — Z515 Encounter for palliative care: Secondary | ICD-10-CM | POA: Diagnosis not present

## 2023-02-16 DIAGNOSIS — I4891 Unspecified atrial fibrillation: Secondary | ICD-10-CM | POA: Diagnosis not present

## 2023-02-16 DIAGNOSIS — Z6824 Body mass index (BMI) 24.0-24.9, adult: Secondary | ICD-10-CM | POA: Diagnosis not present

## 2023-02-27 DIAGNOSIS — R339 Retention of urine, unspecified: Secondary | ICD-10-CM | POA: Diagnosis not present

## 2023-03-04 DIAGNOSIS — H353221 Exudative age-related macular degeneration, left eye, with active choroidal neovascularization: Secondary | ICD-10-CM | POA: Diagnosis not present
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
## 2023-03-10 ENCOUNTER — Ambulatory Visit: Payer: PPO | Admitting: Vascular Surgery

## 2023-03-10 ENCOUNTER — Ambulatory Visit: Payer: PPO | Admitting: Physician Assistant

## 2023-03-10 ENCOUNTER — Ambulatory Visit (HOSPITAL_COMMUNITY)
Admission: RE | Admit: 2023-03-10 | Discharge: 2023-03-10 | Disposition: A | Discharge status: Home / Self Care | Payer: PPO | Source: Ambulatory Visit | Attending: Vascular Surgery | Admitting: Vascular Surgery

## 2023-03-10 VITALS — BP 136/81 | HR 68 | Temp 98.4°F | Resp 18 | Ht 71.0 in

## 2023-03-10 DIAGNOSIS — I714 Abdominal aortic aneurysm, without rupture, unspecified: Secondary | ICD-10-CM | POA: Insufficient documentation

## 2023-03-12 ENCOUNTER — Other Ambulatory Visit: Payer: Self-pay

## 2023-03-12 DIAGNOSIS — I714 Abdominal aortic aneurysm, without rupture, unspecified: Secondary | ICD-10-CM

## 2023-03-23 ENCOUNTER — Other Ambulatory Visit: Payer: Self-pay | Admitting: Cardiology

## 2023-03-23 DIAGNOSIS — I4819 Other persistent atrial fibrillation: Secondary | ICD-10-CM

## 2023-03-23 NOTE — Telephone Encounter (Signed)
Received faxed lab results, last labs 02/04/23 Creat 1.22, age 89, weight 82.4kg, based on specified criteria pt is on appropriate dosage of Eliquis 5mg  BID for afib.  Will refill rx.

## 2023-03-23 NOTE — Telephone Encounter (Addendum)
Pt last saw Dr Herbie Baltimore 01/09/23, last labs per Dr [facility] 02/04/23, but unable to see values.  Called [facility]'s PCP and requested labs.  Will await fax to assess refill further.

## 2023-03-25 DIAGNOSIS — R339 Retention of urine, unspecified: Secondary | ICD-10-CM | POA: Diagnosis not present

## 2023-05-22 ENCOUNTER — Ambulatory Visit: Payer: PPO | Admitting: Podiatry

## 2023-05-22 ENCOUNTER — Encounter: Payer: Self-pay | Admitting: Podiatry

## 2023-05-22 DIAGNOSIS — D689 Coagulation defect, unspecified: Secondary | ICD-10-CM | POA: Diagnosis not present

## 2023-05-22 DIAGNOSIS — M79676 Pain in unspecified toe(s): Secondary | ICD-10-CM

## 2023-05-22 DIAGNOSIS — B351 Tinea unguium: Secondary | ICD-10-CM | POA: Diagnosis not present

## 2023-05-22 NOTE — Progress Notes (Signed)
This patient returns to my office for at risk foot care.  This patient requires this care by a professional since this patient will be at risk due to having coagulation defect.  Patient is taking eliquiss.  This patient is unable to cut nails himself since the patient cannot reach his nails.These nails are painful walking and wearing shoes.  This patient presents for at risk foot care today.   Patient is taking eliquiss.  General Appearance  Alert, conversant and in no acute stress.  Vascular  Dorsalis pedis and posterior tibial  pulses are palpable  bilaterally.  Capillary return is within normal limits  bilaterally. Temperature is within normal limits  bilaterally.  Neurologic  Senn-Weinstein monofilament wire test within normal limits  bilaterally. Muscle power within normal limits bilaterally.  Nails Thick disfigured discolored nails with subungual debris  from hallux to fifth toes bilaterally. No evidence of bacterial infection or drainage bilaterally.  Orthopedic  No limitations of motion  feet .  No crepitus or effusions noted.  No bony pathology or digital deformities noted.  DJD 1st MPJ  B/L.  Skin  normotropic skin with no porokeratosis noted bilaterally.  No signs of infections or ulcers noted.     Onychomycosis  Pain in right toes  Pain in left toes  Consent was obtained for treatment procedures.   Mechanical debridement of nails 1-5  bilaterally performed with a nail nipper.  Filed with dremel without incident.    Return office visit     4 months                Told patient to return for periodic foot care and evaluation due to potential at risk complications.   [name] DPM  

## 2023-05-25 ENCOUNTER — Other Ambulatory Visit: Payer: Self-pay | Admitting: Cardiology

## 2023-06-03 DIAGNOSIS — H353221 Exudative age-related macular degeneration, left eye, with active choroidal neovascularization: Secondary | ICD-10-CM | POA: Diagnosis not present

## 2023-07-03 DIAGNOSIS — R051 Acute cough: Secondary | ICD-10-CM | POA: Diagnosis not present

## 2023-07-03 DIAGNOSIS — J189 Pneumonia, unspecified organism: Secondary | ICD-10-CM | POA: Diagnosis not present

## 2023-07-03 DIAGNOSIS — R509 Fever, unspecified: Secondary | ICD-10-CM | POA: Diagnosis not present

## 2023-07-03 DIAGNOSIS — R0600 Dyspnea, unspecified: Secondary | ICD-10-CM | POA: Diagnosis not present

## 2023-07-20 DIAGNOSIS — J309 Allergic rhinitis, unspecified: Secondary | ICD-10-CM | POA: Diagnosis not present

## 2023-07-20 DIAGNOSIS — W01198A Fall on same level from slipping, tripping and stumbling with subsequent striking against other object, initial encounter: Secondary | ICD-10-CM | POA: Diagnosis not present

## 2023-07-20 DIAGNOSIS — R0782 Intercostal pain: Secondary | ICD-10-CM | POA: Diagnosis not present

## 2023-07-22 DIAGNOSIS — R339 Retention of urine, unspecified: Secondary | ICD-10-CM | POA: Diagnosis not present

## 2023-07-27 DIAGNOSIS — H353221 Exudative age-related macular degeneration, left eye, with active choroidal neovascularization: Secondary | ICD-10-CM | POA: Diagnosis not present

## 2023-09-04 DIAGNOSIS — R339 Retention of urine, unspecified: Secondary | ICD-10-CM | POA: Diagnosis not present

## 2023-09-07 DIAGNOSIS — D6869 Other thrombophilia: Secondary | ICD-10-CM | POA: Diagnosis not present

## 2023-09-07 DIAGNOSIS — R7303 Prediabetes: Secondary | ICD-10-CM | POA: Diagnosis not present

## 2023-09-07 DIAGNOSIS — I5032 Chronic diastolic (congestive) heart failure: Secondary | ICD-10-CM | POA: Diagnosis not present

## 2023-09-07 DIAGNOSIS — N400 Enlarged prostate without lower urinary tract symptoms: Secondary | ICD-10-CM | POA: Diagnosis not present

## 2023-09-07 DIAGNOSIS — E7800 Pure hypercholesterolemia, unspecified: Secondary | ICD-10-CM | POA: Diagnosis not present

## 2023-09-07 DIAGNOSIS — I10 Essential (primary) hypertension: Secondary | ICD-10-CM | POA: Diagnosis not present

## 2023-09-07 DIAGNOSIS — I714 Abdominal aortic aneurysm, without rupture, unspecified: Secondary | ICD-10-CM | POA: Diagnosis not present

## 2023-09-07 DIAGNOSIS — N1831 Chronic kidney disease, stage 3a: Secondary | ICD-10-CM | POA: Diagnosis not present

## 2023-09-07 DIAGNOSIS — Z0000 Encounter for general adult medical examination without abnormal findings: Secondary | ICD-10-CM | POA: Diagnosis not present

## 2023-09-07 DIAGNOSIS — J439 Emphysema, unspecified: Secondary | ICD-10-CM | POA: Diagnosis not present

## 2023-09-07 DIAGNOSIS — I25119 Atherosclerotic heart disease of native coronary artery with unspecified angina pectoris: Secondary | ICD-10-CM | POA: Diagnosis not present

## 2023-09-07 DIAGNOSIS — I4821 Permanent atrial fibrillation: Secondary | ICD-10-CM | POA: Diagnosis not present

## 2023-09-08 ENCOUNTER — Ambulatory Visit: Payer: PPO

## 2023-09-08 ENCOUNTER — Ambulatory Visit (HOSPITAL_COMMUNITY): Payer: PPO

## 2023-09-21 ENCOUNTER — Encounter: Payer: Self-pay | Admitting: Podiatry

## 2023-09-21 ENCOUNTER — Ambulatory Visit: Payer: PPO | Admitting: Podiatry

## 2023-09-21 DIAGNOSIS — B351 Tinea unguium: Secondary | ICD-10-CM

## 2023-09-21 DIAGNOSIS — M79676 Pain in unspecified toe(s): Secondary | ICD-10-CM

## 2023-09-21 DIAGNOSIS — D689 Coagulation defect, unspecified: Secondary | ICD-10-CM | POA: Diagnosis not present

## 2023-09-21 NOTE — Progress Notes (Signed)
This patient returns to my office for at risk foot care.  This patient requires this care by a professional since this patient will be at risk due to having coagulation defect.  Patient is taking eliquiss.  This patient is unable to cut nails himself since the patient cannot reach his nails.These nails are painful walking and wearing shoes.  This patient presents for at risk foot care today.   Patient is taking eliquiss.  General Appearance  Alert, conversant and in no acute stress.  Vascular  Dorsalis pedis and posterior tibial  pulses are palpable  bilaterally.  Capillary return is within normal limits  bilaterally. Temperature is within normal limits  bilaterally.  Neurologic  Senn-Weinstein monofilament wire test within normal limits  bilaterally. Muscle power within normal limits bilaterally.  Nails Thick disfigured discolored nails with subungual debris  from hallux to fifth toes bilaterally. No evidence of bacterial infection or drainage bilaterally.  Orthopedic  No limitations of motion  feet .  No crepitus or effusions noted.  No bony pathology or digital deformities noted.  DJD 1st MPJ  B/L.  Skin  normotropic skin with no porokeratosis noted bilaterally.  No signs of infections or ulcers noted.     Onychomycosis  Pain in right toes  Pain in left toes  Consent was obtained for treatment procedures.   Mechanical debridement of nails 1-5  bilaterally performed with a nail nipper.  Filed with dremel without incident.    Return office visit     4 months                Told patient to return for periodic foot care and evaluation due to potential at risk complications.   [name] DPM  

## 2023-10-12 DIAGNOSIS — R339 Retention of urine, unspecified: Secondary | ICD-10-CM | POA: Diagnosis not present

## 2023-10-27 ENCOUNTER — Ambulatory Visit: Admitting: Physician Assistant

## 2023-10-27 ENCOUNTER — Ambulatory Visit (HOSPITAL_COMMUNITY)
Admission: RE | Admit: 2023-10-27 | Discharge: 2023-10-27 | Disposition: A | Discharge status: Home / Self Care | Source: Ambulatory Visit | Attending: Vascular Surgery | Admitting: Vascular Surgery

## 2023-10-27 VITALS — BP 131/73 | HR 71 | Temp 97.8°F | Wt 166.0 lb

## 2023-10-27 DIAGNOSIS — I714 Abdominal aortic aneurysm, without rupture, unspecified: Secondary | ICD-10-CM | POA: Diagnosis not present
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
## 2023-10-28 ENCOUNTER — Other Ambulatory Visit: Payer: Self-pay

## 2023-10-28 DIAGNOSIS — I714 Abdominal aortic aneurysm, without rupture, unspecified: Secondary | ICD-10-CM

## 2023-11-05 ENCOUNTER — Other Ambulatory Visit: Payer: Self-pay | Admitting: Cardiology

## 2023-11-05 DIAGNOSIS — I4819 Other persistent atrial fibrillation: Secondary | ICD-10-CM

## 2023-11-05 NOTE — Telephone Encounter (Addendum)
 Prescription refill request for Eliquis  received. Indication: AFIB  Last office visit: 01/09/23 [facility])  Scr: 1.32 (09/08/23 via [facility])  Age: 89 Weight: 75.3kg   Appropriate dose. Refill sent.

## 2023-11-05 NOTE — Telephone Encounter (Signed)
 Refill Request.

## 2023-11-18 DIAGNOSIS — I5032 Chronic diastolic (congestive) heart failure: Secondary | ICD-10-CM | POA: Diagnosis not present

## 2023-11-18 DIAGNOSIS — I10 Essential (primary) hypertension: Secondary | ICD-10-CM | POA: Diagnosis not present

## 2023-11-18 DIAGNOSIS — R531 Weakness: Secondary | ICD-10-CM | POA: Diagnosis not present

## 2023-11-18 DIAGNOSIS — I4821 Permanent atrial fibrillation: Secondary | ICD-10-CM | POA: Diagnosis not present

## 2023-11-18 DIAGNOSIS — H353221 Exudative age-related macular degeneration, left eye, with active choroidal neovascularization: Secondary | ICD-10-CM | POA: Diagnosis not present

## 2023-11-18 DIAGNOSIS — R269 Unspecified abnormalities of gait and mobility: Secondary | ICD-10-CM | POA: Diagnosis not present

## 2023-11-18 DIAGNOSIS — J439 Emphysema, unspecified: Secondary | ICD-10-CM | POA: Diagnosis not present

## 2023-11-18 DIAGNOSIS — R634 Abnormal weight loss: Secondary | ICD-10-CM | POA: Diagnosis not present

## 2023-11-23 DIAGNOSIS — R252 Cramp and spasm: Secondary | ICD-10-CM | POA: Diagnosis not present

## 2023-11-23 DIAGNOSIS — M79605 Pain in left leg: Secondary | ICD-10-CM | POA: Diagnosis not present

## 2023-12-21 NOTE — Telephone Encounter (Signed)
 I left messages on pt home and cell phones. I was returning a call per answering service that pt needed an appointment.

## 2023-12-21 NOTE — Telephone Encounter (Signed)
 error

## 2024-01-18 ENCOUNTER — Encounter: Payer: Self-pay | Admitting: Podiatry

## 2024-01-18 ENCOUNTER — Ambulatory Visit: Admitting: Podiatry

## 2024-01-18 DIAGNOSIS — M79676 Pain in unspecified toe(s): Secondary | ICD-10-CM | POA: Diagnosis not present

## 2024-01-18 DIAGNOSIS — D689 Coagulation defect, unspecified: Secondary | ICD-10-CM

## 2024-01-18 DIAGNOSIS — B351 Tinea unguium: Secondary | ICD-10-CM

## 2024-01-18 NOTE — Progress Notes (Signed)
 This patient returns to my office for at risk foot care.  This patient requires this care by a professional since this patient will be at risk due to having coagulation defect.  Patient is taking eliquiss.  This patient is unable to cut nails himself since the patient cannot reach his nails.These nails are painful walking and wearing shoes.  This patient presents for at risk foot care today.   Patient is taking eliquiss.  General Appearance  Alert, conversant and in no acute stress.  Vascular  Dorsalis pedis and posterior tibial  pulses are palpable  bilaterally.  Capillary return is within normal limits  bilaterally. Temperature is within normal limits  bilaterally.  Neurologic  Senn-Weinstein monofilament wire test within normal limits  bilaterally. Muscle power within normal limits bilaterally.  Nails Thick disfigured discolored nails with subungual debris  from hallux to fifth toes bilaterally. No evidence of bacterial infection or drainage bilaterally.  Orthopedic  No limitations of motion  feet .  No crepitus or effusions noted.  No bony pathology or digital deformities noted.  DJD 1st MPJ  B/L.  Skin  normotropic skin with no porokeratosis noted bilaterally.  No signs of infections or ulcers noted.     Onychomycosis  Pain in right toes  Pain in left toes  Consent was obtained for treatment procedures.   Mechanical debridement of nails 1-5  bilaterally performed with a nail nipper.  Filed with dremel without incident.    Return office visit     3 months                Told patient to return for periodic foot care and evaluation due to potential at risk complications.   Cordella Bold DPM

## 2024-02-24 ENCOUNTER — Other Ambulatory Visit: Payer: Self-pay | Admitting: Cardiology

## 2024-03-23 ENCOUNTER — Other Ambulatory Visit: Payer: Self-pay | Admitting: Cardiology

## 2024-04-04 ENCOUNTER — Other Ambulatory Visit: Payer: Self-pay

## 2024-04-04 DIAGNOSIS — I714 Abdominal aortic aneurysm, without rupture, unspecified: Secondary | ICD-10-CM

## 2024-04-07 ENCOUNTER — Other Ambulatory Visit: Payer: Self-pay | Admitting: Cardiology

## 2024-04-08 NOTE — Telephone Encounter (Signed)
 In accordance with refill protocols, please review and address the following requirements before this medication refill can be authorized:  Labs

## 2024-04-10 NOTE — Telephone Encounter (Signed)
 Okay to refill.

## 2024-04-18 ENCOUNTER — Ambulatory Visit: Admitting: Podiatry

## 2024-04-25 ENCOUNTER — Ambulatory Visit

## 2024-04-25 ENCOUNTER — Other Ambulatory Visit (HOSPITAL_COMMUNITY)

## 2024-04-26 ENCOUNTER — Ambulatory Visit

## 2024-04-26 ENCOUNTER — Ambulatory Visit (HOSPITAL_COMMUNITY)

## 2024-05-04 ENCOUNTER — Ambulatory Visit: Admitting: Podiatry

## 2024-06-20 ENCOUNTER — Ambulatory Visit

## 2024-06-20 ENCOUNTER — Ambulatory Visit (HOSPITAL_COMMUNITY)
